# Patient Record
Sex: Female | Born: 1985 | Race: Black or African American | Hispanic: No | Marital: Married | Smoking: Former smoker
Health system: Southern US, Community
[De-identification: ages and names within clinical notes are randomized; demographics above are authoritative.]

## PROBLEM LIST (undated history)

## (undated) ENCOUNTER — Inpatient Hospital Stay (HOSPITAL_COMMUNITY): Payer: Self-pay

## (undated) DIAGNOSIS — O9989 Other specified diseases and conditions complicating pregnancy, childbirth and the puerperium: Secondary | ICD-10-CM

## (undated) DIAGNOSIS — F4310 Post-traumatic stress disorder, unspecified: Secondary | ICD-10-CM

## (undated) DIAGNOSIS — N390 Urinary tract infection, site not specified: Secondary | ICD-10-CM

## (undated) DIAGNOSIS — E876 Hypokalemia: Secondary | ICD-10-CM

## (undated) DIAGNOSIS — D649 Anemia, unspecified: Secondary | ICD-10-CM

## (undated) DIAGNOSIS — B9689 Other specified bacterial agents as the cause of diseases classified elsewhere: Secondary | ICD-10-CM

## (undated) DIAGNOSIS — F32A Depression, unspecified: Secondary | ICD-10-CM

## (undated) DIAGNOSIS — O99891 Other specified diseases and conditions complicating pregnancy: Secondary | ICD-10-CM

## (undated) DIAGNOSIS — F329 Major depressive disorder, single episode, unspecified: Secondary | ICD-10-CM

## (undated) DIAGNOSIS — A6000 Herpesviral infection of urogenital system, unspecified: Secondary | ICD-10-CM

## (undated) DIAGNOSIS — F419 Anxiety disorder, unspecified: Secondary | ICD-10-CM

## (undated) DIAGNOSIS — Z8659 Personal history of other mental and behavioral disorders: Secondary | ICD-10-CM

## (undated) DIAGNOSIS — N760 Acute vaginitis: Secondary | ICD-10-CM

## (undated) HISTORY — DX: Herpesviral infection of urogenital system, unspecified: A60.00

## (undated) HISTORY — DX: Personal history of other mental and behavioral disorders: Z86.59

## (undated) HISTORY — DX: Anxiety disorder, unspecified: F41.9

## (undated) HISTORY — DX: Post-traumatic stress disorder, unspecified: F43.10

## (undated) HISTORY — DX: Other specified diseases and conditions complicating pregnancy, childbirth and the puerperium: O99.89

## (undated) HISTORY — PX: RHINOPLASTY: SUR1284

## (undated) HISTORY — DX: Depression, unspecified: F32.A

## (undated) HISTORY — DX: Other specified diseases and conditions complicating pregnancy: O99.891

## (undated) HISTORY — DX: Major depressive disorder, single episode, unspecified: F32.9

## (undated) HISTORY — PX: MOUTH SURGERY: SHX715

---
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
## 2006-02-26 ENCOUNTER — Ambulatory Visit (HOSPITAL_BASED_OUTPATIENT_CLINIC_OR_DEPARTMENT_OTHER): Admission: RE | Admit: 2006-02-26 | Discharge: 2006-02-26 | Payer: Self-pay | Admitting: Otolaryngology

## 2008-07-30 ENCOUNTER — Emergency Department (HOSPITAL_BASED_OUTPATIENT_CLINIC_OR_DEPARTMENT_OTHER): Admission: EM | Admit: 2008-07-30 | Discharge: 2008-07-30 | Payer: Self-pay | Admitting: Emergency Medicine

## 2008-08-08 ENCOUNTER — Encounter: Admission: RE | Admit: 2008-08-08 | Discharge: 2008-08-08 | Payer: Self-pay | Admitting: Gastroenterology

## 2009-12-11 ENCOUNTER — Inpatient Hospital Stay (HOSPITAL_COMMUNITY): Admission: AD | Admit: 2009-12-11 | Discharge: 2009-12-11 | Payer: Self-pay | Admitting: Obstetrics and Gynecology

## 2010-01-11 ENCOUNTER — Inpatient Hospital Stay (HOSPITAL_COMMUNITY): Admission: AD | Admit: 2010-01-11 | Discharge: 2010-01-12 | Payer: Self-pay | Admitting: Obstetrics and Gynecology

## 2010-04-29 LAB — CBC
MCHC: 33.4 g/dL (ref 30.0–36.0)
MCV: 85.1 fL (ref 78.0–100.0)
Platelets: 191 10*3/uL (ref 150–400)
RBC: 4.23 MIL/uL (ref 3.87–5.11)
RDW: 13.8 % (ref 11.5–15.5)
WBC: 7 10*3/uL (ref 4.0–10.5)

## 2010-04-29 LAB — URINALYSIS, ROUTINE W REFLEX MICROSCOPIC
Ketones, ur: NEGATIVE mg/dL
Protein, ur: NEGATIVE mg/dL
Specific Gravity, Urine: 1.025 (ref 1.005–1.030)
pH: 6 (ref 5.0–8.0)

## 2010-04-29 LAB — URINE MICROSCOPIC-ADD ON

## 2010-04-30 LAB — URINE CULTURE

## 2010-04-30 LAB — URINALYSIS, ROUTINE W REFLEX MICROSCOPIC
Bilirubin Urine: NEGATIVE
Nitrite: NEGATIVE
Specific Gravity, Urine: 1.02 (ref 1.005–1.030)
Urobilinogen, UA: 0.2 mg/dL (ref 0.0–1.0)

## 2010-04-30 LAB — URINE MICROSCOPIC-ADD ON

## 2010-05-26 LAB — URINE MICROSCOPIC-ADD ON

## 2010-05-26 LAB — COMPREHENSIVE METABOLIC PANEL
Albumin: 4 g/dL (ref 3.5–5.2)
Alkaline Phosphatase: 83 U/L (ref 39–117)
Chloride: 108 mEq/L (ref 96–112)
Creatinine, Ser: 0.8 mg/dL (ref 0.4–1.2)
GFR calc Af Amer: >60 mL/min (ref >60)
GFR calc non Af Amer: >60 mL/min (ref >60)
Glucose, Bld: 86 mg/dL (ref 70–99)
Sodium: 143 mEq/L (ref 135–145)

## 2010-05-26 LAB — DIFFERENTIAL
Eosinophils Relative: 5 % (ref 0–5)
Lymphs Abs: 0.8 10*3/uL (ref 0.7–4.0)
Monocytes Absolute: 0.4 10*3/uL (ref 0.1–1.0)
Neutro Abs: 4.3 10*3/uL (ref 1.7–7.7)

## 2010-05-26 LAB — URINALYSIS, ROUTINE W REFLEX MICROSCOPIC
Glucose, UA: NEGATIVE mg/dL
Nitrite: NEGATIVE
pH: 6.5 (ref 5.0–8.0)

## 2010-05-26 LAB — CBC
HCT: 39.2 % (ref 36.0–46.0)
MCV: 81.4 fL (ref 78.0–100.0)
RBC: 4.82 MIL/uL (ref 3.87–5.11)
RDW: 13.5 % (ref 11.5–15.5)

## 2010-05-26 LAB — LIPASE, BLOOD: Lipase: 71 U/L (ref 23–300)

## 2010-05-26 LAB — URINE CULTURE

## 2010-06-26 ENCOUNTER — Inpatient Hospital Stay (HOSPITAL_COMMUNITY)
Admission: AD | Admit: 2010-06-26 | Discharge: 2010-06-28 | DRG: 775 | Disposition: A | Discharge status: Home / Self Care | Payer: PRIVATE HEALTH INSURANCE | Source: Ambulatory Visit | Attending: Obstetrics and Gynecology | Admitting: Obstetrics and Gynecology

## 2010-06-26 ENCOUNTER — Inpatient Hospital Stay (HOSPITAL_COMMUNITY)
Admission: AD | Admit: 2010-06-26 | Payer: PRIVATE HEALTH INSURANCE | Source: Ambulatory Visit | Admitting: Obstetrics and Gynecology

## 2010-06-26 LAB — CBC
MCH: 24.7 pg — ABNORMAL LOW (ref 26.0–34.0)
MCV: 76.5 fL — ABNORMAL LOW (ref 78.0–100.0)
Platelets: 240 10*3/uL (ref 150–400)
RBC: 4.13 MIL/uL (ref 3.87–5.11)
RDW: 14.1 % (ref 11.5–15.5)

## 2010-06-27 LAB — CBC
MCHC: 32 g/dL (ref 30.0–36.0)
Platelets: 213 10*3/uL (ref 150–400)
RDW: 14.2 % (ref 11.5–15.5)

## 2010-07-04 NOTE — Op Note (Signed)
[name], [name]             ACCOUNT NO.:  [id number]   MEDICAL RECORD NO.:  [id number]          PATIENT TYPE:  AMB   LOCATION:  DSC                          FACILITY:  [facility]   PHYSICIAN:  Christopher E. Ezzard Standing, M.D.DATE OF BIRTH:  02-04-1986   DATE OF PROCEDURE:  DATE OF DISCHARGE:  02/26/2006                               OPERATIVE REPORT   PREOPERATIVE DIAGNOSIS:  Septal deviation with nasal obstruction.   POSTOPERATIVE DIAGNOSIS:  Septal deviation with nasal obstruction.   OPERATION:  Septoplasty and bilateral inferior turbinate reductions.   SURGEON:  Dillard Cannon, MD   ANESTHESIA:  General endotracheal.   COMPLICATIONS:  None.   BRIEF CLINICAL NOTE:  Breanna [name] is a 25 year old who has had  chronic problems with nasal obstruction, left side generally worse than  right.  On exam she has deviated septum and moderate-sized turbinates  causing obstruction, generally worse on the left side.  She was taken to  the operating room and signed for septoplasty and turbinate reductions.   PROCEDURE:  After adequate general endotracheal anesthesia, the nose was  prepped and draped out with sterile towels.  The nose was then further  prepped with cotton pledgets soaked in decongestant and then the septum  and the turbinates were injected with Xylocaine with epinephrine.  A  hemitransfixion incision was made along the caudal edge of the septum on  the right side.  Mucoperichondrial and mucoperiosteal flaps were  elevated posteriorly.  The portion of the caudal edges of the bony  septum were protruded to the left side and were shifted back to middle  after resecting the inferior portion of the caudal anteroseptum off of  the maxillary crest.  In addition, bony spur was removed from the  posterior left side.  This completed the septoplasty portion of the  procedure.  Hemitransfixion incision was closed with 3-0 chromic sutures  and the septum was __________ with a 3-0  chromic suture.  Next, inferior  turbinate reductions were performed by amputating the lower one-third of  turbinate, mucosa, and tissue.  Additional turbinate bone was removed  and then suction cautery was used for hemostasis.  The nose was packed  with Telfa soaked in bacitracin ointment bilaterally.  The patient was  subsequently awoke from anesthesia and transferred to the recovery doing  well.  The patient received 1 gm of Ancef IV preoperatively.   DISPOSITION:  [name] is discharged home later this morning on Keflex 500  mg b.i.d. for one week, Tylenol and Vicodin p.r.n. pain.  Will have her  follow up in my office tomorrow to have her nasal packs removed.           ______________________________  Kristine Garbe Ezzard Standing, M.D.    CEN/MEDQ  D:  03/24/2006  T:  03/25/2006  Job:  [id number]

## 2010-07-04 NOTE — H&P (Signed)
NAME:  Breanna [name], Breanna [name] NO.:  [id number]   MEDICAL RECORD NO.:  [id number]           PATIENT TYPE:   LOCATION:                                 FACILITY:   PHYSICIAN:  Juluis Mire, M.D.        DATE OF BIRTH:   DATE OF ADMISSION:  DATE OF DISCHARGE:                                HISTORY & PHYSICAL   HISTORY:  The patient is an 25 year old prima gravida female with her last  menstrual period October 15, 2003, giving her an estimated date of  confinement of July 21, 2004.  This gives her an estimated gestational age of  [redacted] weeks.  However, by early ultrasound she has an estimated date of  confinement of August 02, 2004 and this gives her an estimated gestational age  of [redacted] weeks and 3 days.   In relation of the present admission the patient was seen in the office.  She had a history of diarrhea and vomiting for two weeks.  She was unable to  keep bland foods down.  She has also had some severe back pain over the  weekend.  She had been using Darvocet.  It was questioned whether she was  having uterine activity.  Dr. Henderson Cloud checked her.  Her cervix was closed  but it was very soft and she had a tight lower uterine segment.  She was  sent to triage for an ultrasound, as well as nonstress testing and  monitoring.  During the ultrasound it was noted that she had an amniotic  fluid index of 7.4, which was low.  Biophysical profile was 8/8.  She had  normal Doppler flow studies.  The growth did reveal some questionable  asymmetrical growth retardation.  Her cervical length was fairly normal by  ultrasound.   On monitoring she had a reactive nonstress test and there was no evidence of  uterine activity.  No leaking was specifically noted.  In view of the  decreased amniotic fluid and the question of asymmetric growth retardation,  the patient is now admitted for further management.  This will include  hospitalization with relative bedrest and hydration.   ALLERGIES:  No  known drug allergies.   MEDICATIONS:  Include prenatal vitamins.   PAST MEDICAL HISTORY:   FAMILY HISTORY:   SOCIAL HISTORY:  Please see the prenatal records.   REVIEW OF SYSTEMS:  Noncontributory.   PHYSICAL EXAMINATION:  VITAL SIGNS:  The patient is afebrile.  Blood  pressure 100/54.  The vital signs are stable.  LUNGS:  Clear.  CARDIAC:  Regular rhythm and rate without murmurs or gallops.  ABDOMEN:  Gravid uterus with a fundal height of 32 cm.  Fetal heart tones  are audible.  PELVIC:  In the office the cervix was closed.  Very soft.  She had a soft  lower uterine segment.  The cervix was posterior.  Evidently, by Dr.  Huel Coventry check, no amniotic fluid leakage was noted.  EXTREMITIES:  Trace edema.  NEUROLOGIC:  Grossly within normal limits.  Deep tendon reflexes 2+ and no  clonus.   IMPRESSION:  Intrauterine pregnancy between 32-34 weeks with decreased  amniotic fluid volume and questionable asymmetric growth retardation.   PLAN:  The patient will be admitted for IV hydration.  We will do bedrest  with bathroom privileges.  Continuous monitoring will be undertaken.  A  repeat biophysical profile and amniotic fluid volume will be reassessed  tomorrow.      JSM/MEDQ  D:  06/10/2004  T:  06/10/2004  Job:  [id number]

## 2010-07-04 NOTE — Op Note (Signed)
Breanna [name], Breanna [name]             ACCOUNT NO.:  [id number]   MEDICAL RECORD NO.:  [id number]          PATIENT TYPE:  AMB   LOCATION:  DSC                          FACILITY:  [facility]   PHYSICIAN:  Christopher E. Ezzard Standing, M.D.DATE OF BIRTH:  May 18, 1985   DATE OF PROCEDURE:  02/26/2006  DATE OF DISCHARGE:                               OPERATIVE REPORT   PREOPERATIVE DIAGNOSES:  1. 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  2. Turbinate hypertrophy.   POSTOPERATIVE DIAGNOSES:  1. 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  2. Turbinate hypertrophy.   OPERATION:  Septoplasty with bilateral inferior turbinate reductions.   SURGEON:  Kristine Garbe. Ezzard Standing, M.D.   ANESTHESIA:  General endotracheal.   COMPLICATIONS:  None.   CLINICAL NOTE:  Breanna [name] is a 25 year old who has had a chronic problem  with nasal obstruction, the left side much worse than the right.  On  examination she has almost total blockage of the left nasal passageway  with compensatory turbinate hypertrophy of the right inferior turbinate.  She has a severe bony as well as cartilaginous deviation of the septum  to left with the caudal end of the cartilaginous septum bone into the  right nasal passageway.  She is taken to the operating room at this time  for septoplasty and turbinate reductions.  The left airway is generally  much worse than her right.   DESCRIPTION OF PROCEDURE:  After adequate endotracheal anesthesia, the  patient received 1 g of Ancef intraoperatively.  The nose was prepped  with Betadine solution and draped out with sterile towels.  A  hemitransfixion incision was made along the caudal edge of septum on the  right side.  Mucoperiosteal and mucoperichondrial flaps were elevated on  the left side of the septum as the cartilaginous septum was off of the  crest into the left airway and at the junction of the bony cartilaginous  septum, the bony septum was severely deviated to the left.  After  removing the strip of cartilage off of the maxillary crest inferiorly  and separating the cartilaginous septum from the bony septum, the  anterior cartilaginous septum straightened up reasonably well.  The bony  septum that protruded into the left airway was removed.  There was a  large spur or crest posteriorly on the left side, and this was likewise  removed.  This allowed the septum to return much more toward midline and  open up the left nasal passageway.  Because of the compensatory  turbinate urgency of the right inferior turbinate, this had to be  reduced.  An incision was made along the inferior edge of the turbinate.  Mucosa was elevated off of the turbinate bone and the turbinate bone was  removed, submucosal cauterization was performed, and the remaining  turbinate was outfractured.  On the left side just the inferior one-  third of the turbinate was amputated and remaining turbinate was  submucosally cauterized with bipolar cautery.  This completed the  turbinate reductions.  The hemitransfixion incision was  closed with  interrupted 4-0 chromic sutures.  The septum was basted with a 3-0  chromic suture and splints were secured to either side of the septum  with a 3-0 nylon suture.  The nose was then packed with Telfa soaked in  bacitracin ointment.  Having tolerated this well, was awoken from  anesthesia and transferred to the recovery room postop doing well.   DISPOSITION:  Breanna [name] is discharged home later this morning on Tylenol  and Vicodin p.r.n. pain, Keflex 500 mg b.i.d. for 1 week.  We will have  her follow up in my office Monday to have the nasal packs removed.           ______________________________  Kristine Garbe Ezzard Standing, M.D.     CEN/MEDQ  D:  02/26/2006  T:  02/26/2006  Job:  [id number]

## 2010-07-04 NOTE — Discharge Summary (Signed)
NAMESIMCHA, [name]             ACCOUNT NO.:  [id number]   MEDICAL RECORD NO.:  [id number]          PATIENT TYPE:  INP   LOCATION:  9158                          FACILITY:  [facility]   PHYSICIAN:  Freddy Finner, M.D.   DATE OF BIRTH:  05-27-1985   DATE OF ADMISSION:  06/10/2004  DATE OF DISCHARGE:  06/11/2004                                 DISCHARGE SUMMARY   ADMITTING DIAGNOSES:  1.  Intrauterine pregnancy at 49 weeks estimated gestational age.  2.  Viral syndrome.  3.  Decreased amniotic fluid index.  4.  Questionable intrauterine growth retardation with asymmetrical growth.   DISCHARGE DIAGNOSES:  1.  Intrauterine pregnancy at 47 weeks estimated gestational age.  2.  Viral syndrome, resolved.   REASON FOR ADMISSION:  Please see dictated H&P.   HOSPITAL COURSE:  The patient was an 25 year old primigravida black female  that was admitted to [facility] for observation. The  patient had been seen in the office on the day of admission with a history  of diarrhea and vomiting for approximately 2 weeks. The patient had been  unable to keep down bland foods. The patient also had a complaint of some  severe back pain over the weekend. It was questioned as to whether or not  she was experiencing some uterine activity. Cervix was examined which was  noted to be closed; however, it was very soft and she had a tight lower  uterine segment. The patient was sent to maternity admissions for further  observation and testing. During the ultrasound, it was noted that the  patient had decreased amniotic fluid with an AFI of 7.4. Biophysical profile  was 8/8. She had normal Doppler flow studies. The growth also had revealed  some questionable asymmetric growth retardation. Cervix was noted to have  good length by ultrasound. On monitoring, the patient did have a reactive  nonstress test without evidence of uterine activity. There was no leaking of  amniotic fluid. The decision was  made to hospitalize the patient overnight  with further testing and observation. The patient was started on IV  hydration and bedrest. She was scheduled for repeat ultrasound the following  morning and amniotic fluid volume to be reassessed. On the following morning  it was determined that fetal heart tones were reactive. Biophysical profile  was 8/8. There was an increase in the amniotic fluid index greater than 5%.  Decision was made to discharge the patient with a follow-up in the office in  approximately 1 week.   CONDITION ON DISCHARGE:  Stable.   DIET:  Regular as tolerated.   ACTIVITY:  Moderate bedrest.   FOLLOW UP:  The patient to follow up in the office in 1 week. She is to call  for decrease in fetal movement, leakage of amniotic fluid, uterine  contractions, or pelvic pressure.   DISCHARGE MEDICATIONS:  Prenatal vitamins one p.o. daily.      CC/MEDQ  D:  07/04/2004  T:  07/04/2004  Job:  [id number]

## 2010-12-09 ENCOUNTER — Inpatient Hospital Stay (INDEPENDENT_AMBULATORY_CARE_PROVIDER_SITE_OTHER)
Admission: RE | Admit: 2010-12-09 | Discharge: 2010-12-09 | Disposition: A | Discharge status: Home / Self Care | Payer: PRIVATE HEALTH INSURANCE | Source: Ambulatory Visit | Attending: Family Medicine | Admitting: Family Medicine

## 2010-12-09 DIAGNOSIS — R197 Diarrhea, unspecified: Secondary | ICD-10-CM

## 2010-12-09 DIAGNOSIS — R112 Nausea with vomiting, unspecified: Secondary | ICD-10-CM

## 2012-02-16 ENCOUNTER — Emergency Department (HOSPITAL_BASED_OUTPATIENT_CLINIC_OR_DEPARTMENT_OTHER)
Admission: EM | Admit: 2012-02-16 | Discharge: 2012-02-16 | Discharge status: Against Medical Advice | Payer: Self-pay | Attending: Emergency Medicine | Admitting: Emergency Medicine

## 2012-02-16 ENCOUNTER — Encounter (HOSPITAL_BASED_OUTPATIENT_CLINIC_OR_DEPARTMENT_OTHER): Payer: Self-pay

## 2012-02-16 ENCOUNTER — Emergency Department (HOSPITAL_BASED_OUTPATIENT_CLINIC_OR_DEPARTMENT_OTHER): Payer: Self-pay

## 2012-02-16 ENCOUNTER — Other Ambulatory Visit (HOSPITAL_BASED_OUTPATIENT_CLINIC_OR_DEPARTMENT_OTHER): Payer: PRIVATE HEALTH INSURANCE

## 2012-02-16 DIAGNOSIS — Z3490 Encounter for supervision of normal pregnancy, unspecified, unspecified trimester: Secondary | ICD-10-CM

## 2012-02-16 DIAGNOSIS — R350 Frequency of micturition: Secondary | ICD-10-CM | POA: Insufficient documentation

## 2012-02-16 DIAGNOSIS — B9689 Other specified bacterial agents as the cause of diseases classified elsewhere: Secondary | ICD-10-CM

## 2012-02-16 DIAGNOSIS — R1110 Vomiting, unspecified: Secondary | ICD-10-CM | POA: Insufficient documentation

## 2012-02-16 DIAGNOSIS — N760 Acute vaginitis: Secondary | ICD-10-CM | POA: Insufficient documentation

## 2012-02-16 DIAGNOSIS — R109 Unspecified abdominal pain: Secondary | ICD-10-CM | POA: Insufficient documentation

## 2012-02-16 DIAGNOSIS — N390 Urinary tract infection, site not specified: Secondary | ICD-10-CM | POA: Insufficient documentation

## 2012-02-16 DIAGNOSIS — N898 Other specified noninflammatory disorders of vagina: Secondary | ICD-10-CM | POA: Insufficient documentation

## 2012-02-16 DIAGNOSIS — O9989 Other specified diseases and conditions complicating pregnancy, childbirth and the puerperium: Secondary | ICD-10-CM | POA: Insufficient documentation

## 2012-02-16 LAB — URINALYSIS, ROUTINE W REFLEX MICROSCOPIC
Bilirubin Urine: NEGATIVE
Glucose, UA: NEGATIVE mg/dL
Nitrite: NEGATIVE
Protein, ur: NEGATIVE mg/dL
Specific Gravity, Urine: 1.017 (ref 1.005–1.030)
Urobilinogen, UA: 0.2 mg/dL (ref 0.0–1.0)
pH: 8 (ref 5.0–8.0)

## 2012-02-16 LAB — WET PREP, GENITAL: Yeast Wet Prep HPF POC: NONE SEEN

## 2012-02-16 LAB — URINE MICROSCOPIC-ADD ON

## 2012-02-16 LAB — PREGNANCY, URINE: Preg Test, Ur: POSITIVE — AB

## 2012-02-16 NOTE — ED Notes (Signed)
Pt reports LMP Nov 15, had positive pregnancy test on Dec 15, had heavy bleeding on the 21st had heavy bleeding x 1 day. Reports today having left sided flank pain with vaginal discharge- white mucus like. Reports pressure, frequency with urination. Has had two previous pregnancies, carried to term.

## 2012-02-16 NOTE — ED Provider Notes (Signed)
History/physical exam/procedure(s) were performed by non-physician practitioner and as supervising physician I was immediately available for consultation/collaboration. I have reviewed all notes and am in agreement with care and plan.   Hilario Quarry, MD 02/16/12 [phone]

## 2012-02-16 NOTE — ED Notes (Signed)
Pelvic cart is set up at the bedside and ready for the doctor to use.

## 2012-02-16 NOTE — ED Notes (Signed)
Per Langston Masker, PA instructions-call pt and advise of need for rx and call in rx x 2-spoke with pt-pt advised of need for rx x 2 for UTI and BV-pt requested rx to be called into [facility] on [address]-pt also states she plans to f/u at [facility]-rx amoxil 500mg  tid #30 and flagyl 500mg  bid #20 with no refills called into requested pharmacy

## 2012-02-16 NOTE — ED Provider Notes (Addendum)
History     CSN: [id number]  Arrival date & time 02/16/12  1213   First MD Initiated Contact with Patient 02/16/12 1258      Chief Complaint  Patient presents with  . Vaginal Discharge  . Urinary Frequency    (Consider location/radiation/quality/duration/timing/severity/associated sxs/prior treatment) Patient is a 26 y.o. female presenting with vaginal discharge. The history is provided by the patient. No language interpreter was used.  Vaginal Discharge This is a new problem. The current episode started in the past 7 days. The problem occurs constantly. The problem has been gradually worsening. Associated symptoms include abdominal pain and vomiting. Nothing aggravates the symptoms. She has tried nothing for the symptoms. The treatment provided moderate relief.  Pt complains of vaginal discharge and possible pregnancy  History reviewed. No pertinent past medical history.  Past Surgical History  Procedure Date  . Rhinoplasty   . Mouth surgery     No family history on file.  History  Substance Use Topics  . Smoking status: Never Smoker   . Smokeless tobacco: Never Used  . Alcohol Use: No    OB History    Grav Para Term Preterm Abortions TAB SAB Ect Mult Living                  Review of Systems  Gastrointestinal: Positive for vomiting and abdominal pain.  Genitourinary: Positive for vaginal discharge.  All other systems reviewed and are negative.    Allergies  Review of patient's allergies indicates no known allergies.  Home Medications  No current outpatient prescriptions on file.  BP 105/63  Pulse 77  Temp 99.2 F (37.3 C) (Oral)  Resp 16  Ht 5' 4.5" (1.638 m)  Wt 109 lb (49.442 kg)  BMI 18.42 kg/m2  SpO2 100%  LMP 01/01/2012  Physical Exam  Nursing note and vitals reviewed. Constitutional: She is oriented to person, place, and time. She appears well-developed and well-nourished.  HENT:  Head: Normocephalic.  Eyes: Pupils are equal, round,  and reactive to light.  Neck: Normal range of motion. Neck supple.  Cardiovascular: Normal rate and normal heart sounds.   Pulmonary/Chest: Effort normal.  Abdominal: Soft.  Genitourinary: Vaginal discharge found.  Musculoskeletal: Normal range of motion.  Neurological: She is alert and oriented to person, place, and time.  Skin: Skin is warm.  Psychiatric: She has a normal mood and affect.    ED Course  Procedures (including critical care time)  Labs Reviewed  PREGNANCY, URINE - Abnormal; Notable for the following:    Preg Test, Ur POSITIVE (*)     All other components within normal limits  URINALYSIS, ROUTINE W REFLEX MICROSCOPIC - Abnormal; Notable for the following:    Leukocytes, UA MODERATE (*)     All other components within normal limits  URINE MICROSCOPIC-ADD ON - Abnormal; Notable for the following:    Squamous Epithelial / LPF FEW (*)     Bacteria, UA MANY (*)     All other components within normal limits  URINE CULTURE  WET PREP, GENITAL  GC/CHLAMYDIA PROBE AMP   No results found.   1. UTI (lower urinary tract infection)   2. BV (bacterial vaginosis)   3. Pregnancy       MDM  Urine preg positive,  Wet prep many clue and wbc's tntc   Pt left ama.  We will atempt to contact to treat for BV and uti.   RN was able to reach pt and call in antibiotics.  Pt  will follow up at [facility]     Lonia Skinner [place], [place] 02/16/12 1455  [address] [place], [place] 02/16/12 1534  Lonia Skinner [place], [place] 02/16/12 2115  Lonia Skinner [place], [place] 02/16/12 2116

## 2012-02-16 NOTE — ED Notes (Signed)
Ultrasound tech came to get pt for ultrasound, pt not found in room- gown on bed and no belongings found. Pt no where to be found. Charge nurse and MD made aware.

## 2012-02-16 NOTE — ED Notes (Signed)
Pt reports vaginal d/c that started Saturday associated with urinary frequency.

## 2012-02-17 LAB — URINE CULTURE
Colony Count: NO GROWTH
Culture: NO GROWTH

## 2012-02-17 NOTE — L&D Delivery Note (Signed)
Delivery Note At 5:02 PM a viable female was delivered via Vaginal, Spontaneous Delivery (Presentation: Left Occiput Anterior) in a waterbirth tub.  APGAR: 8, 9; weight pending.   Placenta status: Intact Abnormal, Spontaneous.  Cord: 3 vessels with the following complications: Velamentous.  Cord pH: NA  Anesthesia: None  Episiotomy: None Lacerations: None Suture Repair: NA Est. Blood Loss (mL): 500.  Mom to postpartum.  Baby to nursery-stable. Placenta to: Home w/ pt. Feeding: Breast Circ: No Contraception: Undecided.  <4 hours GBS prophylaxis.   Dorathy Kinsman 09/25/2012, 6:15 PM

## 2012-02-17 NOTE — ED Provider Notes (Signed)
History/physical exam/procedure(s) were performed by non-physician practitioner and as supervising physician I was immediately available for consultation/collaboration. I have reviewed all notes and am in agreement with care and plan.   Hilario Quarry, MD 02/17/12 [phone]

## 2012-02-26 ENCOUNTER — Encounter (HOSPITAL_COMMUNITY): Payer: Self-pay | Admitting: *Deleted

## 2012-02-26 ENCOUNTER — Inpatient Hospital Stay (HOSPITAL_COMMUNITY)
Admission: AD | Admit: 2012-02-26 | Discharge: 2012-02-26 | Disposition: A | Discharge status: Home / Self Care | Payer: PRIVATE HEALTH INSURANCE | Source: Ambulatory Visit | Attending: Obstetrics and Gynecology | Admitting: Obstetrics and Gynecology

## 2012-02-26 ENCOUNTER — Inpatient Hospital Stay (HOSPITAL_COMMUNITY): Payer: PRIVATE HEALTH INSURANCE

## 2012-02-26 DIAGNOSIS — O200 Threatened abortion: Secondary | ICD-10-CM

## 2012-02-26 DIAGNOSIS — O219 Vomiting of pregnancy, unspecified: Secondary | ICD-10-CM

## 2012-02-26 DIAGNOSIS — O468X9 Other antepartum hemorrhage, unspecified trimester: Secondary | ICD-10-CM

## 2012-02-26 HISTORY — DX: Urinary tract infection, site not specified: N39.0

## 2012-02-26 HISTORY — DX: Other specified bacterial agents as the cause of diseases classified elsewhere: B96.89

## 2012-02-26 HISTORY — DX: Acute vaginitis: N76.0

## 2012-02-26 LAB — URINALYSIS, ROUTINE W REFLEX MICROSCOPIC
Glucose, UA: NEGATIVE mg/dL
Leukocytes, UA: NEGATIVE
Protein, ur: NEGATIVE mg/dL
Specific Gravity, Urine: >1.03 — ABNORMAL HIGH (ref 1.005–1.030)
pH: 6 (ref 5.0–8.0)

## 2012-02-26 MED ORDER — PROMETHAZINE HCL 25 MG/ML IJ SOLN
25.0000 mg | Freq: Once | INTRAVENOUS | Status: DC
  Filled 2012-02-26: qty 1

## 2012-02-26 MED ORDER — PROMETHAZINE HCL 25 MG PO TABS: 25.0000 mg | ORAL_TABLET | Freq: Four times a day (QID) | ORAL | Status: DC | PRN

## 2012-02-26 MED ORDER — PROMETHAZINE HCL 25 MG PO TABS
25.0000 mg | ORAL_TABLET | Freq: Once | ORAL | Status: AC
  Administered 2012-02-26: 25 mg via ORAL
  Filled 2012-02-26: qty 1

## 2012-02-26 NOTE — MAU Note (Signed)
Patient is in to verify if she is still pregnant and due to nausea. Patient states that is not bleeding or having abdominal pain today. She states that she was told the she have BV and UTI but have not started taking the meds due to nausea.

## 2012-02-26 NOTE — MAU Provider Note (Signed)
Chief Complaint: Morning Sickness and Possible Pregnancy   First Provider Initiated Contact with Patient 02/26/12 2059     SUBJECTIVE HPI: Breanna [name] is a 27 y.o. [id number] at [redacted]w[redacted]d by LMP who presents with concern that she may have miscarried and severe nausea. Patient's last menstrual period was 01/01/2012. She states she had heavy bleeding and severe cramping 02/06/12. Seen  At [facility] 02/16/12 for vaginal discharge, urinary frequency, possible SAB. UPT pos. Dx BV and UTI (culture neg). Left AMA. Did not take Rx due to nausea. No pain or bleeding since. Plans to start prenatal care at [facility].   Past Medical History  Diagnosis Date  . UTI (lower urinary tract infection)   . BV (bacterial vaginosis)    OB History    Grav Para Term Preterm Abortions TAB SAB Ect Mult Living   4 2 2  1 1    2      # Outc Date GA Lbr Len/2nd Wgt Sex Del Anes PTL Lv   1 TRM            2 TRM            3 TAB            4 CUR              Past Surgical History  Procedure Date  . Rhinoplasty   . Mouth surgery    History   Social History  . Marital Status: Single    Spouse Name: N/A    Number of Children: N/A  . Years of Education: N/A   Occupational History  . Not on file.   Social History Main Topics  . Smoking status: Current Some Day Smoker    Types: Cigarettes  . Smokeless tobacco: Never Used  . Alcohol Use: No  . Drug Use: No  . Sexually Active: Yes    Birth Control/ Protection: None   Other Topics Concern  . Not on file   Social History Narrative  . No narrative on file   No current facility-administered medications on file prior to encounter.   Current Outpatient Prescriptions on File Prior to Encounter  Medication Sig Dispense Refill  . promethazine (PHENERGAN) 25 MG tablet Take 1 tablet (25 mg total) by mouth every 6 (six) hours as needed for nausea.  30 tablet  1   No Known Allergies  ROS: Pertinent items in HPI  OBJECTIVE Blood pressure  117/63, pulse 81, temperature 97.9 F (36.6 C), resp. rate 20, height 5' 4.5" (1.638 m), weight 49.624 kg (109 lb 6.4 oz), last menstrual period 01/01/2012. GENERAL: Well-developed, well-nourished female in no acute distress.  HEENT: Normocephalic. Mucus membranes moist HEART: normal rate RESP: normal effort ABDOMEN: Soft, non-tender EXTREMITIES: Nontender, no edema NEURO: Alert and oriented SPECULUM EXAM: deferred  LAB RESULTS Results for orders placed during the hospital encounter of 02/26/12 (from the past 24 hour(s))  URINALYSIS, ROUTINE W REFLEX MICROSCOPIC     Status: Abnormal   Collection Time   02/26/12  8:30 PM      Component Value Range   Color, Urine YELLOW  YELLOW   APPearance CLEAR  CLEAR   Specific Gravity, Urine >1.030 (*) 1.005 - 1.030   pH 6.0  5.0 - 8.0   Glucose, UA NEGATIVE  NEGATIVE mg/dL   Hgb urine dipstick NEGATIVE  NEGATIVE   Bilirubin Urine NEGATIVE  NEGATIVE   Ketones, ur NEGATIVE  NEGATIVE mg/dL   Protein, ur NEGATIVE  NEGATIVE mg/dL   Urobilinogen, UA 0.2  0.0 - 1.0 mg/dL   Nitrite NEGATIVE  NEGATIVE   Leukocytes, UA NEGATIVE  NEGATIVE    IMAGING No results found.  MAU COURSE Nausea improved w/ phenergan. No vomiting in MAU. Tolerating PO's.   ASSESSMENT 1. Threatened abortion in first trimester   2. Nausea and vomiting of pregnancy, antepartum   3. Subchorionic hemorrhage of placenta    PLAN Discharge home Bleeding precautions. Pregnancy verification letter given.     Follow-up Information    Schedule an appointment as soon as possible for a visit with Turner Daniels, MD.   Contact information:   [address] [place], [address] [place] [address] [phone]       Follow up with [facility] ADMISS[facility]. (As needed if symptoms worsen)    Contact information:   [address] [id number] mc [place] [address] [phone]          Medication List     As of 02/26/2012 11:05  PM    TAKE these medications         prenatal multivitamin Tabs   Take 1 tablet by mouth daily.      promethazine 25 MG tablet   Commonly known as: PHENERGAN   Take 1 tablet (25 mg total) by mouth every 6 (six) hours as needed for nausea.        [place], CNM 02/26/2012  11:05 PM

## 2012-02-26 NOTE — MAU Note (Signed)
Had a positive pregnancy test 12/15. Have had some bleeding earlier on 12/21 but none now. No pain now. Seen at [facility] 12/31 for left sided pain. Here tonight for nausea and unsure if still pregnant

## 2012-03-16 LAB — OB RESULTS CONSOLE HGB/HCT, BLOOD
HCT: 35 %
Hemoglobin: 12.1 g/dL

## 2012-03-16 LAB — OB RESULTS CONSOLE ABO/RH: RH Type: POSITIVE

## 2012-05-18 ENCOUNTER — Inpatient Hospital Stay (HOSPITAL_COMMUNITY)
Admission: AD | Admit: 2012-05-18 | Discharge: 2012-05-18 | Disposition: A | Discharge status: Home / Self Care | Payer: Medicaid Other | Source: Ambulatory Visit | Attending: Obstetrics and Gynecology | Admitting: Obstetrics and Gynecology

## 2012-05-18 ENCOUNTER — Encounter (HOSPITAL_COMMUNITY): Payer: Self-pay | Admitting: *Deleted

## 2012-05-18 DIAGNOSIS — R197 Diarrhea, unspecified: Secondary | ICD-10-CM | POA: Insufficient documentation

## 2012-05-18 DIAGNOSIS — A084 Viral intestinal infection, unspecified: Secondary | ICD-10-CM

## 2012-05-18 DIAGNOSIS — N390 Urinary tract infection, site not specified: Secondary | ICD-10-CM | POA: Insufficient documentation

## 2012-05-18 DIAGNOSIS — A088 Other specified intestinal infections: Secondary | ICD-10-CM | POA: Insufficient documentation

## 2012-05-18 DIAGNOSIS — O210 Mild hyperemesis gravidarum: Secondary | ICD-10-CM | POA: Insufficient documentation

## 2012-05-18 DIAGNOSIS — O2342 Unspecified infection of urinary tract in pregnancy, second trimester: Secondary | ICD-10-CM

## 2012-05-18 DIAGNOSIS — O2390 Unspecified genitourinary tract infection in pregnancy, unspecified trimester: Secondary | ICD-10-CM | POA: Insufficient documentation

## 2012-05-18 DIAGNOSIS — O99891 Other specified diseases and conditions complicating pregnancy: Secondary | ICD-10-CM | POA: Insufficient documentation

## 2012-05-18 LAB — CBC
Hemoglobin: 11.3 g/dL — ABNORMAL LOW (ref 12.0–15.0)
MCHC: 33.5 g/dL (ref 30.0–36.0)
WBC: 6.8 10*3/uL (ref 4.0–10.5)

## 2012-05-18 LAB — URINE MICROSCOPIC-ADD ON

## 2012-05-18 LAB — URINALYSIS, ROUTINE W REFLEX MICROSCOPIC
Ketones, ur: 15 mg/dL — AB
Nitrite: NEGATIVE
Specific Gravity, Urine: >1.03 — ABNORMAL HIGH (ref 1.005–1.030)
pH: 6 (ref 5.0–8.0)

## 2012-05-18 LAB — COMPREHENSIVE METABOLIC PANEL
ALT: 10 U/L (ref 0–35)
Albumin: 2.8 g/dL — ABNORMAL LOW (ref 3.5–5.2)
Alkaline Phosphatase: 81 U/L (ref 39–117)
GFR calc Af Amer: >90 mL/min (ref >90)
Glucose, Bld: 77 mg/dL (ref 70–99)
Potassium: 3.4 mEq/L — ABNORMAL LOW (ref 3.5–5.1)
Sodium: 134 mEq/L — ABNORMAL LOW (ref 135–145)
Total Protein: 6.7 g/dL (ref 6.0–8.3)

## 2012-05-18 MED ORDER — SODIUM CHLORIDE 0.9 % IV SOLN
25.0000 mg | Freq: Once | INTRAVENOUS | Status: AC
  Administered 2012-05-18: 25 mg via INTRAVENOUS
  Filled 2012-05-18: qty 1

## 2012-05-18 MED ORDER — ONDANSETRON HCL 4 MG/2ML IJ SOLN
4.0000 mg | INTRAMUSCULAR | Status: AC
  Administered 2012-05-18: 4 mg via INTRAVENOUS
  Filled 2012-05-18: qty 2

## 2012-05-18 MED ORDER — ONDANSETRON 4 MG PO TBDP: 4.0000 mg | ORAL_TABLET | Freq: Four times a day (QID) | ORAL | Status: DC | PRN

## 2012-05-18 MED ORDER — AMOXICILLIN 500 MG PO CAPS: 500.0000 mg | ORAL_CAPSULE | Freq: Two times a day (BID) | ORAL | Status: DC

## 2012-05-18 NOTE — MAU Provider Note (Signed)
Chief Complaint: Emesis and Diarrhea   First Provider Initiated Contact with Patient 05/18/12 0931     SUBJECTIVE HPI: Breanna [name] is a 27 y.o. [id number] at [redacted]w[redacted]d by LMP who presents to maternity admissions reporting nausea, vomiting, and diarrhea x24 hours.  Her sister also had diarrhea yesterday, but no other symptoms.  She reports fetal movement, denies LOF, vaginal bleeding, vaginal itching/burning, urinary symptoms, h/a, dizziness, or fever/chills.     Past Medical History  Diagnosis Date  . UTI (lower urinary tract infection)   . BV (bacterial vaginosis)    Past Surgical History  Procedure Laterality Date  . Rhinoplasty    . Mouth surgery     History   Social History  . Marital Status: Single    Spouse Name: N/A    Number of Children: N/A  . Years of Education: N/A   Occupational History  . Not on file.   Social History Main Topics  . Smoking status: Former Smoker    Types: Cigarettes  . Smokeless tobacco: Never Used  . Alcohol Use: No  . Drug Use: No  . Sexually Active: Yes    Birth Control/ Protection: None   Other Topics Concern  . Not on file   Social History Narrative  . No narrative on file   No current facility-administered medications on file prior to encounter.   Current Outpatient Prescriptions on File Prior to Encounter  Medication Sig Dispense Refill  . Prenatal Vit-Fe Fumarate-FA (PRENATAL MULTIVITAMIN) TABS Take 1 tablet by mouth daily.      . promethazine (PHENERGAN) 25 MG tablet Take 1 tablet (25 mg total) by mouth every 6 (six) hours as needed for nausea.  30 tablet  1   Allergies  Allergen Reactions  . Latex Itching    Vaginal irritation    ROS: Pertinent items in HPI  OBJECTIVE Blood pressure 117/67, pulse 104, temperature 97.8 F (36.6 C), temperature source Oral, resp. rate 18, height 5' 3.5" (1.613 m), last menstrual period 01/01/2012. GENERAL: Well-developed, well-nourished female in no acute distress.  HEENT:  Normocephalic HEART: normal rate RESP: normal effort ABDOMEN: Soft, non-tender EXTREMITIES: Nontender, no edema NEURO: Alert and oriented SPECULUM EXAM: Deferred  LAB RESULTS Results for orders placed during the hospital encounter of 05/18/12 (from the past 168 hour(s))  URINE CULTURE   Collection Time    05/18/12  9:30 AM      Result Value Range   Specimen Description URINE, CLEAN CATCH     Special Requests NONE     Culture  Setup Time 05/18/2012 15:24     Colony Count 7,000 COLONIES/ML     Culture INSIGNIFICANT GROWTH     Report Status 05/19/2012 FINAL    URINALYSIS, ROUTINE W REFLEX MICROSCOPIC   Collection Time    05/18/12  9:30 AM      Result Value Range   Color, Urine YELLOW  YELLOW   APPearance HAZY (*) CLEAR   Specific Gravity, Urine >1.030 (*) 1.005 - 1.030   pH 6.0  5.0 - 8.0   Glucose, UA NEGATIVE  NEGATIVE mg/dL   Hgb urine dipstick NEGATIVE  NEGATIVE   Bilirubin Urine NEGATIVE  NEGATIVE   Ketones, ur 15 (*) NEGATIVE mg/dL   Protein, ur NEGATIVE  NEGATIVE mg/dL   Urobilinogen, UA 4.0 (*) 0.0 - 1.0 mg/dL   Nitrite NEGATIVE  NEGATIVE   Leukocytes, UA MODERATE (*) NEGATIVE  URINE MICROSCOPIC-ADD ON   Collection Time    05/18/12  9:30 AM  Result Value Range   Squamous Epithelial / LPF RARE  RARE   WBC, UA 7-10  <3 WBC/hpf   Bacteria, UA FEW (*) RARE  CBC   Collection Time    05/18/12  9:35 AM      Result Value Range   WBC 6.8  4.0 - 10.5 K/uL   RBC 4.27  3.87 - 5.11 MIL/uL   Hemoglobin 11.3 (*) 12.0 - 15.0 g/dL   HCT 16.1 (*) 09.6 - 04.5 %   MCV 78.9  78.0 - 100.0 fL   MCH 26.5  26.0 - 34.0 pg   MCHC 33.5  30.0 - 36.0 g/dL   RDW 40.9  81.1 - 91.4 %   Platelets 222  150 - 400 K/uL  COMPREHENSIVE METABOLIC PANEL   Collection Time    05/18/12  9:35 AM      Result Value Range   Sodium 134 (*) 135 - 145 mEq/L   Potassium 3.4 (*) 3.5 - 5.1 mEq/L   Chloride 99  96 - 112 mEq/L   CO2 23  19 - 32 mEq/L   Glucose, Bld 77  70 - 99 mg/dL   BUN 7  6 - 23  mg/dL   Creatinine, Ser 7.82  0.50 - 1.10 mg/dL   Calcium 8.7  8.4 - 95.6 mg/dL   Total Protein 6.7  6.0 - 8.3 g/dL   Albumin 2.8 (*) 3.5 - 5.2 g/dL   AST 16  0 - 37 U/L   ALT 10  0 - 35 U/L   Alkaline Phosphatase 81  39 - 117 U/L   Total Bilirubin 0.3  0.3 - 1.2 mg/dL   GFR calc non Af Amer >90  >90 mL/min   GFR calc Af Amer >90  >90 mL/min     ASSESSMENT 1. Viral gastroenteritis   2. UTI in pregnancy, antepartum, second trimester     PLAN Discharge home Abx for UTI per Dr Henderson Cloud Increase PO fluids Zofran ODT 4 mg Q 8 hours PRN F/U in office Return to MAU as needed    Medication List    TAKE these medications       amoxicillin 500 MG capsule  Commonly known as:  AMOXIL  Take 1 capsule (500 mg total) by mouth 2 (two) times daily.     ondansetron 4 MG disintegrating tablet  Commonly known as:  ZOFRAN ODT  Take 1 tablet (4 mg total) by mouth every 6 (six) hours as needed for nausea.     prenatal multivitamin Tabs  Take 1 tablet by mouth daily.         Sharen Counter Certified Nurse-Midwife 05/18/2012  9:35 AM

## 2012-05-18 NOTE — MAU Note (Signed)
Attempted saltines crackers and sprite this am, did not stay down.

## 2012-05-18 NOTE — MAU Note (Signed)
Pt states was around group of people this weekend, then everyone came down with sickness at the same time. Since 2200 last pm has had n/v/d, denies sore throat. Afebrile. Denies vag d/c changes or bleeding.

## 2012-05-18 NOTE — MAU Note (Signed)
Spent the weekend with sick family members and is now having the same symptoms since 1300 yesterday;

## 2012-05-19 LAB — URINE CULTURE: Colony Count: 7000

## 2012-08-17 LAB — OB RESULTS CONSOLE GC/CHLAMYDIA: Gonorrhea: NEGATIVE

## 2012-09-12 ENCOUNTER — Encounter: Payer: Self-pay | Admitting: Obstetrics & Gynecology

## 2012-09-12 ENCOUNTER — Ambulatory Visit (INDEPENDENT_AMBULATORY_CARE_PROVIDER_SITE_OTHER): Payer: Medicaid Other | Admitting: Obstetrics & Gynecology

## 2012-09-12 VITALS — BP 109/71 | Temp 97.7°F | Wt 145.0 lb

## 2012-09-12 DIAGNOSIS — Z3493 Encounter for supervision of normal pregnancy, unspecified, third trimester: Secondary | ICD-10-CM | POA: Insufficient documentation

## 2012-09-12 DIAGNOSIS — O99340 Other mental disorders complicating pregnancy, unspecified trimester: Secondary | ICD-10-CM

## 2012-09-12 LAB — OB RESULTS CONSOLE GC/CHLAMYDIA: Chlamydia: NEGATIVE

## 2012-09-12 LAB — POCT URINALYSIS DIP (DEVICE)
Hgb urine dipstick: NEGATIVE
Nitrite: NEGATIVE
Urobilinogen, UA: 2 mg/dL — ABNORMAL HIGH (ref 0.0–1.0)
pH: 6 (ref 5.0–8.0)

## 2012-09-12 NOTE — Progress Notes (Signed)
Pulse- 94 Patient reports lower abdominal/pelvic pressure and increase in vaginal d/c with itching; Patient has not had blood work done since April; [facility] but transferred care because she wants a water birth

## 2012-09-12 NOTE — Progress Notes (Signed)
Transfer from [facility] for water birth, midwife. Spoke to [employer] today as well. Prenatal records reviewed. Patient had declined blood glucose testing as she says she has sx of hypoglycemia

## 2012-09-12 NOTE — Patient Instructions (Addendum)

## 2012-09-13 ENCOUNTER — Encounter: Payer: Self-pay | Admitting: *Deleted

## 2012-09-13 DIAGNOSIS — O99340 Other mental disorders complicating pregnancy, unspecified trimester: Secondary | ICD-10-CM | POA: Insufficient documentation

## 2012-09-13 LAB — WET PREP, GENITAL
Clue Cells Wet Prep HPF POC: NONE SEEN
Trich, Wet Prep: NONE SEEN
Yeast Wet Prep HPF POC: NONE SEEN

## 2012-09-16 ENCOUNTER — Encounter: Payer: Self-pay | Admitting: *Deleted

## 2012-09-19 ENCOUNTER — Ambulatory Visit (INDEPENDENT_AMBULATORY_CARE_PROVIDER_SITE_OTHER): Payer: Medicaid Other | Admitting: Obstetrics & Gynecology

## 2012-09-19 ENCOUNTER — Encounter: Payer: Self-pay | Admitting: Obstetrics & Gynecology

## 2012-09-19 VITALS — BP 113/71 | Wt 144.4 lb

## 2012-09-19 DIAGNOSIS — O99340 Other mental disorders complicating pregnancy, unspecified trimester: Secondary | ICD-10-CM

## 2012-09-19 DIAGNOSIS — Z3493 Encounter for supervision of normal pregnancy, unspecified, third trimester: Secondary | ICD-10-CM

## 2012-09-19 LAB — POCT URINALYSIS DIP (DEVICE)
Bilirubin Urine: NEGATIVE
Glucose, UA: NEGATIVE mg/dL
Nitrite: NEGATIVE
Urobilinogen, UA: 0.2 mg/dL (ref 0.0–1.0)

## 2012-09-19 NOTE — Progress Notes (Signed)
Pulse-  95 

## 2012-09-19 NOTE — Patient Instructions (Signed)

## 2012-09-19 NOTE — Progress Notes (Signed)
Rare BHC, good fetal movement

## 2012-09-25 ENCOUNTER — Encounter (HOSPITAL_COMMUNITY): Payer: Self-pay | Admitting: *Deleted

## 2012-09-25 ENCOUNTER — Inpatient Hospital Stay (HOSPITAL_COMMUNITY)
Admission: AD | Admit: 2012-09-25 | Discharge: 2012-09-27 | DRG: 775 | Disposition: A | Discharge status: Home / Self Care | Payer: Medicaid Other | Source: Ambulatory Visit | Attending: Obstetrics and Gynecology | Admitting: Obstetrics and Gynecology

## 2012-09-25 DIAGNOSIS — Z22330 Carrier of Group B streptococcus: Secondary | ICD-10-CM

## 2012-09-25 DIAGNOSIS — O99892 Other specified diseases and conditions complicating childbirth: Principal | ICD-10-CM | POA: Diagnosis present

## 2012-09-25 DIAGNOSIS — O9989 Other specified diseases and conditions complicating pregnancy, childbirth and the puerperium: Secondary | ICD-10-CM

## 2012-09-25 DIAGNOSIS — O99340 Other mental disorders complicating pregnancy, unspecified trimester: Secondary | ICD-10-CM

## 2012-09-25 LAB — RPR: RPR Ser Ql: NONREACTIVE

## 2012-09-25 LAB — CBC
HCT: 31.1 % — ABNORMAL LOW (ref 36.0–46.0)
Hemoglobin: 10.4 g/dL — ABNORMAL LOW (ref 12.0–15.0)
WBC: 12 10*3/uL — ABNORMAL HIGH (ref 4.0–10.5)

## 2012-09-25 MED ORDER — MEASLES, MUMPS & RUBELLA VAC ~~LOC~~ INJ: 0.5000 mL | INJECTION | Freq: Once | SUBCUTANEOUS | Status: DC

## 2012-09-25 MED ORDER — OXYTOCIN 40 UNITS IN LACTATED RINGERS INFUSION - SIMPLE MED
INTRAVENOUS | Status: AC
  Filled 2012-09-25: qty 1000

## 2012-09-25 MED ORDER — METHYLERGONOVINE MALEATE 0.2 MG/ML IJ SOLN: 0.2000 mg | INTRAMUSCULAR | Status: DC | PRN

## 2012-09-25 MED ORDER — IBUPROFEN 600 MG PO TABS
600.0000 mg | ORAL_TABLET | Freq: Four times a day (QID) | ORAL | Status: DC
  Administered 2012-09-26 – 2012-09-27 (×6): 600 mg via ORAL
  Filled 2012-09-25: qty 1

## 2012-09-25 MED ORDER — OXYTOCIN BOLUS FROM INFUSION: 500.0000 mL | INTRAVENOUS | Status: DC

## 2012-09-25 MED ORDER — SIMETHICONE 80 MG PO CHEW: 80.0000 mg | CHEWABLE_TABLET | ORAL | Status: DC | PRN

## 2012-09-25 MED ORDER — FLEET ENEMA 7-19 GM/118ML RE ENEM: 1.0000 | ENEMA | RECTAL | Status: DC | PRN

## 2012-09-25 MED ORDER — SODIUM CHLORIDE 0.9 % IV SOLN
2.0000 g | Freq: Once | INTRAVENOUS | Status: AC
  Administered 2012-09-25: 2 g via INTRAVENOUS
  Filled 2012-09-25: qty 2000

## 2012-09-25 MED ORDER — DIBUCAINE 1 % RE OINT: 1.0000 "application " | TOPICAL_OINTMENT | RECTAL | Status: DC | PRN

## 2012-09-25 MED ORDER — TETANUS-DIPHTH-ACELL PERTUSSIS 5-2.5-18.5 LF-MCG/0.5 IM SUSP: 0.5000 mL | Freq: Once | INTRAMUSCULAR | Status: DC

## 2012-09-25 MED ORDER — OXYCODONE-ACETAMINOPHEN 5-325 MG PO TABS
1.0000 | ORAL_TABLET | ORAL | Status: DC | PRN
Start: 2012-09-25 — End: 2012-09-25

## 2012-09-25 MED ORDER — BENZOCAINE-MENTHOL 20-0.5 % EX AERO
1.0000 "application " | INHALATION_SPRAY | CUTANEOUS | Status: DC | PRN
  Administered 2012-09-26: 1 via TOPICAL
  Filled 2012-09-25: qty 56

## 2012-09-25 MED ORDER — MAGNESIUM HYDROXIDE 400 MG/5ML PO SUSP: 30.0000 mL | ORAL | Status: DC | PRN

## 2012-09-25 MED ORDER — ONDANSETRON HCL 4 MG/2ML IJ SOLN: 4.0000 mg | Freq: Four times a day (QID) | INTRAMUSCULAR | Status: DC | PRN

## 2012-09-25 MED ORDER — ZOLPIDEM TARTRATE 5 MG PO TABS: 5.0000 mg | ORAL_TABLET | Freq: Every evening | ORAL | Status: DC | PRN

## 2012-09-25 MED ORDER — OXYTOCIN 10 UNIT/ML IJ SOLN
INTRAMUSCULAR | Status: AC
  Administered 2012-09-25: 10 [IU] via INTRAMUSCULAR
  Filled 2012-09-25: qty 1

## 2012-09-25 MED ORDER — OXYTOCIN 40 UNITS IN LACTATED RINGERS INFUSION - SIMPLE MED: 62.5000 mL/h | INTRAVENOUS | Status: DC

## 2012-09-25 MED ORDER — IBUPROFEN 600 MG PO TABS
600.0000 mg | ORAL_TABLET | Freq: Four times a day (QID) | ORAL | Status: DC | PRN
  Administered 2012-09-25: 600 mg via ORAL
  Filled 2012-09-25 (×6): qty 1

## 2012-09-25 MED ORDER — LACTATED RINGERS IV SOLN: 500.0000 mL | INTRAVENOUS | Status: DC | PRN

## 2012-09-25 MED ORDER — DIPHENHYDRAMINE HCL 25 MG PO CAPS: 25.0000 mg | ORAL_CAPSULE | Freq: Four times a day (QID) | ORAL | Status: DC | PRN

## 2012-09-25 MED ORDER — ONDANSETRON HCL 4 MG PO TABS: 4.0000 mg | ORAL_TABLET | ORAL | Status: DC | PRN

## 2012-09-25 MED ORDER — SENNOSIDES-DOCUSATE SODIUM 8.6-50 MG PO TABS
2.0000 | ORAL_TABLET | Freq: Every day | ORAL | Status: DC
  Administered 2012-09-26: 2 via ORAL

## 2012-09-25 MED ORDER — LACTATED RINGERS IV SOLN
INTRAVENOUS | Status: DC
  Administered 2012-09-25: 14:00:00 via INTRAVENOUS

## 2012-09-25 MED ORDER — CITRIC ACID-SODIUM CITRATE 334-500 MG/5ML PO SOLN: 30.0000 mL | ORAL | Status: DC | PRN

## 2012-09-25 MED ORDER — LANOLIN HYDROUS EX OINT: 1.0000 "application " | TOPICAL_OINTMENT | CUTANEOUS | Status: DC | PRN

## 2012-09-25 MED ORDER — LIDOCAINE HCL (PF) 1 % IJ SOLN
30.0000 mL | INTRAMUSCULAR | Status: DC | PRN
  Filled 2012-09-25: qty 30

## 2012-09-25 MED ORDER — FERROUS SULFATE 325 (65 FE) MG PO TABS
325.0000 mg | ORAL_TABLET | Freq: Two times a day (BID) | ORAL | Status: DC
  Administered 2012-09-26 (×2): 325 mg via ORAL
  Filled 2012-09-25 (×3): qty 1

## 2012-09-25 MED ORDER — WITCH HAZEL-GLYCERIN EX PADS: 1.0000 "application " | MEDICATED_PAD | CUTANEOUS | Status: DC | PRN

## 2012-09-25 MED ORDER — OXYCODONE-ACETAMINOPHEN 5-325 MG PO TABS
1.0000 | ORAL_TABLET | ORAL | Status: DC | PRN
  Administered 2012-09-25 – 2012-09-26 (×3): 1 via ORAL
  Filled 2012-09-25 (×3): qty 1

## 2012-09-25 MED ORDER — ONDANSETRON HCL 4 MG/2ML IJ SOLN: 4.0000 mg | INTRAMUSCULAR | Status: DC | PRN

## 2012-09-25 MED ORDER — ACETAMINOPHEN 325 MG PO TABS: 650.0000 mg | ORAL_TABLET | ORAL | Status: DC | PRN

## 2012-09-25 MED ORDER — PRENATAL MULTIVITAMIN CH
1.0000 | ORAL_TABLET | Freq: Every day | ORAL | Status: DC
  Administered 2012-09-26: 1 via ORAL
  Filled 2012-09-25: qty 1

## 2012-09-25 MED ORDER — METHYLERGONOVINE MALEATE 0.2 MG PO TABS
0.2000 mg | ORAL_TABLET | ORAL | Status: DC | PRN
  Administered 2012-09-25 – 2012-09-26 (×5): 0.2 mg via ORAL
  Filled 2012-09-25 (×6): qty 1

## 2012-09-25 NOTE — ED Notes (Signed)
Pt desires a water birth. Midwife not on schedule. Notifed  DR> Beck and Dr. Emelda Fear. Trying to contact a midwife to accommodate delivery.

## 2012-09-25 NOTE — H&P (Signed)
Breanna [name] is a 27 y.o. female G25P2002 with IUP at [redacted]w[redacted]d presenting for SROM and active labor.  Pt states she has been having regular, every 2-3 minutes contractions, associated with no vaginal bleeding.  Membranes are ruptured, clear fluid, with active fetal movement.     [facility] and then [facility]. She is GBS +. No other complications of pregnancy. 1 hour glucola not done per pt request.   Prenatal History/Complications:  Past Medical History: Past Medical History  Diagnosis Date  . UTI (lower urinary tract infection)   . BV (bacterial vaginosis)   . History of postpartum depression, currently pregnant   . Anxiety   . Depression     Past Surgical History: Past Surgical History  Procedure Laterality Date  . Rhinoplasty    . Mouth surgery      Obstetrical History: OB History   Grav Para Term Preterm Abortions TAB SAB Ect Mult Living   3 2 2  0 0 0 0 0 0 2      Social History: History   Social History  . Marital Status: Single    Spouse Name: N/A    Number of Children: N/A  . Years of Education: N/A   Social History Main Topics  . Smoking status: Former Smoker    Types: Cigarettes  . Smokeless tobacco: Never Used  . Alcohol Use: No  . Drug Use: No  . Sexually Active: Not Currently    Birth Control/ Protection: None   Other Topics Concern  . None   Social History Narrative  . None    Family History: Family History  Problem Relation Age of Onset  . Alcohol abuse Neg Hx   . Arthritis Neg Hx   . Asthma Neg Hx   . Birth defects Neg Hx   . Cancer Neg Hx   . COPD Neg Hx   . Depression Neg Hx   . Drug abuse Neg Hx   . Early death Neg Hx   . Hearing loss Neg Hx   . Hyperlipidemia Neg Hx   . Learning disabilities Neg Hx   . Kidney disease Neg Hx   . Mental illness Neg Hx   . Mental retardation Neg Hx   . Miscarriages / Stillbirths Neg Hx   . Stroke Neg Hx   . Vision loss Neg Hx   . Thyroid disease  Maternal Grandmother   . Bipolar disorder Maternal Grandmother   . Diabetes Paternal Grandmother   . Heart disease Paternal Grandmother   . Hypertension Paternal Grandmother     Allergies: Allergies  Allergen Reactions  . Latex Itching    Vaginal irritation    Prescriptions prior to admission  Medication Sig Dispense Refill  . Prenatal Vit-Fe Fumarate-FA (PRENATAL MULTIVITAMIN) TABS Take 1 tablet by mouth daily.         Review of Systems   Constitutional: Negative for fever, chills, weight loss, malaise/fatigue and diaphoresis.  HENT: Negative for hearing loss, ear pain, nosebleeds, congestion, sore throat, neck pain, tinnitus and ear discharge.   Eyes: Negative for blurred vision, double vision, photophobia, pain, discharge and redness.  Respiratory: Negative for cough, hemoptysis, sputum production, shortness of breath, wheezing and stridor.   Cardiovascular: Negative for chest pain, palpitations, orthopnea,  leg swelling  Gastrointestinal: Positive for abdominal pain. Negative for heartburn, nausea, vomiting, diarrhea, constipation, blood in stool Genitourinary: Negative for dysuria, urgency, frequency, hematuria and flank pain.  Musculoskeletal: Negative for myalgias,  back pain, joint pain and falls.  Skin: Negative for itching and rash.  Neurological: Negative for dizziness, tingling, tremors, sensory change, speech change, focal weakness, seizures, loss of consciousness, weakness and headaches.  Endo/Heme/Allergies: Negative for environmental allergies and polydipsia. Does not bruise/bleed easily.  Psychiatric/Behavioral: Negative for depression, suicidal ideas, hallucinations, memory loss and substance abuse. The patient is not nervous/anxious and does not have insomnia.       Blood pressure 114/62, pulse 81, temperature 98.6 F (37 C), resp. rate 14, height 5' 4.5" (1.638 m), weight 67.132 kg (148 lb), last menstrual period 01/01/2012. General appearance: alert,  cooperative and moderate distress- appears uncomfortable Lungs: clear to auscultation bilaterally Heart: regular rate and rhythm Abdomen: soft, non-tender; bowel sounds normal Pelvic: deferred Extremities: Homans sign is negative, no sign of DVT Presentation: cephalic and per nursing check Fetal monitoringBaseline: 140 bpm, Variability: Good {> 6 bpm), Accelerations: Reactive and Decelerations: occasional earlies Uterine activity  Regular contractions  Dilation: 5 Effacement (%): 100 Station: 0 Exam by:: Marijean Heath, RN   Prenatal labs: ABO, Rh: B/Positive/-- (01/29 0000) Antibody: Negative (01/29 0000) Rubella:   RPR: Nonreactive (01/29 0000)  HBsAg: Negative (01/29 0000)  HIV: Non-reactive (01/29 0000)  GBS: Positive (01/29 0000)  1 hr Glucola declined Genetic screening  declined Anatomy US normal  .resultrcnt[24h  Assessment: Breanna [name] is a 27 y.o. [id number] with an IUP at [redacted]w[redacted]d presenting for SROM and active labor  Plan: - admit to L&D - routine orders - consent for water birth signed - [place], CNM with arrive to help with delivery  2) FWB - cat I tracing - GBS +, ampicillin due to active labor  3) anticipate SVD   [place]ansfield Dann L, MD 09/25/2012, 2:52 PM

## 2012-09-25 NOTE — Progress Notes (Signed)
Breanna [name] is a 27 y.o. G3P2002 at [redacted]w[redacted]d.   Subjective: Increased intensity of UC's. Coping will w/ hydrotherapy.  Objective: BP 114/62  Pulse 81  Temp(Src) 98.6 F (37 C)  Resp 14  Ht 5' 4.5" (1.638 m)  Wt 67.132 kg (148 lb)  BMI 25.02 kg/m2  LMP 01/01/2012      FHT:  FHR: 130 bpm, no decelerations UC:   regular, every 3 minutes, strong SVE:   Deferred  Labs: Lab Results  Component Value Date   WBC 12.0* 09/25/2012   HGB 10.4* 09/25/2012   HCT 31.1* 09/25/2012   MCV 74.9* 09/25/2012   PLT 230 09/25/2012    Assessment / Plan: Spontaneous labor, progressing normally  Labor: Progressing normally Preeclampsia:  NA Fetal Wellbeing:  Reassuring Pain Control:  Hydrotherapy. I/D:  n/a Anticipated MOD:  NSVD Pt up to bathroom. Changing positions frequently in tub.  Waterbirth consent on chart.   Dorathy Kinsman 09/25/2012, 4:17 PM

## 2012-09-25 NOTE — Progress Notes (Signed)
Paitent's overall EBL 700. Dorathy Kinsman, CNM aware of bleeding (at bedside). And ordered that the methergen protocol be initiated.

## 2012-09-25 NOTE — MAU Note (Signed)
Pt reports her water broke at 1110. Reports contractions q4-68min fetal movement reported.

## 2012-09-26 ENCOUNTER — Encounter: Payer: Medicaid Other | Admitting: Obstetrics and Gynecology

## 2012-09-26 LAB — CBC
Hemoglobin: 9.1 g/dL — ABNORMAL LOW (ref 12.0–15.0)
MCH: 25.7 pg — ABNORMAL LOW (ref 26.0–34.0)
Platelets: 204 10*3/uL (ref 150–400)
RBC: 3.54 MIL/uL — ABNORMAL LOW (ref 3.87–5.11)
WBC: 11.5 10*3/uL — ABNORMAL HIGH (ref 4.0–10.5)

## 2012-09-26 NOTE — Progress Notes (Signed)
UR chart review completed.  

## 2012-09-26 NOTE — Progress Notes (Signed)
Post Partum Day 1 Subjective: no complaints, up ad lib and voiding  Objective: Blood pressure 90/58, pulse 62, temperature 98.2 F (36.8 C), temperature source Oral, resp. rate 16, height 5' 4.5" (1.638 m), weight 67.132 kg (148 lb), last menstrual period 01/01/2012, unknown if currently breastfeeding.  Physical Exam:  General: alert, cooperative and appears stated age Lochia: appropriate Uterine Fundus: firm DVT Evaluation: No evidence of DVT seen on physical exam. Negative Homan's sign. No cords or calf tenderness.   Recent Labs  09/25/12 1419 09/26/12 0540  HGB 10.4* 9.1*  HCT 31.1* 26.6*    Assessment/Plan: Plan for discharge tomorrow, Breastfeeding and Contraception : Nuvaring   LOS: 1 day   CLARK, MICHAEL L 09/26/2012, 7:57 AM   I have seen and examined this patient and agree with above documentation in the PA student's note. She is doing well and will d/c tomorrow.   Rulon Abide, M.D. [facility] Fellow 09/26/2012 8:00 AM

## 2012-09-27 ENCOUNTER — Encounter: Payer: Self-pay | Admitting: Obstetrics & Gynecology

## 2012-09-27 MED ORDER — IBUPROFEN 600 MG PO TABS: 600.0000 mg | ORAL_TABLET | Freq: Four times a day (QID) | ORAL | Status: DC

## 2012-09-27 NOTE — Discharge Summary (Signed)
Obstetric Discharge Summary Reason for Admission: onset of labor and rupture of membranes Prenatal Procedures: none Intrapartum Procedures: spontaneous vaginal delivery and GBS prophylaxis (<4hrs of treatment) Postpartum Procedures: none Complications-Operative and Postpartum: none  Breast feeding. Nuvaring for contraception.  Hemoglobin  Date Value Range Status  09/26/2012 9.1* 12.0 - 15.0 g/dL Final  0/98/1191 47.8   Final     HCT  Date Value Range Status  09/26/2012 26.6* 36.0 - 46.0 % Final  03/16/2012 35   Final    Physical Exam:  General: alert, cooperative and no distress Lochia: appropriate Uterine Fundus: firm Incision: healing well, no significant drainage, no dehiscence, no significant erythema DVT Evaluation: No evidence of DVT seen on physical exam. No cords or calf tenderness. No significant calf/ankle edema.  Discharge Diagnoses: Term Pregnancy-delivered  Discharge Information: Date: 09/27/2012 Activity: pelvic rest Diet: routine Medications: PNV and Ibuprofen Condition: stable Instructions: refer to practice specific booklet Discharge to: home Follow-up Information   Follow up with [facility]. Schedule an appointment as soon as possible for a visit in 4 weeks. (Call and schedule an appointment for 4 weeks from now)    Contact information:   [address] [place] [address] [phone]      Newborn Data: Live born female  Birth Weight: 7 lb 10.8 oz (3481 g) APGAR: 8, 9  Home with mother.  Tawni Carnes 09/27/2012, 6:42 AM  I have seen and examined this patient and agree with above documentation in the resident's note. Pt and mother doing well. D/c today. Will need nuvaring at postpartum visit.   Rulon Abide, M.D. [facility] Fellow 09/27/2012 9:13 AM

## 2012-09-28 NOTE — Discharge Summary (Signed)
Attestation of Attending Supervision of Obstetric Fellow: Evaluation and management procedures were performed by the Obstetric Fellow under my supervision and collaboration.  I have reviewed the Obstetric Fellow's note and chart, and I agree with the management and plan.  Edder Bellanca, MD, FACOG Attending Obstetrician & Gynecologist Faculty Practice, [facility]   

## 2012-09-29 ENCOUNTER — Telehealth: Payer: Self-pay | Admitting: *Deleted

## 2012-09-29 NOTE — Telephone Encounter (Signed)
Patient left message stating that she delivered on 09/25/12. She has been doing well however today she has had some increased cramping and passed one large clot. Other than the one clot she is bleeding only very lightly. She wanted to know if this is something that she should be concerned with or is it ok?

## 2012-09-30 NOTE — H&P (Signed)
Attestation of Attending Supervision of Advanced Practitioner: Evaluation and management procedures were performed by the PA/NP/CNM/OB Fellow under my supervision/collaboration. Chart reviewed and agree with management and plan.  Christl Fessenden V 09/30/2012 5:52 AM

## 2012-10-04 NOTE — Telephone Encounter (Signed)
Called patient and stated I was returning her phone call from the other day. Patient stated don't worry about it, I called you like a week ago and that was an emergency situation and someone is just now calling me back. I'm fine now, its already been handled, just forget about it. Patient hung up the phone before anything could be said.

## 2012-10-10 ENCOUNTER — Telehealth: Payer: Self-pay | Admitting: Obstetrics and Gynecology

## 2012-10-10 NOTE — Telephone Encounter (Signed)
Patient called with c/o boils on her neck and requesting antibiotic for them. Advised patient that since she's already delivered her baby, she would need to go to her family doctor for this. Patient agrees and satisfied.

## 2012-10-19 ENCOUNTER — Telehealth: Payer: Self-pay | Admitting: *Deleted

## 2012-10-19 NOTE — Telephone Encounter (Signed)
Pt left message stating that she would like appt this week. She has questions about her bleeding and does not want to wait until appt on 9/15 to talk to the doctor about it.

## 2012-10-20 NOTE — Telephone Encounter (Signed)
Patient wanted to get an earlier appt than 10/31/12. Patient states that she has boils and that her baby's pediatrician states she would need to get antibiotic for it. Advised patient that we do not have any earlier appt and that if her symptoms worsens, that she may go to MAU. Patient states understanding and satisfied.

## 2012-10-31 ENCOUNTER — Ambulatory Visit: Payer: Medicaid Other | Admitting: Obstetrics & Gynecology

## 2012-10-31 ENCOUNTER — Encounter: Payer: Self-pay | Admitting: Obstetrics & Gynecology

## 2012-10-31 MED ORDER — PRENATAL MULTIVITAMIN CH: 1.0000 | ORAL_TABLET | Freq: Every day | ORAL | Status: DC

## 2012-10-31 NOTE — Patient Instructions (Signed)

## 2012-10-31 NOTE — Progress Notes (Unsigned)
Patient ID: Breanna [name], female   DOB: 25-Jan-1986, 27 y.o.   MRN: [id number] Subjective:     Breanna [name] is a 27 y.o. female who presents for a postpartum visit. She is 6 weeks postpartum following a spontaneous vaginal delivery. I have fully reviewed the prenatal and intrapartum course. The delivery was at 38.2 gestational weeks. Outcome: spontaneous vaginal delivery. Anesthesia: none. Postpartum course has been complicated by bleeding ans cramps, postpartum blues. Baby's course has been normal. Baby is feeding by breast. Bleeding moderate lochia. Bowel function is normal. Bladder function is normal. Patient is not sexually active. Contraception method is abstinence. Postpartum depression screening: 7.  The following portions of the patient's history were reviewed and updated as appropriate: allergies, current medications, past family history, past medical history, past social history, past surgical history and problem list.  Review of Systems Behavioral/Psych: positive for anxiety and bad mood   Objective:    BP 100/67  Pulse 66  Temp(Src) 98.4 F (36.9 C) (Oral)  Ht 5\' 4"  (1.626 m)  Wt 130 lb (58.968 kg)  BMI 22.3 kg/m2  Breastfeeding? Yes  General:  alert and cooperative   Breasts:    Lungs:    Heart:     Abdomen: soft, non-tender; bowel sounds normal; no masses,  no organomegaly   Vulva:  normal  Vagina: vagina positive for blood  Cervix:  no lesions  Corpus: uterus absent  Adnexa:  normal adnexa  Rectal Exam: Not performed.        Assessment:     6 week postpartum exam. Pap smear not done at today's visit.   Plan:    1. Contraception: abstinence 2. F/u US for DUB, r/o retained 3. Follow up in: 2 weeks or as needed.   Adam Phenix, MD 10/31/2012

## 2012-11-01 ENCOUNTER — Telehealth: Payer: Self-pay | Admitting: *Deleted

## 2012-11-01 LAB — CBC
HCT: 33.4 % — ABNORMAL LOW (ref 36.0–46.0)
RBC: 4.57 MIL/uL (ref 3.87–5.11)
RDW: 15.5 % (ref 11.5–15.5)
WBC: 5.1 10*3/uL (ref 4.0–10.5)

## 2012-11-01 MED ORDER — CITRANATAL HARMONY 27-1-250 MG PO CAPS: 1.0000 | ORAL_CAPSULE | Freq: Every day | ORAL | Status: DC

## 2012-11-01 NOTE — Telephone Encounter (Signed)
Pt left message stating that the wrong prenatal vitamins were ordered. She cannot take tablets and needs a specific vitamin- Citranatal [place]. She has checked with her pharmacy and confirmed that they have this in stock. I e-prescribed the prenatal vitamin as requested and notified pt. She voiced understanding.

## 2012-11-07 ENCOUNTER — Ambulatory Visit (HOSPITAL_COMMUNITY)
Admission: RE | Admit: 2012-11-07 | Discharge: 2012-11-07 | Disposition: A | Discharge status: Home / Self Care | Payer: Medicaid Other | Source: Ambulatory Visit | Attending: Obstetrics & Gynecology | Admitting: Obstetrics & Gynecology

## 2012-11-14 ENCOUNTER — Ambulatory Visit (INDEPENDENT_AMBULATORY_CARE_PROVIDER_SITE_OTHER): Payer: Medicaid Other | Admitting: Obstetrics & Gynecology

## 2012-11-14 ENCOUNTER — Encounter: Payer: Self-pay | Admitting: Obstetrics & Gynecology

## 2012-11-14 VITALS — BP 138/75 | HR 61 | Ht 64.0 in | Wt 131.5 lb

## 2012-11-14 DIAGNOSIS — N949 Unspecified condition associated with female genital organs and menstrual cycle: Secondary | ICD-10-CM

## 2012-11-14 DIAGNOSIS — N938 Other specified abnormal uterine and vaginal bleeding: Secondary | ICD-10-CM

## 2012-11-14 MED ORDER — ETONOGESTREL-ETHINYL ESTRADIOL 0.12-0.015 MG/24HR VA RING: VAGINAL_RING | VAGINAL | Status: DC

## 2012-11-14 MED ORDER — FERROUS SULFATE 325 (65 FE) MG PO TABS: 325.0000 mg | ORAL_TABLET | Freq: Every day | ORAL | Status: DC

## 2012-11-14 NOTE — Progress Notes (Signed)
Patient ID: Breanna [name], female   DOB: 1985-03-31, 27 y.o.   MRN: [id number] Still with DUB. [place] was done here for f/u  CLINICAL DATA: Postpartum vaginal bleeding.  EXAM:  TRANSABDOMINAL AND TRANSVAGINAL ULTRASOUND OF PELVIS  TECHNIQUE:  Both transabdominal and transvaginal ultrasound examinations of the  pelvis were performed. Transabdominal technique was performed for  global imaging of the pelvis including uterus, ovaries, adnexal  regions, and pelvic cul-de-sac. It was necessary to proceed with  endovaginal exam following the transabdominal exam to visualize the  endometrium and adnexae.  COMPARISON: None  FINDINGS:  Uterus  Measurements: 6.3 x 3.6 x 4.9 cm. No fibroids or other mass  visualized.  Endometrium  Thickness: 6 mm. No focal abnormality visualized.  Right ovary  Measurements: 2.8 x 1.8 by 2.0 cm. Normal appearance/no adnexal  mass.  Left ovary  Measurements: 2.1 x 1.8 x 1.8 cm. Normal appearance/no adnexal mass.  Other findings  No free fluid.  IMPRESSION:  Negative. No evidence of retained products of conception or other  significant abnormality.  Electronically Signed  By: Myles Rosenthal  On: 11/07/2012 11:13  Normal [place], DUB PP  Requests Nuvaring. Will begin and report if no improvement.  Adam Phenix, MD 11/14/2012

## 2012-11-14 NOTE — Patient Instructions (Signed)

## 2012-12-22 ENCOUNTER — Other Ambulatory Visit: Payer: Self-pay

## 2013-01-16 ENCOUNTER — Telehealth: Payer: Self-pay | Admitting: General Practice

## 2013-01-16 MED ORDER — CITRANATAL HARMONY 27-1-250 MG PO CAPS: 1.0000 | ORAL_CAPSULE | Freq: Every day | ORAL | Status: DC

## 2013-01-16 MED ORDER — METRONIDAZOLE 500 MG PO TABS: 500.0000 mg | ORAL_TABLET | Freq: Two times a day (BID) | ORAL | Status: DC

## 2013-01-16 NOTE — Telephone Encounter (Signed)
Called patient, no answer- left message that we are returning your phone call, please call us back at the clinics. Per chart review, patient has 12 refills on PNV

## 2013-01-16 NOTE — Telephone Encounter (Addendum)
Pt called and stated that she gets BV usually after putting in her Nuva Ring.  She also stated that she is having white, vaginal discharge with fishy odor.  I informed pt that I would send am Rx for flagyl 500 mg po bid for 7 days.  Pt stated that because she did not go pick up the PNV her pharmacy put them back so that is the reason why she needs another refill. I advised pt that I would reorder her prenatal vitamins.  Pt stated understanding with no further questions.

## 2013-01-16 NOTE — Telephone Encounter (Signed)
Patient called and left message stating she would like a refill on her PNV and she thinks she has a bacterial infection and would like metronidazole called in.

## 2013-07-19 ENCOUNTER — Telehealth: Payer: Self-pay | Admitting: *Deleted

## 2013-07-19 NOTE — Telephone Encounter (Signed)
Bonita Quin with [facility] office called to request NPI number for patient.  Pt has appt for GYN consult.  NPI given, pt needs to contact medicaid office to change provider information on card. Clovis Pu, RN

## 2013-12-18 ENCOUNTER — Encounter: Payer: Self-pay | Admitting: Obstetrics & Gynecology

## 2014-03-06 ENCOUNTER — Telehealth: Payer: Self-pay | Admitting: *Deleted

## 2014-03-06 NOTE — Telephone Encounter (Signed)
[facility] called to request NPI number for patient.  Pt has appt for today.  NPI given x 1, pt needs to contact medicaid office to change provider information on card. Clovis PuMartin, Tamika L, RN

## 2015-06-27 ENCOUNTER — Ambulatory Visit (INDEPENDENT_AMBULATORY_CARE_PROVIDER_SITE_OTHER): Payer: Medicaid Other

## 2015-06-27 ENCOUNTER — Ambulatory Visit (HOSPITAL_COMMUNITY)
Admission: EM | Admit: 2015-06-27 | Discharge: 2015-06-27 | Disposition: A | Discharge status: Home / Self Care | Payer: Medicaid Other | Attending: Family Medicine | Admitting: Family Medicine

## 2015-06-27 ENCOUNTER — Encounter (HOSPITAL_COMMUNITY): Payer: Self-pay | Admitting: Emergency Medicine

## 2015-06-27 DIAGNOSIS — R630 Anorexia: Secondary | ICD-10-CM | POA: Diagnosis not present

## 2015-06-27 DIAGNOSIS — R1013 Epigastric pain: Secondary | ICD-10-CM | POA: Diagnosis not present

## 2015-06-27 DIAGNOSIS — K5901 Slow transit constipation: Secondary | ICD-10-CM | POA: Diagnosis not present

## 2015-06-27 DIAGNOSIS — R110 Nausea: Secondary | ICD-10-CM

## 2015-06-27 LAB — POCT URINALYSIS DIP (DEVICE)
Bilirubin Urine: NEGATIVE
GLUCOSE, UA: NEGATIVE mg/dL
HGB URINE DIPSTICK: NEGATIVE
Ketones, ur: NEGATIVE mg/dL
LEUKOCYTES UA: NEGATIVE
NITRITE: NEGATIVE
Protein, ur: NEGATIVE mg/dL
Specific Gravity, Urine: 1.02 (ref 1.005–1.030)
UROBILINOGEN UA: 0.2 mg/dL (ref 0.0–1.0)
pH: 6 (ref 5.0–8.0)

## 2015-06-27 LAB — POCT I-STAT, CHEM 8
BUN: 4 mg/dL — ABNORMAL LOW (ref 6–20)
Calcium, Ion: 1.21 mmol/L (ref 1.12–1.23)
Chloride: 103 mmol/L (ref 101–111)
Creatinine, Ser: 0.7 mg/dL (ref 0.44–1.00)
GLUCOSE: 79 mg/dL (ref 65–99)
HCT: 38 % (ref 36.0–46.0)
HEMOGLOBIN: 12.9 g/dL (ref 12.0–15.0)
POTASSIUM: 3.4 mmol/L — AB (ref 3.5–5.1)
Sodium: 142 mmol/L (ref 135–145)
TCO2: 24 mmol/L (ref 0–100)

## 2015-06-27 LAB — POCT H PYLORI SCREEN: H. PYLORI SCREEN, POC: NEGATIVE

## 2015-06-27 LAB — POCT PREGNANCY, URINE: PREG TEST UR: NEGATIVE

## 2015-06-27 MED ORDER — ONDANSETRON HCL 4 MG PO TABS: 4.0000 mg | ORAL_TABLET | Freq: Three times a day (TID) | ORAL | Status: DC | PRN

## 2015-06-27 MED ORDER — ONDANSETRON 4 MG PO TBDP
4.0000 mg | ORAL_TABLET | Freq: Once | ORAL | Status: AC
  Administered 2015-06-27: 4 mg via ORAL

## 2015-06-27 MED ORDER — ONDANSETRON 4 MG PO TBDP
ORAL_TABLET | ORAL | Status: AC
  Filled 2015-06-27: qty 1

## 2015-06-27 NOTE — Discharge Instructions (Signed)
Constipation, Adult Miralax as directed. Start with 2 glasses (doses) today. Repeat tomorrow as needed. Constipation is when a person has fewer than three bowel movements a week, has difficulty having a bowel movement, or has stools that are dry, hard, or larger than normal. As people grow older, constipation is more common. A low-fiber diet, not taking in enough fluids, and taking certain medicines may make constipation worse.  CAUSES   Certain medicines, such as antidepressants, pain medicine, iron supplements, antacids, and water pills.   Certain diseases, such as diabetes, irritable bowel syndrome (IBS), thyroid disease, or depression.   Not drinking enough water.   Not eating enough fiber-rich foods.   Stress or travel.   Lack of physical activity or exercise.   Ignoring the urge to have a bowel movement.   Using laxatives too much.  SIGNS AND SYMPTOMS   Having fewer than three bowel movements a week.   Straining to have a bowel movement.   Having stools that are hard, dry, or larger than normal.   Feeling full or bloated.   Pain in the lower abdomen.   Not feeling relief after having a bowel movement.  DIAGNOSIS  Your health care provider will take a medical history and perform a physical exam. Further testing may be done for severe constipation. Some tests may include:  A barium enema X-ray to examine your rectum, colon, and, sometimes, your small intestine.   A sigmoidoscopy to examine your lower colon.   A colonoscopy to examine your entire colon. TREATMENT  Treatment will depend on the severity of your constipation and what is causing it. Some dietary treatments include drinking more fluids and eating more fiber-rich foods. Lifestyle treatments may include regular exercise. If these diet and lifestyle recommendations do not help, your health care provider may recommend taking over-the-counter laxative medicines to help you have bowel movements.  Prescription medicines may be prescribed if over-the-counter medicines do not work.  HOME CARE INSTRUCTIONS   Eat foods that have a lot of fiber, such as fruits, vegetables, whole grains, and beans.  Limit foods high in fat and processed sugars, such as french fries, hamburgers, cookies, candies, and soda.   A fiber supplement may be added to your diet if you cannot get enough fiber from foods.   Drink enough fluids to keep your urine clear or pale yellow.   Exercise regularly or as directed by your health care provider.   Go to the restroom when you have the urge to go. Do not hold it.   Only take over-the-counter or prescription medicines as directed by your health care provider. Do not take other medicines for constipation without talking to your health care provider first.  SEEK IMMEDIATE MEDICAL CARE IF:   You have bright red blood in your stool.   Your constipation lasts for more than 4 days or gets worse.   You have abdominal or rectal pain.   You have thin, pencil-like stools.   You have unexplained weight loss. MAKE SURE YOU:   Understand these instructions.  Will watch your condition.  Will get help right away if you are not doing well or get worse.   This information is not intended to replace advice given to you by your health care provider. Make sure you discuss any questions you have with your health care provider.   Document Released: 11/01/2003 Document Revised: 02/23/2014 Document Reviewed: 11/14/2012 Elsevier Interactive Patient Education 2016 [employer] Inc[employer]  High-Fiber Diet Fiber, also called dietary fiber,  is a type of carbohydrate found in fruits, vegetables, whole grains, and beans. A high-fiber diet can have many health benefits. Your health care provider may recommend a high-fiber diet to help:  Prevent constipation. Fiber can make your bowel movements more regular.  Lower your cholesterol.  Relieve hemorrhoids, uncomplicated  diverticulosis, or irritable bowel syndrome.  Prevent overeating as part of a weight-loss plan.  Prevent heart disease, type 2 diabetes, and certain cancers. WHAT IS MY PLAN? The recommended daily intake of fiber includes:  38 grams for men under age 76.  30 grams for men over age 3.  25 grams for women under age 29.  21 grams for women over age 17. You can get the recommended daily intake of dietary fiber by eating a variety of fruits, vegetables, grains, and beans. Your health care provider may also recommend a fiber supplement if it is not possible to get enough fiber through your diet. WHAT DO I NEED TO KNOW ABOUT A HIGH-FIBER DIET?  Fiber supplements have not been widely studied for their effectiveness, so it is better to get fiber through food sources.  Always check the fiber content on thenutrition facts label of any prepackaged food. Look for foods that contain at least 5 grams of fiber per serving.  Ask your dietitian if you have questions about specific foods that are related to your condition, especially if those foods are not listed in the following section.  Increase your daily fiber consumption gradually. Increasing your intake of dietary fiber too quickly may cause bloating, cramping, or gas.  Drink plenty of water. Water helps you to digest fiber. WHAT FOODS CAN I EAT? Grains Whole-grain breads. Multigrain cereal. Oats and oatmeal. Brown rice. Barley. Bulgur wheat. Millet. Bran muffins. Popcorn. Rye wafer crackers. Vegetables Sweet potatoes. Spinach. Kale. Artichokes. Cabbage. Broccoli. Green peas. Carrots. Squash. Fruits Berries. Pears. Apples. Oranges. Avocados. Prunes and raisins. Dried figs. Meats and Other Protein Sources Navy, kidney, pinto, and soy beans. Split peas. Lentils. Nuts and seeds. Dairy Fiber-fortified yogurt. Beverages Fiber-fortified soy milk. Fiber-fortified orange juice. Other Fiber bars. The items listed above may not be a complete  list of recommended foods or beverages. Contact your dietitian for more options. WHAT FOODS ARE NOT RECOMMENDED? Grains White bread. Pasta made with refined flour. White rice. Vegetables Fried potatoes. Canned vegetables. Well-cooked vegetables.  Fruits Fruit juice. Cooked, strained fruit. Meats and Other Protein Sources Fatty cuts of meat. Fried Environmental education officer or fried fish. Dairy Milk. Yogurt. Cream cheese. Sour cream. Beverages Soft drinks. Other Cakes and pastries. Butter and oils. The items listed above may not be a complete list of foods and beverages to avoid. Contact your dietitian for more information. WHAT ARE SOME TIPS FOR INCLUDING HIGH-FIBER FOODS IN MY DIET?  Eat a wide variety of high-fiber foods.  Make sure that half of all grains consumed each day are whole grains.  Replace breads and cereals made from refined flour or white flour with whole-grain breads and cereals.  Replace white rice with brown rice, bulgur wheat, or millet.  Start the day with a breakfast that is high in fiber, such as a cereal that contains at least 5 grams of fiber per serving.  Use beans in place of meat in soups, salads, or pasta.  Eat high-fiber snacks, such as berries, raw vegetables, nuts, or popcorn.   This information is not intended to replace advice given to you by your health care provider. Make sure you discuss any questions you have with your  health care provider.   Document Released: 02/02/2005 Document Revised: 02/23/2014 Document Reviewed: 07/18/2013 Elsevier Interactive Patient Education 2016 Elsevier Inc.  Nausea, Adult Nausea is the feeling that you have an upset stomach or have to vomit. Nausea by itself is not likely a serious concern, but it may be an early sign of more serious medical problems. As nausea gets worse, it can lead to vomiting. If vomiting develops, there is the risk of dehydration.  CAUSES   Viral infections.  Food  poisoning.  Medicines.  Pregnancy.  Motion sickness.  Migraine headaches.  Emotional distress.  Severe pain from any source.  Alcohol intoxication. HOME CARE INSTRUCTIONS  Get plenty of rest.  Ask your caregiver about specific rehydration instructions.  Eat small amounts of food and sip liquids more often.  Take all medicines as told by your caregiver. SEEK MEDICAL CARE IF:  You have not improved after 2 days, or you get worse.  You have a headache. SEEK IMMEDIATE MEDICAL CARE IF:   You have a fever.  You faint.  You keep vomiting or have blood in your vomit.  You are extremely weak or dehydrated.  You have dark or bloody stools.  You have severe chest or abdominal pain. MAKE SURE YOU:  Understand these instructions.  Will watch your condition.  Will get help right away if you are not doing well or get worse.   This information is not intended to replace advice given to you by your health care provider. Make sure you discuss any questions you have with your health care provider.   Document Released: 03/12/2004 Document Revised: 02/23/2014 Document Reviewed: 10/15/2010 Elsevier Interactive Patient Education [employer]

## 2015-06-27 NOTE — ED Notes (Signed)
Patient reports epigastric pain intermittently for 2 weeks.  For the last 2-3 days, pain has become "unbearable" per patient.  Pain is a burning sensation.  Unable to relate to anything making it better or worse.  Initially, tums made pain better.  Now has a poor appetite.  Patient reports nausea and dizziness.

## 2015-06-27 NOTE — ED Provider Notes (Signed)
CSN: [id number]     Arrival date & time 06/27/15  1317 History   First MD Initiated Contact with Patient 06/27/15 1413     Chief Complaint  Patient presents with  . Abdominal Pain   (Consider location/radiation/quality/duration/timing/severity/associated sxs/prior Treatment) HPI Comments: 30 year old female complaining of epigastric pain and burning on and off for several weeks but worse in the past 3-4 days. She states that last night she was curled up in a fetal position with severe epigastric pain. She is also had a decrease in appetite for a couple of weeks associated with nausea. Denies vomiting, denies reflux, denies dyspepsia or heartburn. She has taken Tums, Pepto-Bismol and other OTC medications without relief. Nothing seems to make it worse. She is currently taking no medications. She had been taking Wellbutrin but stopped it thinking that might be the cause of her discomfort. She is not currently having any epigastric pain or discomfort. She does have nausea.   Past Medical History  Diagnosis Date  . UTI (lower urinary tract infection)   . BV (bacterial vaginosis)   . History of postpartum depression, currently pregnant   . Anxiety   . Depression    Past Surgical History  Procedure Laterality Date  . Rhinoplasty    . Mouth surgery     Family History  Problem Relation Age of Onset  . Alcohol abuse Neg Hx   . Arthritis Neg Hx   . Asthma Neg Hx   . Birth defects Neg Hx   . Cancer Neg Hx   . COPD Neg Hx   . Depression Neg Hx   . Drug abuse Neg Hx   . Early death Neg Hx   . Hearing loss Neg Hx   . Hyperlipidemia Neg Hx   . Learning disabilities Neg Hx   . Kidney disease Neg Hx   . Mental illness Neg Hx   . Mental retardation Neg Hx   . Miscarriages / Stillbirths Neg Hx   . Stroke Neg Hx   . Vision loss Neg Hx   . Thyroid disease Maternal Grandmother   . Bipolar disorder Maternal Grandmother   . Diabetes Paternal Grandmother   . Heart disease Paternal Grandmother    . Hypertension Paternal Grandmother    Social History  Substance Use Topics  . Smoking status: Former Smoker    Types: Cigarettes  . Smokeless tobacco: Never Used  . Alcohol Use: No   OB History    Gravida Para Term Preterm AB TAB SAB Ectopic Multiple Living   3 3 3  0 0 0 0 0 0 3     Review of Systems  Constitutional: Positive for activity change and appetite change. Negative for fever.  HENT: Negative.   Respiratory: Negative.  Negative for cough and shortness of breath.   Cardiovascular: Negative for chest pain.  Gastrointestinal: Positive for nausea and abdominal pain. Negative for vomiting, diarrhea, constipation and blood in stool.       Patient states she has been having regular and normal bowel movements.  Genitourinary: Negative.   Musculoskeletal: Negative.   Skin: Negative.   Neurological: Negative.     Allergies  Latex  Home Medications   Prior to Admission medications   Medication Sig Start Date End Date Taking? Authorizing Provider  calcium carbonate (TUMS - DOSED IN MG ELEMENTAL CALCIUM) 500 MG chewable tablet Chew 1 tablet by mouth daily.   Yes Historical Provider, MD  Biotin 5000 MCG CAPS Take by mouth.    Historical Provider,  MD  cephALEXin (KEFLEX) 500 MG capsule Take 500 mg by mouth 3 (three) times daily.    Historical Provider, MD  etonogestrel-ethinyl estradiol (NUVARING) 0.12-0.015 MG/24HR vaginal ring Insert vaginally and leave in place for 3 consecutive weeks, then remove for 1 week. 11/14/12   Adam Phenix, MD  ondansetron (ZOFRAN) 4 MG tablet Take 1 tablet (4 mg total) by mouth every 8 (eight) hours as needed for nausea. May cause constipation 06/27/15   Hayden Rasmussen, NP   Meds Ordered and Administered this Visit   Medications  ondansetron (ZOFRAN-ODT) disintegrating tablet 4 mg (4 mg Oral Given 06/27/15 1447)    BP 119/76 mmHg  Pulse 75  Temp(Src) 98.2 F (36.8 C) (Oral)  Resp 14  SpO2 100%  LMP 06/06/2015 No data found.   Physical  Exam  Constitutional: She is oriented to person, place, and time. She appears well-developed and well-nourished. No distress.  Eyes: Conjunctivae and EOM are normal.  Neck: Normal range of motion. Neck supple.  Cardiovascular: Normal rate, regular rhythm and normal heart sounds.   Pulmonary/Chest: Effort normal and breath sounds normal. No respiratory distress.  Abdominal: Soft. She exhibits no distension and no mass. There is no tenderness. There is no rebound and no guarding.  Bowel sounds present but hypoactive. Normal pitch. Right lower quadrant and right mid abdomen percusses tympanic. Remainder of abdomen percusses dull.  No tenderness over the epigastrium or other areas of the abdomen.  Musculoskeletal: She exhibits no edema.  Neurological: She is alert and oriented to person, place, and time. She exhibits normal muscle tone.  Skin: Skin is warm and dry.  Psychiatric: She has a normal mood and affect.  Nursing note and vitals reviewed.   ED Course  Procedures (including critical care time)  Labs Review Labs Reviewed  POCT I-STAT, CHEM 8 - Abnormal; Notable for the following:    Potassium 3.4 (*)    BUN 4 (*)    All other components within normal limits  POCT URINALYSIS DIP (DEVICE)  POCT PREGNANCY, URINE  POCT H PYLORI SCREEN   Results for orders placed or performed during the hospital encounter of 06/27/15  POCT urinalysis dip (device)  Result Value Ref Range   Glucose, UA NEGATIVE NEGATIVE mg/dL   Bilirubin Urine NEGATIVE NEGATIVE   Ketones, ur NEGATIVE NEGATIVE mg/dL   Specific Gravity, Urine 1.020 1.005 - 1.030   Hgb urine dipstick NEGATIVE NEGATIVE   pH 6.0 5.0 - 8.0   Protein, ur NEGATIVE NEGATIVE mg/dL   Urobilinogen, UA 0.2 0.0 - 1.0 mg/dL   Nitrite NEGATIVE NEGATIVE   Leukocytes, UA NEGATIVE NEGATIVE  I-STAT, chem 8  Result Value Ref Range   Sodium 142 135 - 145 mmol/L   Potassium 3.4 (L) 3.5 - 5.1 mmol/L   Chloride 103 101 - 111 mmol/L   BUN 4 (L) 6 -  20 mg/dL   Creatinine, Ser 1.61 0.44 - 1.00 mg/dL   Glucose, Bld 79 65 - 99 mg/dL   Calcium, Ion 0.96 0.45 - 1.23 mmol/L   TCO2 24 0 - 100 mmol/L   Hemoglobin 12.9 12.0 - 15.0 g/dL   HCT 40.9 81.1 - 91.4 %  Pregnancy, urine POC  Result Value Ref Range   Preg Test, Ur NEGATIVE NEGATIVE  H.pylori screen, POC  Result Value Ref Range   H. PYLORI SCREEN, POC NEGATIVE NEGATIVE    Imaging Review Dg Abd 1 View  06/27/2015  CLINICAL DATA:  Burning type abdominal pain for 3 days EXAM:  ABDOMEN - 1 VIEW COMPARISON:  August 08, 2008 FINDINGS: There is moderate stool throughout the colon. There is no bowel dilatation or air-fluid level suggesting obstruction. No free air. No abnormal calcifications. IMPRESSION: No bowel obstruction or free air.  Moderate stool throughout colon. Electronically Signed   By: Bretta Bang III M.D.   On: 06/27/2015 15:39     Visual Acuity Review  Right Eye Distance:   Left Eye Distance:   Bilateral Distance:    Right Eye Near:   Left Eye Near:    Bilateral Near:         MDM   1. Epigastric abdominal pain   2. Nausea   3. Slow transit constipation   4. Decreased appetite     After the patient had received her Zofran she states she felt better and actually now feels hungry for the first time in several days. Meds ordered this encounter  Medications  . calcium carbonate (TUMS - DOSED IN MG ELEMENTAL CALCIUM) 500 MG chewable tablet    Sig: Chew 1 tablet by mouth daily.  . ondansetron (ZOFRAN-ODT) disintegrating tablet 4 mg    Sig:   . ondansetron (ZOFRAN) 4 MG tablet    Sig: Take 1 tablet (4 mg total) by mouth every 8 (eight) hours as needed for nausea. May cause constipation    Dispense:  12 tablet    Refill:  0    Order Specific Question:  Supervising Provider    Answer:  Bradd Canary D [phone]   Miralax as dir Miralax as directed. Start with 2 glasses (doses) today. Repeat tomorrow as needed. Follow with your PCP as needed, rech promptly if  worse. Increase fluids, gatorade.   Hayden Rasmussen, NP 06/27/15 1550

## 2016-02-17 HISTORY — PX: APPENDECTOMY: SHX54

## 2016-04-09 DIAGNOSIS — M797 Fibromyalgia: Secondary | ICD-10-CM | POA: Insufficient documentation

## 2018-11-11 ENCOUNTER — Inpatient Hospital Stay (HOSPITAL_COMMUNITY)
Admission: AD | Admit: 2018-11-11 | Discharge: 2018-11-11 | Disposition: A | Discharge status: Home / Self Care | Payer: Medicaid Other | Attending: Obstetrics and Gynecology | Admitting: Obstetrics and Gynecology

## 2018-11-11 ENCOUNTER — Encounter (HOSPITAL_COMMUNITY): Payer: Self-pay | Admitting: *Deleted

## 2018-11-11 ENCOUNTER — Other Ambulatory Visit: Payer: Self-pay

## 2018-11-11 ENCOUNTER — Inpatient Hospital Stay (HOSPITAL_COMMUNITY): Payer: Medicaid Other

## 2018-11-11 ENCOUNTER — Other Ambulatory Visit: Payer: Self-pay | Admitting: Obstetrics and Gynecology

## 2018-11-11 DIAGNOSIS — O3680X0 Pregnancy with inconclusive fetal viability, not applicable or unspecified: Secondary | ICD-10-CM | POA: Diagnosis not present

## 2018-11-11 DIAGNOSIS — R109 Unspecified abdominal pain: Secondary | ICD-10-CM | POA: Diagnosis present

## 2018-11-11 DIAGNOSIS — Z3A01 Less than 8 weeks gestation of pregnancy: Secondary | ICD-10-CM | POA: Diagnosis not present

## 2018-11-11 DIAGNOSIS — O26891 Other specified pregnancy related conditions, first trimester: Secondary | ICD-10-CM | POA: Insufficient documentation

## 2018-11-11 DIAGNOSIS — Z79899 Other long term (current) drug therapy: Secondary | ICD-10-CM | POA: Diagnosis not present

## 2018-11-11 DIAGNOSIS — Z87891 Personal history of nicotine dependence: Secondary | ICD-10-CM | POA: Diagnosis not present

## 2018-11-11 DIAGNOSIS — R1030 Lower abdominal pain, unspecified: Secondary | ICD-10-CM | POA: Diagnosis not present

## 2018-11-11 DIAGNOSIS — R0602 Shortness of breath: Secondary | ICD-10-CM | POA: Diagnosis not present

## 2018-11-11 DIAGNOSIS — R110 Nausea: Secondary | ICD-10-CM | POA: Diagnosis not present

## 2018-11-11 DIAGNOSIS — O209 Hemorrhage in early pregnancy, unspecified: Secondary | ICD-10-CM | POA: Insufficient documentation

## 2018-11-11 LAB — URINALYSIS, ROUTINE W REFLEX MICROSCOPIC
Bilirubin Urine: NEGATIVE
Glucose, UA: NEGATIVE mg/dL
Hgb urine dipstick: NEGATIVE
Ketones, ur: NEGATIVE mg/dL
Nitrite: NEGATIVE
Protein, ur: 30 mg/dL — AB
Specific Gravity, Urine: 1.023 (ref 1.005–1.030)
WBC, UA: >50 WBC/hpf — ABNORMAL HIGH (ref 0–5)
pH: 5 (ref 5.0–8.0)

## 2018-11-11 LAB — WET PREP, GENITAL
Clue Cells Wet Prep HPF POC: NONE SEEN
Sperm: NONE SEEN
Trich, Wet Prep: NONE SEEN
Yeast Wet Prep HPF POC: NONE SEEN

## 2018-11-11 LAB — CBC
HCT: 31.7 % — ABNORMAL LOW (ref 36.0–46.0)
Hemoglobin: 9.9 g/dL — ABNORMAL LOW (ref 12.0–15.0)
MCH: 21.5 pg — ABNORMAL LOW (ref 26.0–34.0)
MCHC: 31.2 g/dL (ref 30.0–36.0)
MCV: 68.8 fL — ABNORMAL LOW (ref 80.0–100.0)
Platelets: 270 10*3/uL (ref 150–400)
RBC: 4.61 MIL/uL (ref 3.87–5.11)
RDW: 16.5 % — ABNORMAL HIGH (ref 11.5–15.5)
WBC: 4.9 10*3/uL (ref 4.0–10.5)
nRBC: 0 % (ref 0.0–0.2)

## 2018-11-11 LAB — POCT PREGNANCY, URINE: Preg Test, Ur: POSITIVE — AB

## 2018-11-11 LAB — HCG, QUANTITATIVE, PREGNANCY: hCG, Beta Chain, Quant, S: 1091 m[IU]/mL — ABNORMAL HIGH (ref <5)

## 2018-11-11 NOTE — MAU Note (Signed)
Presents with c/o abdominal cramping for approximately 1 week, denies VB.  Reports +UPT x2 .  Reports took 1 dose of plan B in mid July.  LMP 8/26/202, reports VB heavy and a lot of clots

## 2018-11-11 NOTE — [facility] Provider Note (Addendum)
History     CSN: [id number]  Arrival date and time: 11/11/18 1340   First Provider Initiated Contact with Patient 11/11/18 1441      Chief Complaint  Patient presents with  . Abdominal Pain  . Possible Pregnancy   Breanna [name] is a 33 y.o [id number] who presents to [facility]U due to abdominal /pain.  Patient's main concern is abdominal cramping/pain that has been bothering her for ~1 week. She details the use of plan B in mid July. This year patient has had irregular menses. In Mid August, patient details extensive vaginal bleeding, enough to saturate multiple pads, with the presence of nickel sized clots. This past week, patient notes onset of gradual lower abdominal pain that is crampy in nature. The pain waxes and wanes. At its worst the pain is 6/10. It is currently 2/10. Associated symptom of vaginal spotting. In addition, patient notes she had a sexual encounter ~10 days ago with her current partner in which latex condoms were used. Patient is allergic to latex. Since that encounter, patient notes vaginal irritation and stinging with urination. Denies urinary frequency, vaginal discharge.  Review of systems positive for nausea, fatigue and shortness of breath with exertion. Denies chest pains, lightheadedness, chest pain, RUQ pain or leg swelling.   Patient notes her past pregnancies were not met with any complications. Denies past uterine or pelvic surgeries. She had trichomonas ~10 years ago. No history of pelvic inflammatory disease.   Abdominal Pain Associated symptoms include dysuria. Pertinent negatives include no constipation, diarrhea or headaches.  Possible Pregnancy Associated symptoms include abdominal pain. Pertinent negatives include no chest pain or headaches.    OB History    Gravida  5   Para  3   Term  3   Preterm  0   AB  1   Living  3     SAB  1   TAB  0   Ectopic  0   Multiple  0   Live Births  3           Past Medical History:  Diagnosis  Date  . Anxiety   . BV (bacterial vaginosis)   . Depression   . History of postpartum depression, currently pregnant   . UTI (lower urinary tract infection)     Past Surgical History:  Procedure Laterality Date  . APPENDECTOMY  2018  . MOUTH SURGERY    . RHINOPLASTY      Family History  Problem Relation Age of Onset  . Thyroid disease Maternal Grandmother   . Bipolar disorder Maternal Grandmother   . Diabetes Paternal Grandmother   . Heart disease Paternal Grandmother   . Hypertension Paternal Grandmother   . Alcohol abuse Neg Hx   . Arthritis Neg Hx   . Asthma Neg Hx   . Birth defects Neg Hx   . Cancer Neg Hx   . COPD Neg Hx   . Depression Neg Hx   . Drug abuse Neg Hx   . Early death Neg Hx   . Hearing loss Neg Hx   . Hyperlipidemia Neg Hx   . Learning disabilities Neg Hx   . Kidney disease Neg Hx   . Mental illness Neg Hx   . Mental retardation Neg Hx   . Miscarriages / Stillbirths Neg Hx   . Stroke Neg Hx   . Vision loss Neg Hx     Social History   Tobacco Use  . Smoking status: Former Smoker  Types: Cigarettes  . Smokeless tobacco: Never Used  Substance Use Topics  . Alcohol use: No  . Drug use: No    Allergies:  Allergies  Allergen Reactions  . Latex Itching    Vaginal irritation    Medications Prior to Admission  Medication Sig Dispense Refill Last Dose  . Biotin 5000 MCG CAPS Take by mouth.     . calcium carbonate (TUMS - DOSED IN MG ELEMENTAL CALCIUM) 500 MG chewable tablet Chew 1 tablet by mouth daily.     . cephALEXin (KEFLEX) 500 MG capsule Take 500 mg by mouth 3 (three) times daily.     Marland Kitchen etonogestrel-ethinyl estradiol (NUVARING) 0.12-0.015 MG/24HR vaginal ring Insert vaginally and leave in place for 3 consecutive weeks, then remove for 1 week. 1 each 12   . ondansetron (ZOFRAN) 4 MG tablet Take 1 tablet (4 mg total) by mouth every 8 (eight) hours as needed for nausea. May cause constipation 12 tablet 0     Review of Systems   Respiratory: Negative for chest tightness and shortness of breath.   Cardiovascular: Negative for chest pain.  Gastrointestinal: Positive for abdominal pain. Negative for constipation and diarrhea.  Genitourinary: Positive for dysuria and vaginal bleeding. Negative for vaginal discharge.  Neurological: Negative for dizziness, light-headedness and headaches.   Physical Exam   Blood pressure (!) 117/58, pulse 80, temperature 98.8 F (37.1 C), temperature source Oral, resp. rate 18, height '5\' 4"'$  (1.626 m), weight 55 kg, last menstrual period 10/12/2018, SpO2 100 %, currently breastfeeding.  Physical Exam  Constitutional: She is oriented to person, place, and time. She appears well-developed and well-nourished. No distress.  HENT:  Head: Normocephalic and atraumatic.  Eyes: No scleral icterus.  Cardiovascular: Normal rate and regular rhythm.  Respiratory: Effort normal and breath sounds normal. No respiratory distress.  GI: Soft. Bowel sounds are normal. There is abdominal tenderness.  Lower abdominal tenderness w/o rebound or guarding   Genitourinary: There is no tenderness, lesion or injury on the right labia. There is no tenderness, lesion or injury on the left labia. Cervix exhibits discharge. Cervix exhibits no friability. Right adnexum displays no fullness. Left adnexum displays no fullness.    Vaginal discharge present.     No vaginal bleeding.  No bleeding in the vagina.    No foreign body in the vagina.     No signs of injury in the vagina.     Genitourinary Comments: L. Vaginal wall pinched during examination.    Neurological: She is alert and oriented to person, place, and time.   Results for orders placed or performed during the hospital encounter of 11/11/18 (from the past 24 hour(s))  Pregnancy, urine POC     Status: Abnormal   Collection Time: 11/11/18  1:51 PM  Result Value Ref Range   Preg Test, Ur POSITIVE (A) NEGATIVE  Urinalysis, Routine w reflex microscopic      Status: Abnormal   Collection Time: 11/11/18  2:01 PM  Result Value Ref Range   Color, Urine YELLOW YELLOW   APPearance CLOUDY (A) CLEAR   Specific Gravity, Urine 1.023 1.005 - 1.030   pH 5.0 5.0 - 8.0   Glucose, UA NEGATIVE NEGATIVE mg/dL   Hgb urine dipstick NEGATIVE NEGATIVE   Bilirubin Urine NEGATIVE NEGATIVE   Ketones, ur NEGATIVE NEGATIVE mg/dL   Protein, ur 30 (A) NEGATIVE mg/dL   Nitrite NEGATIVE NEGATIVE   Leukocytes,Ua LARGE (A) NEGATIVE   RBC / HPF 6-10 0 - 5 RBC/hpf  WBC, UA >50 (H) 0 - 5 WBC/hpf   Bacteria, UA RARE (A) NONE SEEN   Squamous Epithelial / LPF 21-50 0 - 5   Mucus PRESENT   Wet prep, genital     Status: Abnormal   Collection Time: 11/11/18  3:28 PM  Result Value Ref Range   Yeast Wet Prep HPF POC NONE SEEN NONE SEEN   Trich, Wet Prep NONE SEEN NONE SEEN   Clue Cells Wet Prep HPF POC NONE SEEN NONE SEEN   WBC, Wet Prep HPF POC MANY (A) NONE SEEN   Sperm NONE SEEN   hCG, quantitative, pregnancy     Status: Abnormal   Collection Time: 11/11/18  3:41 PM  Result Value Ref Range   hCG, Beta Chain, Quant, S 1,091 (H) <5 mIU/mL  CBC     Status: Abnormal   Collection Time: 11/11/18  3:41 PM  Result Value Ref Range   WBC 4.9 4.0 - 10.5 K/uL   RBC 4.61 3.87 - 5.11 MIL/uL   Hemoglobin 9.9 (L) 12.0 - 15.0 g/dL   HCT 31.7 (L) 36.0 - 46.0 %   MCV 68.8 (L) 80.0 - 100.0 fL   MCH 21.5 (L) 26.0 - 34.0 pg   MCHC 31.2 30.0 - 36.0 g/dL   RDW 16.5 (H) 11.5 - 15.5 %   Platelets 270 150 - 400 K/uL   nRBC 0.0 0.0 - 0.2 %   US Ob Less Than 14 Weeks With Ob Transvaginal  Result Date: 11/11/2018 CLINICAL DATA:  Pain for 1 week, gestational age by last menstrual period 4 weeks 2 days. Heavy vaginal bleeding and clots. EXAM: OBSTETRIC <14 WK [place] AND TRANSVAGINAL OB US TECHNIQUE: Both transabdominal and transvaginal ultrasound examinations were performed for complete evaluation of the gestation as well as the maternal uterus, adnexal regions, and pelvic cul-de-sac.  Transvaginal technique was performed to assess early pregnancy. COMPARISON:  11/07/2012 FINDINGS: Intrauterine gestational sac: Single Yolk sac:  Not Visualized. Embryo:  Not Visualized. Cardiac Activity: Not Visualized. MSD: 2.65 mm   4 w   6 d Subchorionic hemorrhage:  None visualized. Maternal uterus/adnexae: Trace free fluid in the pelvis. Uterus is normal. Ovaries are normal. IMPRESSION: 1. Gestational sac without yolk sac or embryo is visualized in the uterine fundus. Two week follow-up is suggested to assess viability. Electronically Signed   By: Zetta Bills M.D.   On: 11/11/2018 16:39    MAU Course  Procedures  MDM -- Positive urine pregnancy test  -- Ultrasound (09/25): demonstrated intrauterine gestational sac w/o yolk sac or fetal pole -- hCG quantitative: 1,091, currently consistent with gestational age -- Wet prep: Many WBC, urine culture pending  -- Urinalysis demonstrated >50 WBC w/large amount leukocytes  -- ABO: B positive    Assessment and Plan  1. Ectopic pregnancy r/o  2. STD evaluation   -- U/S IUGS w/o yolks sac; 48 hr follow-up w/quantitative hCG  -- wet prep, urinalysis suggest possible infection; urine culture & GC pending - will follow-up as needed    Floreen Comber A Brinson 11/11/2018, 2:55 PM    I confirm that I have verified the information documented in the medical student's note and that I have also personally reperformed the history, physical exam and all medical decision making activities of this service and have verified that all service and findings are accurately documented in this student's note.   A/P: Pregnancy of unknown anatomic location - Offered to schedule an appt at [facility] on Monday 11/15/18 for repeat HCG,  but patient has to work out of town Monday and prefers to f/u on Sunday or Tuesday - Emphasized the importance of the f/u and the timing of f/u  Sunday - F/U Sunday at 1500 for repeat HCG - Information provided on ectopic preg     Abdominal pain during pregnancy in first trimester  - Advised to take Tylenol 1000 mg po prn pain - Information provided on abd pain in preg - Safe Medications in Pregnancy list given  - Discharge home - Patient verbalized an understanding of the plan of care and agrees.    Laury Deep, CNM 11/11/2018 6:15 PM

## 2018-11-11 NOTE — Discharge Instructions (Signed)

## 2018-11-11 NOTE — MAU Note (Signed)
Not enough urine for culture tube 

## 2018-11-12 LAB — CULTURE, OB URINE
Culture: <10000 — AB
Special Requests: NORMAL

## 2018-11-12 LAB — HIV ANTIBODY (ROUTINE TESTING W REFLEX): HIV Screen 4th Generation wRfx: NONREACTIVE

## 2018-11-14 LAB — CERVICOVAGINAL ANCILLARY ONLY
Chlamydia: POSITIVE — AB
Neisseria Gonorrhea: NEGATIVE

## 2018-11-15 ENCOUNTER — Telehealth (HOSPITAL_COMMUNITY): Payer: Self-pay

## 2018-11-15 ENCOUNTER — Telehealth: Payer: Self-pay | Admitting: Advanced Practice Midwife

## 2018-11-15 NOTE — Telephone Encounter (Signed)
Returned patient's call about missed repeat Quant hCG. Patient unable to be seen at [place] to return to [facility]U ASAP for repeat Quant. Patient hesitant to return due to feeling "passed around". Explained that formal medical recommendation is for repeat Quant to assess viability of pregnancy, if she is uncomfortable with [facility]U there are other places to seek care, I can coordinate PRN. Pt verbalizes intent to return to MAU.  Mallie Snooks, MSN, CNM Certified Nurse Midwife, [facility] 11/15/18 11:17 AM

## 2018-11-16 ENCOUNTER — Inpatient Hospital Stay (HOSPITAL_COMMUNITY)
Admission: AD | Admit: 2018-11-16 | Discharge: 2018-11-16 | Disposition: A | Discharge status: Home / Self Care | Payer: Medicaid Other | Attending: Obstetrics & Gynecology | Admitting: Obstetrics & Gynecology

## 2018-11-16 ENCOUNTER — Telehealth: Payer: Self-pay | Admitting: Medical

## 2018-11-16 ENCOUNTER — Other Ambulatory Visit: Payer: Self-pay

## 2018-11-16 DIAGNOSIS — R102 Pelvic and perineal pain: Secondary | ICD-10-CM | POA: Diagnosis not present

## 2018-11-16 DIAGNOSIS — O26891 Other specified pregnancy related conditions, first trimester: Secondary | ICD-10-CM | POA: Diagnosis present

## 2018-11-16 DIAGNOSIS — Z3A01 Less than 8 weeks gestation of pregnancy: Secondary | ICD-10-CM | POA: Insufficient documentation

## 2018-11-16 DIAGNOSIS — Z87891 Personal history of nicotine dependence: Secondary | ICD-10-CM | POA: Insufficient documentation

## 2018-11-16 DIAGNOSIS — O3680X0 Pregnancy with inconclusive fetal viability, not applicable or unspecified: Secondary | ICD-10-CM

## 2018-11-16 DIAGNOSIS — E349 Endocrine disorder, unspecified: Secondary | ICD-10-CM

## 2018-11-16 DIAGNOSIS — A749 Chlamydial infection, unspecified: Secondary | ICD-10-CM

## 2018-11-16 LAB — HCG, QUANTITATIVE, PREGNANCY: hCG, Beta Chain, Quant, S: 7175 m[IU]/mL — ABNORMAL HIGH (ref <5)

## 2018-11-16 MED ORDER — AZITHROMYCIN 250 MG PO TABS
1000.0000 mg | ORAL_TABLET | Freq: Once | ORAL | Status: AC
  Administered 2018-11-16: 14:00:00 1000 mg via ORAL
  Filled 2018-11-16: qty 4

## 2018-11-16 MED ORDER — AZITHROMYCIN 250 MG PO TABS: 1000.0000 mg | ORAL_TABLET | Freq: Once | ORAL | 0 refills | Status: AC

## 2018-11-16 NOTE — MAU Note (Signed)
.   Breanna [name] is a 33 y.o. at [redacted]w[redacted]d here in MAU reporting: she was suppose to follow up for a repeat QUANT on Monday but was not able to. Denies any pain or VB LMP:  Onset of complaint: 10/12/18 Pain score: 0 Vitals:   11/16/18 1109  BP: 124/70  Pulse: 86  Resp: 16  Temp: 98.1 F (36.7 C)     FHT: Lab orders placed from triage: QUANT

## 2018-11-16 NOTE — Telephone Encounter (Addendum)
Breanna [name] tested positive for  Chlamydia. Patient was called by RN and allergies and pharmacy confirmed. Rx sent to pharmacy of choice.   Breanna Redden, PA-C 11/16/2018 3:03 PM      ----- Message from Bjorn Loser, RN sent at 11/15/2018  4:36 PM EDT ----- This patient tested positive for :  Chlamydia  She is allergic to "Latex" I have informed the patient of her results and confirmed her pharmacy is correct in her chart. Please send Rx.   Thank you,   Cecille Rubin

## 2018-11-16 NOTE — Discharge Instructions (Signed)
First Trimester of Pregnancy The first trimester of pregnancy is from week 1 until the end of week 13 (months 1 through 3). A week after a sperm fertilizes an egg, the egg will implant on the wall of the uterus. This embryo will begin to develop into a baby. Genes from you and your partner will form the baby. The female genes will determine whether the baby will be a boy or a girl. At 6-8 weeks, the eyes and face will be formed, and the heartbeat can be seen on ultrasound. At the end of 12 weeks, all the baby's organs will be formed. Now that you are pregnant, you will want to do everything you can to have a healthy baby. Two of the most important things are to get good prenatal care and to follow your health care provider's instructions. Prenatal care is all the medical care you receive before the baby's birth. This care will help prevent, find, and treat any problems during the pregnancy and childbirth. Body changes during your first trimester Your body goes through many changes during pregnancy. The changes vary from woman to woman.  You may gain or lose a couple of pounds at first.  You may feel sick to your stomach (nauseous) and you may throw up (vomit). If the vomiting is uncontrollable, call your health care provider.  You may tire easily.  You may develop headaches that can be relieved by medicines. All medicines should be approved by your health care provider.  You may urinate more often. Painful urination may mean you have a bladder infection.  You may develop heartburn as a result of your pregnancy.  You may develop constipation because certain hormones are causing the muscles that push stool through your intestines to slow down.  You may develop hemorrhoids or swollen veins (varicose veins).  Your breasts may begin to grow larger and become tender. Your nipples may stick out more, and the tissue that surrounds them (areola) may become darker.  Your gums may bleed and may be  sensitive to brushing and flossing.  Dark spots or blotches (chloasma, mask of pregnancy) may develop on your face. This will likely fade after the baby is born.  Your menstrual periods will stop.  You may have a loss of appetite.  You may develop cravings for certain kinds of food.  You may have changes in your emotions from day to day, such as being excited to be pregnant or being concerned that something may go wrong with the pregnancy and baby.  You may have more vivid and strange dreams.  You may have changes in your hair. These can include thickening of your hair, rapid growth, and changes in texture. Some women also have hair loss during or after pregnancy, or hair that feels dry or thin. Your hair will most likely return to normal after your baby is born. What to expect at prenatal visits During a routine prenatal visit:  You will be weighed to make sure you and the baby are growing normally.  Your blood pressure will be taken.  Your abdomen will be measured to track your baby's growth.  The fetal heartbeat will be listened to between weeks 10 and 14 of your pregnancy.  Test results from any previous visits will be discussed. Your health care provider may ask you:  How you are feeling.  If you are feeling the baby move.  If you have had any abnormal symptoms, such as leaking fluid, bleeding, severe headaches, or abdominal   cramping.  If you are using any tobacco products, including cigarettes, chewing tobacco, and electronic cigarettes.  If you have any questions. Other tests that may be performed during your first trimester include:  Blood tests to find your blood type and to check for the presence of any previous infections. The tests will also be used to check for low iron levels (anemia) and protein on red blood cells (Rh antibodies). Depending on your risk factors, or if you previously had diabetes during pregnancy, you may have tests to check for high blood sugar  that affects pregnant women (gestational diabetes).  Urine tests to check for infections, diabetes, or protein in the urine.  An ultrasound to confirm the proper growth and development of the baby.  Fetal screens for spinal cord problems (spina bifida) and Down syndrome.  HIV (human immunodeficiency virus) testing. Routine prenatal testing includes screening for HIV, unless you choose not to have this test.  You may need other tests to make sure you and the baby are doing well. Follow these instructions at home: Medicines  Follow your health care provider's instructions regarding medicine use. Specific medicines may be either safe or unsafe to take during pregnancy.  Take a prenatal vitamin that contains at least 600 micrograms (mcg) of folic acid.  If you develop constipation, try taking a stool softener if your health care provider approves. Eating and drinking   Eat a balanced diet that includes fresh fruits and vegetables, whole grains, good sources of protein such as meat, eggs, or tofu, and low-fat dairy. Your health care provider will help you determine the amount of weight gain that is right for you.  Avoid raw meat and uncooked cheese. These carry germs that can cause birth defects in the baby.  Eating four or five small meals rather than three large meals a day may help relieve nausea and vomiting. If you start to feel nauseous, eating a few soda crackers can be helpful. Drinking liquids between meals, instead of during meals, also seems to help ease nausea and vomiting.  Limit foods that are high in fat and processed sugars, such as fried and sweet foods.  To prevent constipation: ? Eat foods that are high in fiber, such as fresh fruits and vegetables, whole grains, and beans. ? Drink enough fluid to keep your urine clear or pale yellow. Activity  Exercise only as directed by your health care provider. Most women can continue their usual exercise routine during  pregnancy. Try to exercise for 30 minutes at least 5 days a week. Exercising will help you: ? Control your weight. ? Stay in shape. ? Be prepared for labor and delivery.  Experiencing pain or cramping in the lower abdomen or lower back is a good sign that you should stop exercising. Check with your health care provider before continuing with normal exercises.  Try to avoid standing for long periods of time. Move your legs often if you must stand in one place for a long time.  Avoid heavy lifting.  Wear low-heeled shoes and practice good posture.  You may continue to have sex unless your health care provider tells you not to. Relieving pain and discomfort  Wear a good support bra to relieve breast tenderness.  Take warm sitz baths to soothe any pain or discomfort caused by hemorrhoids. Use hemorrhoid cream if your health care provider approves.  Rest with your legs elevated if you have leg cramps or low back pain.  If you develop varicose veins in   your legs, wear support hose. Elevate your feet for 15 minutes, 3-4 times a day. Limit salt in your diet. Prenatal care  Schedule your prenatal visits by the twelfth week of pregnancy. They are usually scheduled monthly at first, then more often in the last 2 months before delivery.  Write down your questions. Take them to your prenatal visits.  Keep all your prenatal visits as told by your health care provider. This is important. Safety  Wear your seat belt at all times when driving.  Make a list of emergency phone numbers, including numbers for family, friends, the hospital, and police and fire departments. General instructions  Ask your health care provider for a referral to a local prenatal education class. Begin classes no later than the beginning of month 6 of your pregnancy.  Ask for help if you have counseling or nutritional needs during pregnancy. Your health care provider can offer advice or refer you to specialists for help  with various needs.  Do not use hot tubs, steam rooms, or saunas.  Do not douche or use tampons or scented sanitary pads.  Do not cross your legs for long periods of time.  Avoid cat litter boxes and soil used by cats. These carry germs that can cause birth defects in the baby and possibly loss of the fetus by miscarriage or stillbirth.  Avoid all smoking, herbs, alcohol, and medicines not prescribed by your health care provider. Chemicals in these products affect the formation and growth of the baby.  Do not use any products that contain nicotine or tobacco, such as cigarettes and e-cigarettes. If you need help quitting, ask your health care provider. You may receive counseling support and other resources to help you quit.  Schedule a dentist appointment. At home, brush your teeth with a soft toothbrush and be gentle when you floss. Contact a health care provider if:  You have dizziness.  You have mild pelvic cramps, pelvic pressure, or nagging pain in the abdominal area.  You have persistent nausea, vomiting, or diarrhea.  You have a bad smelling vaginal discharge.  You have pain when you urinate.  You notice increased swelling in your face, hands, legs, or ankles.  You are exposed to fifth disease or chickenpox.  You are exposed to German measles (rubella) and have never had it. Get help right away if:  You have a fever.  You are leaking fluid from your vagina.  You have spotting or bleeding from your vagina.  You have severe abdominal cramping or pain.  You have rapid weight gain or loss.  You vomit blood or material that looks like coffee grounds.  You develop a severe headache.  You have shortness of breath.  You have any kind of trauma, such as from a fall or a car accident. Summary  The first trimester of pregnancy is from week 1 until the end of week 13 (months 1 through 3).  Your body goes through many changes during pregnancy. The changes vary from  woman to woman.  You will have routine prenatal visits. During those visits, your health care provider will examine you, discuss any test results you may have, and talk with you about how you are feeling. This information is not intended to replace advice given to you by your health care provider. Make sure you discuss any questions you have with your health care provider. Document Released: 01/27/2001 Document Revised: 01/15/2017 Document Reviewed: 01/15/2016 Elsevier Patient Education  2020 [employer]  

## 2018-11-16 NOTE — MAU Provider Note (Signed)
History     CSN: [id number]  Arrival date and time: 11/16/18 1101   None     Chief Complaint  Patient presents with  . Labs Only   Breanna [name] is a 33 y.o. [id number] at [redacted]w[redacted]d who presents for FU HCG. She denies any vaginal bleeding or pain at this time. She was to be here on 9/27 for FU HCG but was unable to make it. So today we are approx 2 - 48 hour cycles from last HCG. Patient also requesting treatment for chlamydia today.  Pelvic Pain The patient's primary symptoms include pelvic pain. This is a new problem. The current episode started in the past 7 days. The problem occurs intermittently. The problem has been unchanged. The patient is experiencing no pain. The problem affects both sides. She is pregnant. The vaginal discharge was normal. There has been no bleeding. Nothing aggravates the symptoms.    OB History    Gravida  5   Para  3   Term  3   Preterm  0   AB  1   Living  3     SAB  1   TAB  0   Ectopic  0   Multiple  0   Live Births  3           Past Medical History:  Diagnosis Date  . Anxiety   . BV (bacterial vaginosis)   . Depression   . History of postpartum depression, currently pregnant   . UTI (lower urinary tract infection)     Past Surgical History:  Procedure Laterality Date  . APPENDECTOMY  2018  . MOUTH SURGERY    . RHINOPLASTY      Family History  Problem Relation Age of Onset  . Thyroid disease Maternal Grandmother   . Bipolar disorder Maternal Grandmother   . Diabetes Paternal Grandmother   . Heart disease Paternal Grandmother   . Hypertension Paternal Grandmother   . Alcohol abuse Neg Hx   . Arthritis Neg Hx   . Asthma Neg Hx   . Birth defects Neg Hx   . Cancer Neg Hx   . COPD Neg Hx   . Depression Neg Hx   . Drug abuse Neg Hx   . Early death Neg Hx   . Hearing loss Neg Hx   . Hyperlipidemia Neg Hx   . Learning disabilities Neg Hx   . Kidney disease Neg Hx   . Mental illness Neg Hx   . Mental retardation  Neg Hx   . Miscarriages / Stillbirths Neg Hx   . Stroke Neg Hx   . Vision loss Neg Hx     Social History   Tobacco Use  . Smoking status: Former Smoker    Types: Cigarettes  . Smokeless tobacco: Never Used  Substance Use Topics  . Alcohol use: No  . Drug use: No    Allergies:  Allergies  Allergen Reactions  . Latex Itching    Vaginal irritation    Medications Prior to Admission  Medication Sig Dispense Refill Last Dose  . calcium carbonate (TUMS - DOSED IN MG ELEMENTAL CALCIUM) 500 MG chewable tablet Chew 1 tablet by mouth daily.     . ondansetron (ZOFRAN) 4 MG tablet Take 1 tablet (4 mg total) by mouth every 8 (eight) hours as needed for nausea. May cause constipation 12 tablet 0     Review of Systems  Genitourinary: Positive for pelvic pain.  All other systems reviewed  and are negative.  Physical Exam   Blood pressure 124/70, pulse 86, temperature 98.1 F (36.7 C), resp. rate 16, last menstrual period 10/12/2018, currently breastfeeding.  Physical Exam  Nursing note and vitals reviewed. Constitutional: She is oriented to person, place, and time. She appears well-developed and well-nourished. No distress.  HENT:  Head: Normocephalic.  Cardiovascular: Normal rate.  Respiratory: Effort normal.  Musculoskeletal: Normal range of motion.  Neurological: She is alert and oriented to person, place, and time.  Psychiatric: She has a normal mood and affect.     Results for LOIS, SLAGEL (MRN [id number]) as of 11/16/2018 12:43  Ref. Range 11/11/2018 15:41 11/11/2018 16:05 11/16/2018 11:16  HCG, Beta Chain, Quant, S Latest Ref Range: <5 mIU/mL 1,091 (H)  7,175 (H)   MAU Course  Procedures  MDM 1g Azithromycin given here today HCG increasing as expected    Assessment and Plan   1. Pregnancy, location unknown   2. Elevated serum hCG    DC home 1st Trimester precautions  Bleeding precautions Ectopic precautions RX: none Return to MAU as needed FU [place]  ordered for 2 weeks out  Follow-up Information    [facility] Follow up.   Why: They will call you with an appointment          Thressa Sheller DNP, CNM  11/16/18  1:10 PM

## 2018-11-28 ENCOUNTER — Encounter: Payer: Self-pay | Admitting: Family Medicine

## 2018-11-28 ENCOUNTER — Ambulatory Visit (HOSPITAL_COMMUNITY)
Admission: RE | Admit: 2018-11-28 | Discharge: 2018-11-28 | Disposition: A | Discharge status: Home / Self Care | Payer: Medicaid Other | Source: Ambulatory Visit | Attending: Advanced Practice Midwife | Admitting: Advanced Practice Midwife

## 2018-11-28 ENCOUNTER — Other Ambulatory Visit: Payer: Self-pay

## 2018-11-28 ENCOUNTER — Ambulatory Visit (INDEPENDENT_AMBULATORY_CARE_PROVIDER_SITE_OTHER): Payer: Self-pay

## 2018-11-28 DIAGNOSIS — O3680X0 Pregnancy with inconclusive fetal viability, not applicable or unspecified: Secondary | ICD-10-CM | POA: Diagnosis not present

## 2018-11-28 DIAGNOSIS — E349 Endocrine disorder, unspecified: Secondary | ICD-10-CM | POA: Insufficient documentation

## 2018-11-28 DIAGNOSIS — Z712 Person consulting for explanation of examination or test findings: Secondary | ICD-10-CM

## 2018-11-28 NOTE — Progress Notes (Signed)
Pt here today for results following viability [place]. Results reviewed with Ilda Basset, MD who finds single, living IUP and recommends pt begin prenatal care. Pt is 6w 6d today with EDD of 07/18/19. Results and dating reviewed with pt; pictures provided. Reviewed allergies and medications. [place] office to provide proof of pregnancy letter and to schedule prenatal care. Pt verbalizes understanding and has no other questions at this time.   Apolonio Schneiders RN 11/28/18

## 2018-11-28 NOTE — Progress Notes (Signed)
Patient seen and assessed by nursing staff during this encounter. I have reviewed the chart and agree with the documentation and plan.  Hattie Pine, MD 11/28/2018 2:39 PM    

## 2018-12-25 ENCOUNTER — Telehealth: Payer: Medicaid Other | Admitting: Family

## 2018-12-25 DIAGNOSIS — R112 Nausea with vomiting, unspecified: Secondary | ICD-10-CM

## 2018-12-25 NOTE — Progress Notes (Signed)
Based on what you shared with me, I feel your condition warrants further evaluation and I recommend that you be seen for a face to face office visit.  Given your nausea and vomiting has been going on over three days you need to be seen face to face to be evaluated.  NOTE: If you entered your credit card information for this eVisit, you will not be charged. You may see a "hold" on your card for the $35 but that hold will drop off and you will not have a charge processed.  If you are having a true medical emergency please call 911.     For an urgent face to face visit, [facility] has four urgent care centers for your convenience:    NEW:  [facility]   [email or web address]/[email or web address]+[place]%2c+[address]+[place]%2[address]+[place]+[address]                                                   [place], [place] [address] .  Monday - Friday 10 am - 6 pm    . [facility]    [phone]                  Get Driving Directions  [address] [place], [place] [address] . 10 am to 8 pm Monday-Friday . 12 pm to 8 pm Saturday-Sunday   . [facility] [place]                  Get Driving Directions  [address], [place], [place] [address] . 8 am to 8 pm Monday-Friday . 9 am to 6 pm Saturday . 11 am to 6 pm Sunday     . [facility] [place]                  Get Driving Directions   [address] [place], [place] [address] . 8 am to 8 pm Monday-Friday . 8 am to 4 pm Saturday-Sunday    . [facility] at                     Get Driving Directions  [phone]  [address] [place], [place] [address]  . Monday-Friday, 12 PM to 6 PM    Your e-visit answers were reviewed by a board certified advanced  clinical practitioner to complete your personal care plan.  Thank you for using e-Visits.'

## 2018-12-27 ENCOUNTER — Other Ambulatory Visit: Payer: Self-pay

## 2018-12-27 ENCOUNTER — Telehealth: Payer: Self-pay | Admitting: General Practice

## 2018-12-27 ENCOUNTER — Encounter (HOSPITAL_COMMUNITY): Payer: Self-pay

## 2018-12-27 ENCOUNTER — Inpatient Hospital Stay (HOSPITAL_COMMUNITY)
Admission: AD | Admit: 2018-12-27 | Discharge: 2018-12-27 | Disposition: A | Discharge status: Home / Self Care | Payer: Medicaid Other | Attending: Obstetrics and Gynecology | Admitting: Obstetrics and Gynecology

## 2018-12-27 DIAGNOSIS — Z87891 Personal history of nicotine dependence: Secondary | ICD-10-CM | POA: Diagnosis not present

## 2018-12-27 DIAGNOSIS — Z3A10 10 weeks gestation of pregnancy: Secondary | ICD-10-CM | POA: Diagnosis not present

## 2018-12-27 DIAGNOSIS — O26899 Other specified pregnancy related conditions, unspecified trimester: Secondary | ICD-10-CM

## 2018-12-27 DIAGNOSIS — O99891 Other specified diseases and conditions complicating pregnancy: Secondary | ICD-10-CM | POA: Diagnosis not present

## 2018-12-27 DIAGNOSIS — R110 Nausea: Secondary | ICD-10-CM | POA: Insufficient documentation

## 2018-12-27 DIAGNOSIS — Z91040 Latex allergy status: Secondary | ICD-10-CM | POA: Diagnosis not present

## 2018-12-27 DIAGNOSIS — R12 Heartburn: Secondary | ICD-10-CM | POA: Diagnosis not present

## 2018-12-27 DIAGNOSIS — O26891 Other specified pregnancy related conditions, first trimester: Secondary | ICD-10-CM | POA: Diagnosis not present

## 2018-12-27 DIAGNOSIS — Z833 Family history of diabetes mellitus: Secondary | ICD-10-CM | POA: Insufficient documentation

## 2018-12-27 LAB — WET PREP, GENITAL
Clue Cells Wet Prep HPF POC: NONE SEEN
Sperm: NONE SEEN
Trich, Wet Prep: NONE SEEN
Yeast Wet Prep HPF POC: NONE SEEN

## 2018-12-27 LAB — URINALYSIS, ROUTINE W REFLEX MICROSCOPIC
Bilirubin Urine: NEGATIVE
Glucose, UA: NEGATIVE mg/dL
Hgb urine dipstick: NEGATIVE
Ketones, ur: NEGATIVE mg/dL
Nitrite: NEGATIVE
Protein, ur: NEGATIVE mg/dL
Specific Gravity, Urine: 1.025 (ref 1.005–1.030)
pH: 6 (ref 5.0–8.0)

## 2018-12-27 MED ORDER — ONDANSETRON 4 MG PO TBDP
8.0000 mg | ORAL_TABLET | Freq: Once | ORAL | Status: AC
  Administered 2018-12-27: 18:00:00 8 mg via ORAL
  Filled 2018-12-27: qty 2

## 2018-12-27 MED ORDER — DOXYLAMINE-PYRIDOXINE 10-10 MG PO TBEC: DELAYED_RELEASE_TABLET | ORAL | 0 refills | Status: DC

## 2018-12-27 MED ORDER — OMEPRAZOLE 20 MG PO CPDR: 20.0000 mg | DELAYED_RELEASE_CAPSULE | Freq: Every day | ORAL | 0 refills | Status: DC

## 2018-12-27 NOTE — Discharge Instructions (Signed)
Heartburn During Pregnancy °Heartburn is a type of pain or discomfort that can happen in the throat or chest. It is often described as a burning sensation. Heartburn is common during pregnancy because: °· A hormone (progesterone) that is released during pregnancy may relax the valve (lower esophageal sphincter, or LES) that separates the esophagus from the stomach. This allows stomach acid to move up into the esophagus, causing heartburn. °· The uterus gets larger and pushes up on the stomach, which pushes more acid into the esophagus. This is especially true in the later stages of pregnancy. °Heartburn usually goes away or gets better after giving birth. °What are the causes? °Heartburn is caused by stomach acid backing up into the esophagus (reflux). Reflux can be triggered by: °· Changing hormone levels. °· Large meals. °· Certain foods and beverages, such as coffee, chocolate, onions, and peppermint. °· Exercise. °· Increased stomach acid production. °What increases the risk? °You are more likely to experience heartburn during pregnancy if you: °· Had heartburn prior to becoming pregnant. °· Have been pregnant more than once before. °· Are overweight or obese. °The likelihood that you will get heartburn also increases as you get farther along in your pregnancy, especially during the last trimester. °What are the signs or symptoms? °Symptoms of this condition include: °· Burning pain in the chest or lower throat. °· Bitter taste in the mouth. °· Coughing. °· Problems swallowing. °· Vomiting. °· Hoarse voice. °· Asthma. °Symptoms may get worse when you lie down or bend over. Symptoms are often worse at night. °How is this diagnosed? °This condition is diagnosed based on: °· Your medical history. °· Your symptoms. °· Blood tests to check for a certain type of bacteria associated with heartburn. °· Whether taking heartburn medicine relieves your symptoms. °· Examination of the stomach and esophagus using a tube with  a light and camera on the end (endoscopy). °How is this treated? °Treatment varies depending on how severe your symptoms are. Your health care provider may recommend: °· Over-the-counter medicines (antacids or acid reducers) for mild heartburn. °· Prescription medicines to decrease stomach acid or to protect your stomach lining. °· Certain changes in your diet. °· Raising the head of your bed so it is higher than the foot of the bed. This helps prevent stomach acid from backing up into the esophagus when you are lying down. °Follow these instructions at home: °Eating and drinking °· Do not drink alcohol during your pregnancy. °· Identify foods and beverages that make your symptoms worse, and avoid them. °· Beverages that you may want to avoid include: °? Coffee and tea (with or without caffeine). °? Energy drinks and sports drinks. °? Carbonated drinks or sodas. °? Citrus fruit juices. °· Foods that you may want to avoid include: °? Chocolate and cocoa. °? Peppermint and mint flavorings. °? Garlic, onions, and horseradish. °? Spicy and acidic foods, including peppers, chili powder, curry powder, vinegar, hot sauces, and barbecue sauce. °? Citrus fruits, such as oranges, lemons, and limes. °? Tomato-based foods, such as red sauce, chili, and salsa. °? Fried and fatty foods, such as donuts, french fries, potato chips, and high-fat dressings. °? High-fat meats, such as hot dogs, cold cuts, sausage, ham, and bacon. °? High-fat dairy items, such as whole milk, butter, and cheese. °· Eat small, frequent meals instead of large meals. °· Avoid drinking large amounts of liquid with your meals. °· Avoid eating meals during the 2-3 hours before bedtime. °· Avoid lying down right   after you eat.  Do not exercise right after you eat. Medicines  Take over-the-counter and prescription medicines only as told by your health care provider.  Do not take aspirin, ibuprofen, or other NSAIDs unless your health care provider tells  you to do that.  You may be instructed to avoid medicines that contain sodium bicarbonate. General instructions   If directed, raise the head of your bed about 6 inches (15 cm) by putting blocks under the legs. Sleeping with more pillows does not effectively relieve heartburn because it only changes the position of your head.  Do not use any products that contain nicotine or tobacco, such as cigarettes and e-cigarettes. If you need help quitting, ask your health care provider.  Wear loose-fitting clothing.  Try to reduce your stress, such as with yoga or meditation. If you need help managing stress, ask your health care provider.  Maintain a healthy weight. If you are overweight, work with your health care provider to safely lose weight.  Keep all follow-up visits as told by your health care provider. This is important. Contact a health care provider if:  You develop new symptoms.  Your symptoms do not improve with treatment.  You have unexplained weight loss.  You have difficulty swallowing.  You make loud sounds when you breathe (wheeze).  You have a cough that does not go away.  You have frequent heartburn for more than 2 weeks.  You have nausea or vomiting that does not get better with treatment.  You have pain in your abdomen. Get help right away if:  You have severe chest pain that spreads to your arm, neck, or jaw.  You feel sweaty, dizzy, or light-headed.  You have shortness of breath.  You have pain when swallowing.  You vomit, and your vomit looks like blood or coffee grounds.  Your stool is bloody or black. This information is not intended to replace advice given to you by your health care provider. Make sure you discuss any questions you have with your health care provider. Document Released: 01/31/2000 Document Revised: 05/03/2017 Document Reviewed: 10/21/2015 Elsevier Patient Education  [place].     Morning Sickness  Morning sickness  is when a woman feels nauseous during pregnancy. This nauseous feeling may or may not come with vomiting. It often occurs in the morning, but it can be a problem at any time of day. Morning sickness is most common during the first trimester. In some cases, it may continue throughout pregnancy. Although morning sickness is unpleasant, it is usually harmless unless the woman develops severe and continual vomiting (hyperemesis gravidarum), a condition that requires more intense treatment. What are the causes? The exact cause of this condition is not known, but it seems to be related to normal hormonal changes that occur in pregnancy. What increases the risk? You are more likely to develop this condition if:  You experienced nausea or vomiting before your pregnancy.  You had morning sickness during a previous pregnancy.  You are pregnant with more than one baby, such as twins. What are the signs or symptoms? Symptoms of this condition include:  Nausea.  Vomiting. How is this diagnosed? This condition is usually diagnosed based on your signs and symptoms. How is this treated? In many cases, treatment is not needed for this condition. Making some changes to what you eat may help to control symptoms. Your health care provider may also prescribe or recommend:  Vitamin B6 supplements.  Anti-nausea medicines.  Ginger. Follow  these instructions at home: Medicines  Take over-the-counter and prescription medicines only as told by your health care provider. Do not use any prescription, over-the-counter, or herbal medicines for morning sickness without first talking with your health care provider.  Taking multivitamins before getting pregnant can prevent or decrease the severity of morning sickness in most women. Eating and drinking  Eat a piece of dry toast or crackers before getting out of bed in the morning.  Eat 5 or 6 small meals a day.  Eat dry and bland foods, such as rice or a baked  potato. Foods that are high in carbohydrates are often helpful.  Avoid greasy, fatty, and spicy foods.  Have someone cook for you if the smell of any food causes nausea and vomiting.  If you feel nauseous after taking prenatal vitamins, take the vitamins at night or with a snack.  Snack on protein foods between meals if you are hungry. Nuts, yogurt, and cheese are good options.  Drink fluids throughout the day.  Try ginger ale made with real ginger, ginger tea made from fresh grated ginger, or ginger candies. General instructions  Do not use any products that contain nicotine or tobacco, such as cigarettes and e-cigarettes. If you need help quitting, ask your health care provider.  Get an air purifier to keep the air in your house free of odors.  Get plenty of fresh air.  Try to avoid odors that trigger your nausea.  Consider trying these methods to help relieve symptoms: ? Wearing an acupressure wristband. These wristbands are often worn for seasickness. ? Acupuncture. Contact a health care provider if:  Your home remedies are not working and you need medicine.  You feel dizzy or light-headed.  You are losing weight. Get help right away if:  You have persistent and uncontrolled nausea and vomiting.  You faint.  You have severe pain in your abdomen. Summary  Morning sickness is when a woman feels nauseous during pregnancy. This nauseous feeling may or may not come with vomiting.  Morning sickness is most common during the first trimester.  It often occurs in the morning, but it can be a problem at any time of day.  In many cases, treatment is not needed for this condition. Making some changes to what you eat may help to control symptoms. This information is not intended to replace advice given to you by your health care provider. Make sure you discuss any questions you have with your health care provider. Document Released: 03/26/2006 Document Revised: 01/15/2017  Document Reviewed: 03/07/2016 Elsevier Patient Education  2020 [employer].

## 2018-12-27 NOTE — MAU Provider Note (Addendum)
History   CSN: [id number]  Arrival date and time: 12/27/18 1546   None    Chief Complaint  Patient presents with  . Nausea   HPI Breanna [name] is a 33 y.o. [id number] at [redacted]w[redacted]d by U/S who presents today with nausea. She reports feeling nauseous since 3 weeks pregnancy. She is not vomiting anymore, but cannot eat anything due to nausea and indigestion she experiences after eating. She states she has not vomited in a 1 week. This morning she ate crackers and water and felt she was "burping fire". She also reports constipation since Sunday when she had one large, hard bowel movement.  Patient also reports vaginal discharge. When last seen in the MAU on 11/11/18 she was found to have Chlamydia. Pt states it improved with treatment, but has since returned. She reports her partner was tested, found positive and was also treated. She has not had intercourse since before last test.   Reports headache for the past 3 days which she attributes to caffeine withdrawals. Also c/o dizziness.  Denies vaginal bleeding or abdominal pain.   OB History    Gravida  5   Para  3   Term  3   Preterm  0   AB  1   Living  3     SAB  1   TAB  0   Ectopic  0   Multiple  0   Live Births  3           Past Medical History:  Diagnosis Date  . Anxiety   . BV (bacterial vaginosis)   . Depression   . History of postpartum depression, currently pregnant   . UTI (lower urinary tract infection)     Past Surgical History:  Procedure Laterality Date  . APPENDECTOMY  2018  . APPENDECTOMY  2017  . MOUTH SURGERY    . RHINOPLASTY      Family History  Problem Relation Age of Onset  . Thyroid disease Maternal Grandmother   . Bipolar disorder Maternal Grandmother   . Diabetes Paternal Grandmother   . Heart disease Paternal Grandmother   . Hypertension Paternal Grandmother   . Alcohol abuse Neg Hx   . Arthritis Neg Hx   . Asthma Neg Hx   . Birth defects Neg Hx   . Cancer Neg Hx   . COPD  Neg Hx   . Depression Neg Hx   . Drug abuse Neg Hx   . Early death Neg Hx   . Hearing loss Neg Hx   . Hyperlipidemia Neg Hx   . Learning disabilities Neg Hx   . Kidney disease Neg Hx   . Mental illness Neg Hx   . Mental retardation Neg Hx   . Miscarriages / Stillbirths Neg Hx   . Stroke Neg Hx   . Vision loss Neg Hx     Social History   Tobacco Use  . Smoking status: Former Smoker    Types: Cigarettes  . Smokeless tobacco: Never Used  Substance Use Topics  . Alcohol use: No  . Drug use: No    Allergies:  Allergies  Allergen Reactions  . Latex Itching    Vaginal irritation    Medications Prior to Admission  Medication Sig Dispense Refill Last Dose  . calcium carbonate (TUMS - DOSED IN MG ELEMENTAL CALCIUM) 500 MG chewable tablet Chew 1 tablet by mouth daily.   12/26/2018 at Unknown time  . Prenatal MV & Min w/FA-DHA (PRENATAL ADULT  GUMMY/DHA/FA PO) Take by mouth.   12/27/2018 at Unknown time    Review of Systems  Constitutional: Negative.   Eyes: Negative for visual disturbance.  Gastrointestinal: Positive for constipation, nausea and vomiting. Negative for abdominal pain and diarrhea.       Reports positive for pyrosis  Genitourinary: Positive for vaginal discharge. Negative for dysuria, frequency, urgency and vaginal bleeding.  Neurological: Positive for dizziness and headaches.   Physical Exam   Blood pressure 108/64, pulse 74, temperature 99.3 F (37.4 C), resp. rate 20, weight 55.9 kg, last menstrual period 10/12/2018, SpO2 96 %, currently breastfeeding.  Physical Exam  Nursing note and vitals reviewed. Constitutional: She is oriented to person, place, and time. She appears well-developed and well-nourished.  HENT:  Head: Normocephalic.  Eyes: Conjunctivae are normal.  Neck: Normal range of motion.  Cardiovascular: Normal rate.  Respiratory: Effort normal.  GI: Soft.  Neurological: She is alert and oriented to person, place, and time.  Skin: Skin is  warm and dry.  Psychiatric: She has a normal mood and affect.   Results for orders placed or performed during the hospital encounter of 12/27/18 (from the past 24 hour(s))  Urinalysis, Routine w reflex microscopic     Status: Abnormal   Collection Time: 12/27/18  4:13 PM  Result Value Ref Range   Color, Urine YELLOW YELLOW   APPearance CLEAR CLEAR   Specific Gravity, Urine 1.025 1.005 - 1.030   pH 6.0 5.0 - 8.0   Glucose, UA NEGATIVE NEGATIVE mg/dL   Hgb urine dipstick NEGATIVE NEGATIVE   Bilirubin Urine NEGATIVE NEGATIVE   Ketones, ur NEGATIVE NEGATIVE mg/dL   Protein, ur NEGATIVE NEGATIVE mg/dL   Nitrite NEGATIVE NEGATIVE   Leukocytes,Ua MODERATE (A) NEGATIVE   RBC / HPF 0-5 0 - 5 RBC/hpf   WBC, UA 0-5 0 - 5 WBC/hpf   Bacteria, UA RARE (A) NONE SEEN   Squamous Epithelial / LPF 0-5 0 - 5   Mucus PRESENT   Wet prep, genital     Status: Abnormal   Collection Time: 12/27/18  4:51 PM   Specimen: PATH Cytology Cervicovaginal Ancillary Only  Result Value Ref Range   Yeast Wet Prep HPF POC NONE SEEN NONE SEEN   Trich, Wet Prep NONE SEEN NONE SEEN   Clue Cells Wet Prep HPF POC NONE SEEN NONE SEEN   WBC, Wet Prep HPF POC MANY (A) NONE SEEN   Sperm NONE SEEN     MAU Course  Procedures Orders Placed This Encounter  Procedures  . Wet prep, genital  . Urinalysis, Routine w reflex microscopic   MDM - UA without evidence of dehydration, no ketones - Wet prep, GC/CT swabbed - Zofran given to assist with nausea before discharge  Assessment and Plan   1. Heartburn during pregnancy in first trimester   2. Pregnancy related nausea, antepartum   3. [redacted] weeks gestation of pregnancy    Plan - Pt is not dehydrated based off of UA, given Zofran in MAU to assist with nausea and will send home with rx for Diclegis and Omeprazole to take as needed - Wet prep with many WBCs, otherwise normal. GC/CT pending. Pt would like to defer treatment until results return - [facility] - All questions answered, patient discharged in stable condition  [name] 12/27/2018, 5:51 PM   I confirm that I have verified the information documented in the physician assistant student's note and that I have also personally performed the  history, physical exam and all medical decision making activities of this service and have verified that all service and findings are accurately documented in this student's note.   FHT present via doppler. U/a without signs of dehydration. Zofran given in MAU prior to discharge.  Discussed diclegis & daily omeprazole prescription for use at home.   Judeth Horn, NP 12/27/2018 6:43 PM

## 2018-12-27 NOTE — MAU Note (Signed)
Pt states that she is dehydrated. Pt states that she is not currently vomiting, but can not eat. Pt reports not being able to keep water down. Pt reports fatigue. Pt reports nausea since [redacted] weeks pregnant.  Denies vaginal bleeding.   Reports a small amount of rectal bleeding one time on Saturday. Pt reports she thinks its because she has been constipated.

## 2018-12-27 NOTE — Telephone Encounter (Signed)
Patient called and left message on nurse voicemail line stating she needs a prescription for nausea. Patient reports nausea and lightheadedness off/on for the past 6 weeks. Patient states she is starting to feel dehydrated and isn't sure what she should do.   Called patient and asked if she was able to eat/drink much and she states not really. Patient reports nausea is getting the point where she hasn't been able to keep much down and she is starting to feel dizzy and weak. Patient reports calling out of work yesterday as a result. Advised she should go to [facility] for fluids/hydration. Discussed they can provide her with medication while there to get her feeling better and can also send Rx in. Patient verbalized understanding & had no questions.

## 2018-12-29 ENCOUNTER — Ambulatory Visit (INDEPENDENT_AMBULATORY_CARE_PROVIDER_SITE_OTHER): Payer: Self-pay | Admitting: *Deleted

## 2018-12-29 ENCOUNTER — Other Ambulatory Visit: Payer: Self-pay

## 2018-12-29 DIAGNOSIS — O219 Vomiting of pregnancy, unspecified: Secondary | ICD-10-CM

## 2018-12-29 DIAGNOSIS — F3289 Other specified depressive episodes: Secondary | ICD-10-CM

## 2018-12-29 DIAGNOSIS — F419 Anxiety disorder, unspecified: Secondary | ICD-10-CM

## 2018-12-29 DIAGNOSIS — F329 Major depressive disorder, single episode, unspecified: Secondary | ICD-10-CM | POA: Insufficient documentation

## 2018-12-29 DIAGNOSIS — O3680X0 Pregnancy with inconclusive fetal viability, not applicable or unspecified: Secondary | ICD-10-CM

## 2018-12-29 DIAGNOSIS — O0990 Supervision of high risk pregnancy, unspecified, unspecified trimester: Secondary | ICD-10-CM

## 2018-12-29 DIAGNOSIS — Z3480 Encounter for supervision of other normal pregnancy, unspecified trimester: Secondary | ICD-10-CM | POA: Insufficient documentation

## 2018-12-29 DIAGNOSIS — F4310 Post-traumatic stress disorder, unspecified: Secondary | ICD-10-CM

## 2018-12-29 DIAGNOSIS — A6000 Herpesviral infection of urogenital system, unspecified: Secondary | ICD-10-CM

## 2018-12-29 DIAGNOSIS — F32A Depression, unspecified: Secondary | ICD-10-CM | POA: Insufficient documentation

## 2018-12-29 DIAGNOSIS — R109 Unspecified abdominal pain: Secondary | ICD-10-CM

## 2018-12-29 LAB — GC/CHLAMYDIA PROBE AMP (~~LOC~~) NOT AT ARMC
Chlamydia: NEGATIVE
Comment: NEGATIVE
Comment: NORMAL
Neisseria Gonorrhea: NEGATIVE

## 2018-12-29 MED ORDER — PROMETHAZINE HCL 25 MG PO TABS: 25.0000 mg | ORAL_TABLET | Freq: Four times a day (QID) | ORAL | 0 refills | Status: DC | PRN

## 2018-12-29 NOTE — Patient Instructions (Signed)

## 2018-12-29 NOTE — Progress Notes (Signed)
Chart reviewed for nurse visit. Agree with plan of care.   Starr Lake, CNM 12/29/2018 1:33 PM

## 2018-12-29 NOTE — Progress Notes (Signed)
I connected with  Breanna [name] on 12/29/18 at  9:30 AM EST by telephone and verified that I am speaking with the correct person using two identifiers.   I discussed the limitations, risks, security and privacy concerns of performing an evaluation and management service by telephone and the availability of in person appointments. I also discussed with the patient that there may be a patient responsible charge related to this service. The patient expressed understanding and agreed to proceed. Explained I am completing her New OB Intake today. We discussed Her EDD and that it is based on  sure LMP . I reviewed her allergies, meds, OB History, Medical /Surgical history, and appropriate screenings. I explained I will send her the [employer] app- app sent to her while on phone.I explained we ask our patients to check blood pressure at home weekly and she states she has a  blood pressure cuff  And knows how to use it.  I asked her to bring the blood pressure cuff with her to her first ob appointment so we can show her how to use it if she needs any help. Explained  then we will have her take her blood pressure weekly and enter into the app. Explained she will have some visits in office and some virtually. She already has MyChart and I assisted her with downloading the  app. Reviewed appointment date/ time with her , our location and to wear mask, no visitors. Explained she will have exam, ob bloodwork, hemoglobin a1C, cbg , genetic testing if desired, pap if needed. I scheduled an [place] at 19 weeks and she will get the appointment via [place]. She voices understanding.   Duvall Comes,RN 12/29/2018  9:43 AM

## 2019-01-02 ENCOUNTER — Telehealth: Payer: Self-pay | Admitting: Student

## 2019-01-02 NOTE — Telephone Encounter (Signed)
Attempted to call patient about her appointment on 11/17 @ 8:35. No answer left voicemail instructing patient to wear a face mask for the entire appointment and no visitors are allowed during the visit. Patient instructed not to attend the appointment if she was any symptoms. Symptom list and office number left.

## 2019-01-03 ENCOUNTER — Encounter: Payer: Self-pay | Admitting: Student

## 2019-01-03 ENCOUNTER — Other Ambulatory Visit: Payer: Self-pay

## 2019-01-03 ENCOUNTER — Encounter: Payer: Self-pay | Admitting: *Deleted

## 2019-01-03 ENCOUNTER — Ambulatory Visit (INDEPENDENT_AMBULATORY_CARE_PROVIDER_SITE_OTHER): Payer: Medicaid Other | Admitting: Student

## 2019-01-03 VITALS — BP 108/61 | HR 76 | Temp 98.4°F | Wt 124.0 lb

## 2019-01-03 DIAGNOSIS — Z124 Encounter for screening for malignant neoplasm of cervix: Secondary | ICD-10-CM

## 2019-01-03 DIAGNOSIS — Z113 Encounter for screening for infections with a predominantly sexual mode of transmission: Secondary | ICD-10-CM

## 2019-01-03 DIAGNOSIS — Z1151 Encounter for screening for human papillomavirus (HPV): Secondary | ICD-10-CM

## 2019-01-03 DIAGNOSIS — Z3A11 11 weeks gestation of pregnancy: Secondary | ICD-10-CM | POA: Diagnosis not present

## 2019-01-03 DIAGNOSIS — O0991 Supervision of high risk pregnancy, unspecified, first trimester: Secondary | ICD-10-CM

## 2019-01-03 DIAGNOSIS — O0990 Supervision of high risk pregnancy, unspecified, unspecified trimester: Secondary | ICD-10-CM

## 2019-01-03 DIAGNOSIS — N898 Other specified noninflammatory disorders of vagina: Secondary | ICD-10-CM | POA: Diagnosis not present

## 2019-01-03 DIAGNOSIS — O26891 Other specified pregnancy related conditions, first trimester: Secondary | ICD-10-CM

## 2019-01-03 NOTE — Progress Notes (Signed)
  Subjective:    Breanna [name] is being seen today for her first obstetrical visit.  This is not a planned pregnancy. She is at [redacted]w[redacted]d gestation. Her obstetrical history is significant for nothing. . Relationship with FOB: significant other, not living together. Patient does intend to breast feed. Pregnancy history fully reviewed.  Patient reports no complaints.  Review of Systems:   Review of Systems  Constitutional: Negative.   HENT: Negative.   Respiratory: Negative.   Cardiovascular: Negative.   Gastrointestinal: Negative.   Musculoskeletal: Negative.   Neurological: Negative.   Psychiatric/Behavioral: Negative.     Objective:     BP 108/61   Pulse 76   Temp 98.4 F (36.9 C)   Wt 124 lb (56.2 kg)   LMP 10/12/2018   BMI 21.28 kg/m  Physical Exam  Constitutional: She appears well-developed.  HENT:  Head: Normocephalic.  Neck: Normal range of motion.  GI: Soft.  Genitourinary:    Vagina normal.   Neurological: She is alert.  Skin: Skin is warm and dry.    Exam    Assessment:    Pregnancy: [id number] Patient Active Problem List   Diagnosis Date Noted  . Supervision of high risk pregnancy, antepartum 12/29/2018  . PTSD (post-traumatic stress disorder)   . Anxiety   . Depression   . Herpes genitalia   . Pregnancy of unknown anatomic location 11/11/2018  . Abdominal cramping 11/11/2018  . Fibromyalgia 04/09/2016  . Vaginal delivery 09/25/2012  . Mental disorders of mother, antepartum 09/13/2012       Plan:     Initial labs drawn. Prenatal vitamins. Problem list reviewed and updated. AFP3 discussed: declined. Role of ultrasound in pregnancy discussed; fetal survey: declined. Amniocentesis discussed: not indicated. Follow up in 8 weeks. 50 % of 30  min visit spent on counseling and coordination of care.  -Welcomed patient to practice -HgbA1c today -declined genetic testing -has her own BP cuff and will activate [employer] -she has MyChart and  understands next visit will be virtual -follow up with [facility] on Thursday.   Mervyn Skeeters Breanna [name] 01/03/2019

## 2019-01-03 NOTE — Progress Notes (Signed)
Medicaid Home Form Completed-01/03/2019

## 2019-01-03 NOTE — Progress Notes (Signed)
Per Crystal, pt left without giving blood. Pt was called to come back to do Blood work & also to provide Urine.

## 2019-01-03 NOTE — Patient Instructions (Signed)

## 2019-01-04 LAB — CYTOLOGY - PAP
Chlamydia: NEGATIVE
Comment: NEGATIVE
Comment: NEGATIVE
Comment: NORMAL
Diagnosis: NEGATIVE
High risk HPV: NEGATIVE
Neisseria Gonorrhea: NEGATIVE

## 2019-01-04 LAB — CERVICOVAGINAL ANCILLARY ONLY
Bacterial Vaginitis (gardnerella): NEGATIVE
Candida Glabrata: NEGATIVE
Candida Vaginitis: NEGATIVE
Comment: NEGATIVE
Comment: NEGATIVE
Comment: NEGATIVE
Comment: NEGATIVE
Trichomonas: NEGATIVE

## 2019-01-05 ENCOUNTER — Ambulatory Visit: Payer: Self-pay | Admitting: Clinical

## 2019-01-05 ENCOUNTER — Other Ambulatory Visit: Payer: Self-pay

## 2019-01-05 DIAGNOSIS — F332 Major depressive disorder, recurrent severe without psychotic features: Secondary | ICD-10-CM

## 2019-01-05 NOTE — BH Specialist Note (Addendum)
Integrated Behavioral Health via Telemedicine Video Visit  Patient and/or legal guardian verbally consented to [facility] services about presenting concerns and psychiatric consultation as appropriate.  01/05/2019 Breanna [name] Breanna [name] [id number]  Number of Integrated Behavioral Health visits: 1 Session Start time: 1:50  Session End time: 2:41 Total time: 33  Referring Provider: Luna [name], CNM Type of Visit: Video Patient/Family location: Home [facility] Provider location: [facility] All persons participating in visit: Patient Breanna [name] and [facility] Breanna [name]    Confirmed patient's address: Yes  Confirmed patient's phone number: Yes  Any changes to demographics: No   Confirmed patient's insurance: Yes  Any changes to patient's insurance: No   Discussed confidentiality: Yes   I connected with Breanna [name]  by a video enabled telemedicine application and verified that I am speaking with the correct person using two identifiers.     I discussed the limitations of evaluation and management by telemedicine and the availability of in person appointments.  I discussed that the purpose of this visit is to provide behavioral health care while limiting exposure to the novel coronavirus.   Discussed there is a possibility of technology failure and discussed alternative modes of communication if that failure occurs.  I discussed that engaging in this video visit, they consent to the provision of behavioral healthcare and the services will be billed under their insurance.  Patient and/or legal guardian expressed understanding and consented to video visit: Yes   PRESENTING CONCERNS: Patient and/or family reports the following symptoms/concerns: Pt states her primary concern today is increased depression and anxiety, attributed to life stress, history of depression, and current unexpected pregnancy (more nausea/sickness than previous pregnancies). Pt tried several  medications in the past, prior to losing her insurance, and prefers no medication at this time. Pt may be open to medication postpartum only, as attributes older child's behavior issues to potential side effects of antidepressant she took in a previous pregnancy. Pt would like to improve her sleep, and open to self-coping strategy.  Duration of problem: Current pregnancy; Severity of problem: severe  STRENGTHS (Protective Factors/Coping Skills): Supportive FOB  GOALS ADDRESSED: Patient will: 1.  Reduce symptoms of: anxiety, depression and stress  2.  Increase knowledge and/or ability of: coping skills and healthy habits  3.  Demonstrate ability to: Increase healthy adjustment to current life circumstances and Increase motivation to adhere to plan of care  INTERVENTIONS: Interventions utilized:  Mindfulness or Management consultant, Sleep Hygiene and Psychoeducation and/or Health Education Standardized Assessments completed: GAD-7 and PHQ 9  ASSESSMENT: Patient currently experiencing Major depressive disorder, recurrent, severe, without psychotic features.   Patient may benefit from psychoeducation and brief therapeutic interventions regarding coping with symptoms of depression and anxiety .  PLAN: 1. Follow up with behavioral health clinician on : Two weeks 2. Behavioral recommendations:  -CALM relaxation breathing exercise twice daily (morning; at bedtime) -Continue with sleep sounds at night (TV/ thunder, etc.) -Continue taking prenatal vitamin daily -Consider adding iron-rich foods to diet (as prenatals taking do not have) -Sign [id number] consent sent to text (for [facility] referral) 3. Referral(s): Integrated [facility] (In Clinic)  I discussed the assessment and treatment plan with the patient and/or parent/guardian. They were provided an opportunity to ask questions and all were answered. They agreed with the plan and demonstrated an understanding of the instructions.    They were advised to call back or seek an in-person evaluation if the symptoms worsen or if the condition fails to improve as anticipated.  Breanna [name] Breanna [name]    Depression screen [facility] 2/9 01/05/2019 12/29/2018  Decreased Interest 3 3  Down, Depressed, Hopeless 1 2  PHQ - 2 Score 4 5  Altered sleeping 3 3  Tired, decreased energy 3 3  Change in appetite 3 3  Feeling bad or failure about yourself  3 1  Trouble concentrating 3 3  Moving slowly or fidgety/restless 3 0  Suicidal thoughts 0 0  PHQ-9 Score 22 18   GAD 7 : Generalized Anxiety Score 01/05/2019 12/29/2018  Nervous, Anxious, on Edge 3 3  Control/stop worrying 3 3  Worry too much - different things 3 3  Trouble relaxing 3 3  Restless 3 0  Easily annoyed or irritable 3 3  Afraid - awful might happen 3 3  Total GAD 7 Score 21 18

## 2019-01-18 NOTE — BH Specialist Note (Signed)
Integrated Behavioral Health via Telemedicine Video Visit  01/18/2019 Breanna [name] [id number]  Number of Integrated Behavioral Health visits: 2 Session Start time: 8:18  Session End time: 8:41 Total time: 23  Referring Provider: Maye Hides, CNM Type of Visit: Video Patient/Family location: Home [facility] Provider location: [facility] All persons participating in visit: Patient Breanna [name] and Breanna [name]    Confirmed patient's address: Yes  Confirmed patient's phone number: Yes  Any changes to demographics: No   Confirmed patient's insurance: Yes  Any changes to patient's insurance: No   Discussed confidentiality: At previous visit  I connected with Breanna [name]  by a video enabled telemedicine application and verified that I am speaking with the correct person using two identifiers.     I discussed the limitations of evaluation and management by telemedicine and the availability of in person appointments.  I discussed that the purpose of this visit is to provide behavioral health care while limiting exposure to the novel coronavirus.   Discussed there is a possibility of technology failure and discussed alternative modes of communication if that failure occurs.  I discussed that engaging in this video visit, they consent to the provision of behavioral healthcare and the services will be billed under their insurance.  Patient and/or legal guardian expressed understanding and consented to video visit: Yes   PRESENTING CONCERNS: Patient and/or family reports the following symptoms/concerns: Pt states her primary concern today is ongoing symptoms of depression, anxiety and stress; pt feels slightly better as nausea has been minimal, has difficulty doing breathing exercises as she feels she isn't doing it "perfect", and still prefers self-coping strategies and no medication.  Duration of problem: Current pregnancy; Severity of problem: severe  STRENGTHS (Protective  Factors/Coping Skills): FOB supportive   GOALS ADDRESSED: Patient will: 1.  Reduce symptoms of: anxiety and depression  2.  Increase knowledge and/or ability of: self-management skills  3.  Demonstrate ability to: Increase healthy adjustment to current life circumstances and Increase motivation to adhere to plan of care  INTERVENTIONS: Interventions utilized:  Mindfulness or Relaxation Training and Brief CBT Standardized Assessments completed: GAD-7 and PHQ 9  ASSESSMENT: Patient currently experiencing Major depressive disorder, recurrent, severe, without psychotic features.   Patient may benefit from continued brief therapeutic interventions regarding coping with symptoms of anxiety and depression.  PLAN: 1. Follow up with behavioral health clinician on : Two weeks 2. Behavioral recommendations:  -Begin implementing Worry Time strategy, starting today, to prioritize areas of control vs no control  -Continue using breathing exercise daily, without focusing on specific numbers, but focus on how body is feeling instead -Continue taking prenatal vitamin daily 3. Referral(s): [place] (In Clinic)  I discussed the assessment and treatment plan with the patient and/or parent/guardian. They were provided an opportunity to ask questions and all were answered. They agreed with the plan and demonstrated an understanding of the instructions.   They were advised to call back or seek an in-person evaluation if the symptoms worsen or if the condition fails to improve as anticipated.  Breanna [name]  Depression screen [facility] 2/9 01/19/2019 01/05/2019 12/29/2018  Decreased Interest 3 3 3   Down, Depressed, Hopeless 1 1 2   PHQ - 2 Score 4 4 5   Altered sleeping 1 3 3   Tired, decreased energy 3 3 3   Change in appetite 3 3 3   Feeling bad or failure about yourself  1 3 1   Trouble concentrating 3 3 3   Moving slowly or  fidgety/restless 3 3 0  Suicidal thoughts 0 0 0   PHQ-9 Score 18 22 18    GAD 7 : Generalized Anxiety Score 01/19/2019 01/05/2019 12/29/2018  Nervous, Anxious, on Edge 3 3 3   Control/stop worrying 3 3 3   Worry too much - different things 3 3 3   Trouble relaxing 3 3 3   Restless 3 3 0  Easily annoyed or irritable 3 3 3   Afraid - awful might happen 3 3 3   Total GAD 7 Score 21 21 18

## 2019-01-19 ENCOUNTER — Ambulatory Visit (INDEPENDENT_AMBULATORY_CARE_PROVIDER_SITE_OTHER): Payer: Self-pay | Admitting: Clinical

## 2019-01-19 ENCOUNTER — Other Ambulatory Visit: Payer: Self-pay

## 2019-01-19 DIAGNOSIS — F332 Major depressive disorder, recurrent severe without psychotic features: Secondary | ICD-10-CM

## 2019-01-26 NOTE — BH Specialist Note (Deleted)
Integrated Behavioral Health via Telemedicine Video Visit  01/26/2019 Breanna [name] [id number]  Number of Integrated Behavioral Health visits: 3 Session Start time: 8:15***  Session End time: 8:45*** Total time: {IBH Total IYME:15830940}  Referring Provider: Maye Hides, CNM Type of Visit: Video*** Patient/Family location: Home*** [facility] Provider location: [facility] All persons participating in visit: Patient *** and Breanna [name] ***   Confirmed patient's address: Yes  Confirmed patient's phone number: Yes  Any changes to demographics: No   Confirmed patient's insurance: Yes  Any changes to patient's insurance: No   Discussed confidentiality: At previous visit  I connected with Breanna [name]  by a video enabled telemedicine application and verified that I am speaking with the correct person using two identifiers.     I discussed the limitations of evaluation and management by telemedicine and the availability of in person appointments.  I discussed that the purpose of this visit is to provide behavioral health care while limiting exposure to the novel coronavirus.   Discussed there is a possibility of technology failure and discussed alternative modes of communication if that failure occurs.  I discussed that engaging in this video visit, they consent to the provision of behavioral healthcare and the services will be billed under their insurance.  Patient and/or legal guardian expressed understanding and consented to video visit: Yes   PRESENTING CONCERNS: Patient and/or family reports the following symptoms/concerns: *** Duration of problem: ***; Severity of problem: {Mild/Moderate/Severe:20260}  STRENGTHS (Protective Factors/Coping Skills): ***  GOALS ADDRESSED: Patient will: 1.  Reduce symptoms of: {IBH Symptoms:21014056}  2.  Increase knowledge and/or ability of: {IBH Patient Tools:21014057}  3.  Demonstrate ability to: {IBH  Goals:21014053}  INTERVENTIONS: Interventions utilized:  {IBH Interventions:21014054} Standardized Assessments completed: {IBH Screening Tools:21014051}  ASSESSMENT: Patient currently experiencing Major depressive disorder ***, ***.   Patient may benefit from continued psychoeducation and brief therapeutic interventions regarding coping with symptoms of *** .  PLAN: 1. Follow up with behavioral health clinician on : *** 2. Behavioral recommendations:  -*** -***(worry time, calm modified, prenatals)*** 3. Referral(s): {IBH Referrals:21014055}  I discussed the assessment and treatment plan with the patient and/or parent/guardian. They were provided an opportunity to ask questions and all were answered. They agreed with the plan and demonstrated an understanding of the instructions.   They were advised to call back or seek an in-person evaluation if the symptoms worsen or if the condition fails to improve as anticipated.  Breanna [name]

## 2019-02-02 ENCOUNTER — Ambulatory Visit: Payer: Medicaid Other

## 2019-02-02 NOTE — BH Specialist Note (Signed)
Integrated Behavioral Health via Telemedicine Video Visit  02/02/2019 Breanna [name] [id number]  Number of Integrated Behavioral Health visits: 3 Session Start time: 8:15  Session End time: 8:33 Total time: 18  Referring Provider: Maye [name], CNM Type of Visit: Video Patient/Family location: Home [facility] Provider location: [facility] All persons participating in visit: Patient Breanna [name] and Breanna [name]    Confirmed patient's address: Yes  Confirmed patient's phone number: Yes  Any changes to demographics: No   Confirmed patient's insurance: Yes  Any changes to patient's insurance: No   Discussed confidentiality: At previous visit  I connected with Breanna [name]  by a video enabled telemedicine application and verified that I am speaking with the correct person using two identifiers.     I discussed the limitations of evaluation and management by telemedicine and the availability of in person appointments.  I discussed that the purpose of this visit is to provide behavioral health care while limiting exposure to the novel coronavirus.   Discussed there is a possibility of technology failure and discussed alternative modes of communication if that failure occurs.  I discussed that engaging in this video visit, they consent to the provision of behavioral healthcare and the services will be billed under their insurance.  Patient and/or legal guardian expressed understanding and consented to video visit: Yes   PRESENTING CONCERNS: Patient and/or family reports the following symptoms/concerns: Pt states her primary concern today is feeling stress over being uninsured during pregnancy; applied for pregnancy Medicaid, but has not heard from [place], and continues to receive medical bills. Pt is no longer feeling nausea, and sleep is improving.  Duration of problem: Current pregnancy; Severity of problem: severe  STRENGTHS (Protective Factors/Coping Skills): FOB  supportive  GOALS ADDRESSED: Patient will: 1.  Reduce symptoms of: anxiety and depression  2.  Demonstrate ability to: Increase healthy adjustment to current life circumstances  INTERVENTIONS: Interventions utilized:  Solution-Focused Strategies Standardized Assessments completed: GAD-7 and PHQ 9  ASSESSMENT: Patient currently experiencing Major depressive disorder, recurrent, severe, without psychotic features.   Patient may benefit from continued psychoeducation and brief therapeutic interventions regarding coping with symptoms of depression and anxiety .  PLAN: 1. Follow up with behavioral health clinician on : Two weeks 2. Behavioral recommendations:  -Call DSS today at [phone], after charging phone, to get on phone queue to speak to a case worker about Medicaid application -Use self-coping strategies daily (breathing exercise, worry time strategy) -Consider medication to treat depression/anxiety if symptoms do not improve; contact [place] at [phone] if opting for medication prior to upcoming medical appointment -Continue to take prenatal vitamin daily 3. Referral(s): [place] (In Clinic)  I discussed the assessment and treatment plan with the patient and/or parent/guardian. They were provided an opportunity to ask questions and all were answered. They agreed with the plan and demonstrated an understanding of the instructions.   They were advised to call back or seek an in-person evaluation if the symptoms worsen or if the condition fails to improve as anticipated.  Breanna [name] Breanna [name]  Depression screen [facility] 2/9 02/16/2019 01/19/2019 01/05/2019 12/29/2018  Decreased Interest 2 3 3 3   Down, Depressed, Hopeless 2 1 1 2   PHQ - 2 Score 4 4 4 5   Altered sleeping 0 1 3 3   Tired, decreased energy 3 3 3 3   Change in appetite 0 3 3 3   Feeling bad or failure about yourself  1 1 3 1   Trouble concentrating 2 3 3  3  Moving slowly or  fidgety/restless 3 3 3  0  Suicidal thoughts 0 0 0 0  PHQ-9 Score 13 18 22 18   \ GAD 7 : Generalized Anxiety Score 02/16/2019 01/19/2019 01/05/2019 12/29/2018  Nervous, Anxious, on Edge 3 3 3 3   Control/stop worrying 3 3 3 3   Worry too much - different things 3 3 3 3   Trouble relaxing 3 3 3 3   Restless 2 3 3  0  Easily annoyed or irritable 3 3 3 3   Afraid - awful might happen 3 3 3 3   Total GAD 7 Score 20 21 21  18

## 2019-02-16 ENCOUNTER — Other Ambulatory Visit: Payer: Self-pay

## 2019-02-16 ENCOUNTER — Ambulatory Visit (INDEPENDENT_AMBULATORY_CARE_PROVIDER_SITE_OTHER): Payer: Self-pay | Admitting: Clinical

## 2019-02-16 DIAGNOSIS — F332 Major depressive disorder, recurrent severe without psychotic features: Secondary | ICD-10-CM

## 2019-02-17 NOTE — L&D Delivery Note (Addendum)
OB/GYN Faculty Practice Delivery Note  Breanna [name] is a 34 y.o. [id number] s/p SVD at [redacted]w[redacted]d. She was admitted for spontaneous labor.   ROM: 1h 72m with clear fluid GBS Status:  Positive/-- (05/10 1508) with inadequate ppx with Amp  Maximum Maternal Temperature:  98.3 F  Labor Progress:  Patient arrived at 5 cm dilation and progressed spontaneously to fully dilated.   Delivery Date/Time: 07/19/2019 at 0620  Delivery: Called to room and patient was complete and pushing. Head delivered in LOA position. Nuchal cord present x1. Shoulder and body delivered in usual fashion. Infant with cry after stimulation, placed on mother's abdomen, dried and stimulated. Cord clamped x 2 after 1-minute delay, and cut by FOB. Cord blood drawn. Placenta delivered spontaneously with gentle cord traction. Fundus firm with massage and Pitocin. Labia, perineum, vagina, and cervix inspected with bilateral periurethral lacerations, right side was repaired.   Placenta: spontaneous, intact Complications: none Lacerations: bilateral periurethral, right side repaired with 4-0 Vicryl EBL: 100 cc Analgesia: epidural    Infant: APGAR (1 MIN): 8   APGAR (5 MINS): 9    Weight: 3155 grams  [name], MD Family Medicine PGY-1   OB FELLOW ATTESTATION  I was present, gloved, and supervising throughout delivery and have edited the above note to reflect any changes or updates.  Zack Seal, MD/[facility] OB Fellow  07/19/2019, 8:25 AM

## 2019-02-22 ENCOUNTER — Ambulatory Visit (HOSPITAL_COMMUNITY)
Admission: RE | Admit: 2019-02-22 | Discharge: 2019-02-22 | Disposition: A | Discharge status: Home / Self Care | Payer: Medicaid Other | Source: Ambulatory Visit | Attending: Obstetrics and Gynecology | Admitting: Obstetrics and Gynecology

## 2019-02-22 ENCOUNTER — Other Ambulatory Visit: Payer: Self-pay | Admitting: Student

## 2019-02-22 ENCOUNTER — Other Ambulatory Visit: Payer: Self-pay

## 2019-02-22 ENCOUNTER — Ambulatory Visit (HOSPITAL_COMMUNITY): Payer: Medicaid Other

## 2019-02-22 VITALS — Wt 133.5 lb

## 2019-02-22 DIAGNOSIS — F4310 Post-traumatic stress disorder, unspecified: Secondary | ICD-10-CM | POA: Diagnosis present

## 2019-02-22 DIAGNOSIS — Z1379 Encounter for other screening for genetic and chromosomal anomalies: Secondary | ICD-10-CM

## 2019-02-22 DIAGNOSIS — F419 Anxiety disorder, unspecified: Secondary | ICD-10-CM

## 2019-02-22 DIAGNOSIS — O0990 Supervision of high risk pregnancy, unspecified, unspecified trimester: Secondary | ICD-10-CM

## 2019-02-22 DIAGNOSIS — Z3A19 19 weeks gestation of pregnancy: Secondary | ICD-10-CM

## 2019-02-22 DIAGNOSIS — O3680X0 Pregnancy with inconclusive fetal viability, not applicable or unspecified: Secondary | ICD-10-CM | POA: Insufficient documentation

## 2019-02-22 DIAGNOSIS — O358XX0 Maternal care for other (suspected) fetal abnormality and damage, not applicable or unspecified: Secondary | ICD-10-CM | POA: Diagnosis not present

## 2019-02-22 DIAGNOSIS — R109 Unspecified abdominal pain: Secondary | ICD-10-CM | POA: Insufficient documentation

## 2019-02-28 ENCOUNTER — Telehealth (HOSPITAL_COMMUNITY): Payer: Self-pay | Admitting: Genetic Counselor

## 2019-02-28 LAB — MATERNIT21 PLUS CORE+SCA
Fetal Fraction: 8
Monosomy X (Turner Syndrome): NOT DETECTED
Result (T21): NEGATIVE
Trisomy 13 (Patau syndrome): NEGATIVE
Trisomy 18 (Edwards syndrome): NEGATIVE
Trisomy 21 (Down syndrome): NEGATIVE
XXX (Triple X Syndrome): NOT DETECTED
XXY (Klinefelter Syndrome): NOT DETECTED
XYY (Jacobs Syndrome): NOT DETECTED

## 2019-02-28 NOTE — Telephone Encounter (Signed)
I called Breanna [name] to discuss her negative noninvasive prenatal screening (NIPS)/cell free DNA (cfDNA) testing result. Specifically, Breanna [name] had MaterniT21 testing through [employer]. Testing was offered because of an echogenic intracardiac focus (EIF) identified on anatomy ultrasound. These negative results demonstrated an expected representation of chromosome 21, 18, 13, and sex chromosome material, greatly reducing the likelihood of trisomies 2, 34, or 71 and sex chromosome aneuploidies for the pregnancy. Breanna [name] was counseled that in the context of these negative aneuploidy screening results, the EIF was likely a benign, isolated finding.  NIPS analyzes placental (fetal) DNA in maternal circulation. NIPS is considered to be highly specific and sensitive, but is not considered to be a diagnostic test. We reviewed that this testing identifies 91-99% of pregnancies with trisomies 33, 88, and 71, and sex chromosome aneuploidies. Diagnostic testing via amniocentesis is available should she be interested in confirming this result. Breanna [name] was relieved to hear of these results and confirmed that she had no questions about them at this time.  Gershon Crane, MS Genetic Counselor

## 2019-03-01 ENCOUNTER — Telehealth (INDEPENDENT_AMBULATORY_CARE_PROVIDER_SITE_OTHER): Payer: Medicaid Other | Admitting: Medical

## 2019-03-01 ENCOUNTER — Encounter: Payer: Self-pay | Admitting: Medical

## 2019-03-01 VITALS — BP 107/65

## 2019-03-01 DIAGNOSIS — Z3A20 20 weeks gestation of pregnancy: Secondary | ICD-10-CM

## 2019-03-01 DIAGNOSIS — O0992 Supervision of high risk pregnancy, unspecified, second trimester: Secondary | ICD-10-CM

## 2019-03-01 DIAGNOSIS — O0990 Supervision of high risk pregnancy, unspecified, unspecified trimester: Secondary | ICD-10-CM

## 2019-03-01 NOTE — Patient Instructions (Signed)

## 2019-03-01 NOTE — Progress Notes (Signed)
I connected with  [name] on 03/01/19 at  4:15 PM EST by telephone and verified that I am speaking with the correct person using two identifiers.   I discussed the limitations, risks, security and privacy concerns of performing an evaluation and management service by telephone and the availability of in person appointments. I also discussed with the patient that there may be a patient responsible charge related to this service. The patient expressed understanding and agreed to proceed.  Marylynn Pearson, RN 03/01/2019  4:07 PM

## 2019-03-01 NOTE — Progress Notes (Signed)
I connected with Breanna [name] on 03/01/19 at  4:15 PM EST by: MyChart and verified that I am speaking with the correct person using two identifiers.  Patient is located at home and provider is located at [facility].     The purpose of this virtual visit is to provide medical care while limiting exposure to the novel coronavirus. I discussed the limitations, risks, security and privacy concerns of performing an evaluation and management service by MyChart and the availability of in person appointments. I also discussed with the patient that there may be a patient responsible charge related to this service. By engaging in this virtual visit, you consent to the provision of healthcare.  Additionally, you authorize for your insurance to be billed for the services provided during this visit.  The patient expressed understanding and agreed to proceed.  The following staff members participated in the virtual visit:  Marylynn Pearson, RN    PRENATAL VISIT NOTE  Subjective:  Breanna [name] is a 34 y.o. [id number] at [redacted]w[redacted]d  for phone visit for ongoing prenatal care.  She is currently monitored for the following issues for this low-risk pregnancy and has Mental disorders of mother, antepartum; Abdominal cramping; Supervision of high risk pregnancy, antepartum; Fibromyalgia; PTSD (post-traumatic stress disorder); Anxiety; Depression; and Herpes genitalia on their problem list.  Patient reports no complaints.  Contractions: Not present. Vag. Bleeding: None.  Movement: Present. Denies leaking of fluid.   The following portions of the patient's history were reviewed and updated as appropriate: allergies, current medications, past family history, past medical history, past social history, past surgical history and problem list.   Objective:   Vitals:   03/01/19 1609  BP: 107/65   Self-Obtained  Fetal Status:     Movement: Present     Assessment and Plan:  Pregnancy: [id number] at [redacted]w[redacted]d 1. Supervision of  high risk pregnancy, antepartum - Anatomy US reviewed, EIF noted, MFM did MaterniT21 which was negative  - Still unsure about MOC, does not want hormones  - Elevated PHQ9 and GAD7 - has been seeing [facility] will schedule follow-up first available   Preterm labor symptoms and general obstetric precautions including but not limited to vaginal bleeding, contractions, leaking of fluid and fetal movement were reviewed in detail with the patient.  Return in about 4 weeks (around 03/29/2019) for LOB, Virtual.  No future appointments.   Time spent on virtual visit: 10 minutes  Vonzella Nipple, PA-C

## 2019-03-06 NOTE — BH Specialist Note (Signed)
Integrated Behavioral Health via Telemedicine Video Visit  03/06/2019 YESSIKA OTTE [id number]  Number of Integrated Behavioral Health visits: 4 Session Start time: 9:48 Session End time: 10:07 Total time: 19  Referring Provider: Maye Hides, CNM Type of Visit: Video Patient/Family location: Home [facility] Provider location: [facility] All persons participating in visit: Patient Brynlea Spindler and Kuna    Confirmed patient's address: Yes  Confirmed patient's phone number: Yes  Any changes to demographics: No   Confirmed patient's insurance: Yes  Any changes to patient's insurance: No   Discussed confidentiality: At previous visit  I connected with [name]  by a video enabled telemedicine application and verified that I am speaking with the correct person using two identifiers.     I discussed the limitations of evaluation and management by telemedicine and the availability of in person appointments.  I discussed that the purpose of this visit is to provide behavioral health care while limiting exposure to the novel coronavirus.   Discussed there is a possibility of technology failure and discussed alternative modes of communication if that failure occurs.  I discussed that engaging in this video visit, they consent to the provision of behavioral healthcare and the services will be billed under their insurance.  Patient and/or legal guardian expressed understanding and consented to video visit: Yes   PRESENTING CONCERNS: Patient and/or family reports the following symptoms/concerns: Pt states self-coping strategies aren't helping, does not want to take medication; having time to herself helps to cope the most.  Duration of problem: Current pregnancy; Severity of problem: severe  STRENGTHS (Protective Factors/Coping Skills): Supportive FOB   GOALS ADDRESSED: Patient will: 1.  Reduce symptoms of: anxiety and depression  2.  Demonstrate ability to:  Increase healthy adjustment to current life circumstances  INTERVENTIONS: Interventions utilized:  Motivational Interviewing and Psychoeducation and/or Health Education Standardized Assessments completed: GAD-7 and PHQ 9  ASSESSMENT: Patient currently experiencing Major depressive disorder, recurrent, severe, without psychotic features.   Patient may benefit from continued psychoeducation and brief therapeutic interventions regarding coping with symptoms of depression and anxiety .  PLAN: 1. Follow up with behavioral health clinician on : Two weeks 2. Behavioral recommendations:  -Continue taking prenatal vitamin daily -If prenatal vitamin does not contain iron, incorporate high iron foods into daily diet -Consider scheduling 1-2 weekend days per month as self-care days until end of pregnancy (Talk to mother about keeping children those days)  3. Referral(s): [place] (In Clinic)  I discussed the assessment and treatment plan with the patient and/or parent/guardian. They were provided an opportunity to ask questions and all were answered. They agreed with the plan and demonstrated an understanding of the instructions.   They were advised to call back or seek an in-person evaluation if the symptoms worsen or if the condition fails to improve as anticipated.  Caroleen Hamman [facility]  Depression screen [facility] 2/9 03/09/2019 03/01/2019 02/16/2019 01/19/2019 01/05/2019  Decreased Interest 2 2 2 3 3   Down, Depressed, Hopeless 2 2 2 1 1   PHQ - 2 Score 4 4 4 4 4   Altered sleeping 3 3 0 1 3  Tired, decreased energy 3 3 3 3 3   Change in appetite 0 0 0 3 3  Feeling bad or failure about yourself  1 0 1 1 3   Trouble concentrating 3 3 2 3 3   Moving slowly or fidgety/restless 3 3 3 3 3   Suicidal thoughts 0 0 0 0 0  PHQ-9 Score 17 16  13 18 22    GAD 7 : Generalized Anxiety Score 03/09/2019 03/01/2019 02/16/2019 01/19/2019  Nervous, Anxious, on Edge 3 3 3 3   Control/stop worrying  3 3 3 3   Worry too much - different things 3 3 3 3   Trouble relaxing 3 3 3 3   Restless 3 3 2 3   Easily annoyed or irritable 3 3 3 3   Afraid - awful might happen 3 2 3 3   Total GAD 7 Score 21 20 20  21

## 2019-03-09 ENCOUNTER — Ambulatory Visit (INDEPENDENT_AMBULATORY_CARE_PROVIDER_SITE_OTHER): Payer: Self-pay | Admitting: Clinical

## 2019-03-09 ENCOUNTER — Other Ambulatory Visit: Payer: Self-pay

## 2019-03-09 DIAGNOSIS — F332 Major depressive disorder, recurrent severe without psychotic features: Secondary | ICD-10-CM

## 2019-03-21 NOTE — BH Specialist Note (Deleted)
Integrated Behavioral Health via Telemedicine Video Visit  03/21/2019 Breanna [name] [id number]  Number of Integrated Behavioral Health visits: 5 Session Start time: 10:15***  Session End time: 10:45*** Total time: {IBH Total QVZD:63875643}  Referring Provider: Luna [name], CNM Type of Visit: Video Patient/Family location: Home [facility] Provider location: [facility] All persons participating in visit: Patient *** and [facility] Breanna [name] ***   Confirmed patient's address: Yes  Confirmed patient's phone number: Yes  Any changes to demographics: No   Confirmed patient's insurance: Yes  Any changes to patient's insurance: No   Discussed confidentiality: at previous visit   by a video enabled telemedicine application and verified that I am speaking with the correct person using two identifiers.     I discussed the limitations of evaluation and management by telemedicine and the availability of in person appointments.  I discussed that the purpose of this visit is to provide behavioral health care while limiting exposure to the novel coronavirus.   Discussed there is a possibility of technology failure and discussed alternative modes of communication if that failure occurs.  I discussed that engaging in this video visit, they consent to the provision of behavioral healthcare and the services will be billed under their insurance.  Patient and/or legal guardian expressed understanding and consented to video visit: Yes   PRESENTING CONCERNS: Patient and/or family reports the following symptoms/concerns: *** Duration of problem: Current pregnancy; Severity of problem: {Mild/Moderate/Severe:20260}  STRENGTHS (Protective Factors/Coping Skills): Supportive FOB***  GOALS ADDRESSED: Patient will: 1.  Reduce symptoms of: {IBH Symptoms:21014056}  2.  Increase knowledge and/or ability of: {IBH Patient Tools:21014057}  3.  Demonstrate ability to: {IBH  Goals:21014053}  INTERVENTIONS: Interventions utilized:  {IBH Interventions:21014054} Standardized Assessments completed: {IBH Screening Tools:21014051}  ASSESSMENT: Patient currently experiencing Major depressive disorder, recurrent, severe, without psychotic features.   Patient may benefit from continued psychoeducation and brief therapeutic interventions regarding coping with symptoms of *** .  PLAN: 1. Follow up with behavioral health clinician on : *** 2. Behavioral recommendations:  -*** -***(prenatal or iron foods, schedule days for self-care w mom keeping kids)*** 3. Referral(s): {IBH Referrals:21014055}  I discussed the assessment and treatment plan with the patient and/or parent/guardian. They were provided an opportunity to ask questions and all were answered. They agreed with the plan and demonstrated an understanding of the instructions.   They were advised to call back or seek an in-person evaluation if the symptoms worsen or if the condition fails to improve as anticipated.  Breanna [name] Breanna [name]

## 2019-03-23 ENCOUNTER — Ambulatory Visit: Payer: Medicaid Other

## 2019-03-29 ENCOUNTER — Other Ambulatory Visit: Payer: Self-pay

## 2019-03-29 ENCOUNTER — Telehealth (INDEPENDENT_AMBULATORY_CARE_PROVIDER_SITE_OTHER): Payer: Medicaid Other | Admitting: Nurse Practitioner

## 2019-03-29 ENCOUNTER — Encounter: Payer: Self-pay | Admitting: Nurse Practitioner

## 2019-03-29 DIAGNOSIS — Z3A24 24 weeks gestation of pregnancy: Secondary | ICD-10-CM

## 2019-03-29 DIAGNOSIS — O0992 Supervision of high risk pregnancy, unspecified, second trimester: Secondary | ICD-10-CM | POA: Diagnosis not present

## 2019-03-29 DIAGNOSIS — O0990 Supervision of high risk pregnancy, unspecified, unspecified trimester: Secondary | ICD-10-CM

## 2019-03-29 NOTE — Progress Notes (Signed)
Pt does not have access to her Cuff right now, asked if she could take BP when she gets home & log into BR[facility], pt. Verbalized understanding.

## 2019-03-29 NOTE — Progress Notes (Signed)
  I connected with  Breanna [name] on 03/29/19 at  9:15 AM EST by telephone and verified that I am speaking with the correct person using two identifiers.   I discussed the limitations, risks, security and privacy concerns of performing an evaluation and management service by telephone and the availability of in person appointments. I also discussed with the patient that there may be a patient responsible charge related to this service. The patient expressed understanding and agreed to proceed.  Henrietta Dine, CMA 03/29/2019  9:33 AM

## 2019-03-29 NOTE — Progress Notes (Signed)
I connected with@ on 03/29/19 at  9:15 AM EST by: Mychart and verified that I am speaking with the correct person using two identifiers.  Patient is located at work and provider is located at [facility].     The purpose of this virtual visit is to provide medical care while limiting exposure to the novel coronavirus. I discussed the limitations, risks, security and privacy concerns of performing an evaluation and management service by MyChart and the availability of in person appointments. I also discussed with the patient that there may be a patient responsible charge related to this service. By engaging in this virtual visit, you consent to the provision of healthcare.  Additionally, you authorize for your insurance to be billed for the services provided during this visit.  The patient expressed understanding and agreed to proceed.  The following staff members participated in the virtual visit:  Marykay Lex, CMA and Nolene Bernheim, NP    PRENATAL VISIT NOTE  Subjective:  Breanna [name] is a 34 y.o. [id number] at [redacted]w[redacted]d  for phone visit for ongoing prenatal care.  She is currently monitored for the following issues for this low-risk pregnancy and has Mental disorders of mother, antepartum; Abdominal cramping; Supervision of high risk pregnancy, antepartum; Fibromyalgia; PTSD (post-traumatic stress disorder); Anxiety; Depression; and Herpes genitalia on their problem list.  Patient reports no complaints.  Contractions: Not present. Vag. Bleeding: None.  Movement: Present. Denies leaking of fluid.   The following portions of the patient's history were reviewed and updated as appropriate: allergies, current medications, past family history, past medical history, past social history, past surgical history and problem list.   Objective:  There were no vitals filed for this visit. Self-Obtained  Fetal Status:     Movement: Present     Assessment and Plan:  Pregnancy: [id number] at [redacted]w[redacted]d 1.  Supervision  of high risk pregnancy Client has been depressed previously.  Does not want to start medication.  Plans to follow up with [facility] provider in the office after the baby is born. Advised of next appointment to be fasting since midnight for early AM 2 hr GTT.   Reviewed Baby scripts data in chart - good but reminded client to check BP weekly and record.  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  Preterm labor symptoms and general obstetric precautions including but not limited to vaginal bleeding, contractions, leaking of fluid and fetal movement were reviewed in detail with the patient.  Return in about 4 weeks (around 04/26/2019) for in person ROB - early AM for fasting GTT.  Future Appointments  Date Time Provider Department Center  04/28/2019  8:15 AM Currie Paris, NP [facility]  04/28/2019  9:30 AM [facility]     Time spent on virtual visit: 5  minutes  Currie Paris, NP

## 2019-04-21 ENCOUNTER — Telehealth: Payer: Self-pay | Admitting: Clinical

## 2019-04-26 ENCOUNTER — Other Ambulatory Visit: Payer: Self-pay

## 2019-04-26 DIAGNOSIS — O0990 Supervision of high risk pregnancy, unspecified, unspecified trimester: Secondary | ICD-10-CM

## 2019-04-28 ENCOUNTER — Encounter: Payer: Medicaid Other | Admitting: Nurse Practitioner

## 2019-04-28 ENCOUNTER — Other Ambulatory Visit: Payer: Medicaid Other

## 2019-05-04 ENCOUNTER — Other Ambulatory Visit: Payer: Self-pay

## 2019-05-04 ENCOUNTER — Ambulatory Visit (INDEPENDENT_AMBULATORY_CARE_PROVIDER_SITE_OTHER): Payer: Medicaid Other | Admitting: Certified Nurse Midwife

## 2019-05-04 VITALS — BP 106/67 | HR 84 | Wt 146.1 lb

## 2019-05-04 DIAGNOSIS — B001 Herpesviral vesicular dermatitis: Secondary | ICD-10-CM

## 2019-05-04 DIAGNOSIS — O0990 Supervision of high risk pregnancy, unspecified, unspecified trimester: Secondary | ICD-10-CM

## 2019-05-04 DIAGNOSIS — Z8619 Personal history of other infectious and parasitic diseases: Secondary | ICD-10-CM

## 2019-05-04 MED ORDER — ACYCLOVIR 5 % EX OINT: 1.0000 "application " | TOPICAL_OINTMENT | CUTANEOUS | 0 refills | Status: DC | PRN

## 2019-05-04 MED ORDER — COMFORT FIT MATERNITY SUPP MED MISC: 1.0000 | Freq: Every day | 0 refills | Status: DC

## 2019-05-04 NOTE — Progress Notes (Signed)
Subjective:  Breanna [name] is a 34 y.o. [id number] at [redacted]w[redacted]d being seen today for ongoing prenatal care.  She is currently monitored for the following issues for this low-risk pregnancy and has Mental disorders of mother, antepartum; Abdominal cramping; Supervision of high risk pregnancy, antepartum; Fibromyalgia; PTSD (post-traumatic stress disorder); Anxiety; Depression; Herpes genitalia; and History of herpes genitalis on their problem list.  Patient reports having more cold sores recently, requesting Zovirax topical. C/o pelvic pressure while standing. Works as [employer], on Health visitor a lot. Contractions: Irritability. Vag. Bleeding: None.  Movement: Present. Denies leaking of fluid.   The following portions of the patient's history were reviewed and updated as appropriate: allergies, current medications, past family history, past medical history, past social history, past surgical history and problem list. Problem list updated.  Objective:   Vitals:   05/04/19 1130  BP: 106/67  Pulse: 84  Weight: 146 lb 1.6 oz (66.3 kg)    Fetal Status: Fetal Heart Rate (bpm): 134 Fundal Height: 30 cm Movement: Present  Presentation: Undeterminable  General:  Alert, oriented and cooperative. Patient is in no acute distress.  Skin: Skin is warm and dry. No rash noted.   Cardiovascular: Normal heart rate noted  Respiratory: Normal respiratory effort, no problems with respiration noted  Abdomen: Soft, gravid, appropriate for gestational age. Pain/Pressure: Present     Pelvic: Vag. Bleeding: None     Cervical exam deferred        Extremities: Normal range of motion.  Edema: None  Mental Status: Normal mood and affect. Normal behavior. Normal judgment and thought content.   Urinalysis:      Assessment and Plan:  Pregnancy: [id number] at [redacted]w[redacted]d  1. Supervision of high risk pregnancy, antepartum - Obstetric Panel, Including HIV - Hemoglobin A1c - Hepatitis C Antibody - Hemoglobinopathy Evaluation - Inheritest(R)  CF/SMA Panel - declined GTT, agrees to home glucose monitoring x2 weeks, instructed to check FBS and 2 hrs pp after largest meal, bring log to next visit  2. History of herpes genitalis - start suppression at 35 wks  3. Cold sore - acyclovir ointment (ZOVIRAX) 5 %; Apply 1 application topically as needed.  Dispense: 5 g; Refill: 0  Preterm labor symptoms and general obstetric precautions including but not limited to vaginal bleeding, contractions, leaking of fluid and fetal movement were reviewed in detail with the patient. Please refer to After Visit Summary for other counseling recommendations.  Return in about 2 weeks (around 05/18/2019).   Donette Larry, CNM

## 2019-05-04 NOTE — Patient Instructions (Addendum)
 PREGNANCY SUPPORT BELT: You are not alone, Seventy-five percent of women have some sort of abdominal or back pain at some point in their pregnancy. Your baby is growing at a fast pace, which means that your whole body is rapidly trying to adjust to the changes. As your uterus grows, your back may start feeling a bit under stress and this can result in back or abdominal pain that can go from mild, and therefore bearable, to severe pains that will not allow you to sit or lay down comfortably, When it comes to dealing with pregnancy-related pains and cramps, some pregnant women usually prefer natural remedies, which the market is filled with nowadays. For example, wearing a pregnancy support belt can help ease and lessen your discomfort and pain. WHAT ARE THE BENEFITS OF WEARING A PREGNANCY SUPPORT BELT? A pregnancy support belt provides support to the lower portion of the belly taking some of the weight of the growing uterus and distributing to the other parts of your body. It is designed make you comfortable and gives you extra support. Over the years, the pregnancy apparel market has been studying the needs and wants of pregnant women and they have come up with the most comfortable pregnancy support belts that woman could ever ask for. In fact, you will no longer have to wear a stretched-out or bulky pregnancy belt that is visible underneath your clothes and makes you feel even more uncomfortable. Nowadays, a pregnancy support belt is made of comfortable and stretchy materials that will not irritate your skin but will actually make you feel at ease and you will not even notice you are wearing it. They are easy to put on and adjust during the day and can be worn at night for additional support.  BENEFITS: . Relives Back pain . Relieves Abdominal Muscle and Leg Pain . Stabilizes the Pelvic Ring . Offers a Cushioned Abdominal Lift Pad . Relieves pressure on the Sciatic Nerve Within Minutes WHERE TO GET  YOUR PREGNANCY BELT: [employer] [facility] [employer] [phone] @[address] [place], [place] [address]    Third Trimester of Pregnancy  The third trimester is from week 28 through week 40 (months 7 through 9). This trimester is when your unborn baby (fetus) is growing very fast. At the end of the ninth month, the unborn baby is about 20 inches in length. It weighs about 6-10 pounds. Follow these instructions at home: Medicines  Take over-the-counter and prescription medicines only as told by your doctor. Some medicines are safe and some medicines are not safe during pregnancy.  Take a prenatal vitamin that contains at least 600 micrograms (mcg) of folic acid.  If you have trouble pooping (constipation), take medicine that will make your stool soft (stool softener) if your doctor approves. Eating and drinking   Eat regular, healthy meals.  Avoid raw meat and uncooked cheese.  If you get low calcium from the food you eat, talk to your doctor about taking a daily calcium supplement.  Eat four or five small meals rather than three large meals a day.  Avoid foods that are high in fat and sugars, such as fried and sweet foods.  To prevent constipation: ? Eat foods that are high in fiber, like fresh fruits and vegetables, whole grains, and beans. ? Drink enough fluids to keep your pee (urine) clear or pale yellow. Activity  Exercise only as told by your doctor. Stop exercising if you start to have cramps.  Avoid heavy lifting,   wear low heels, and sit up straight.  Do not exercise if it is too hot, too humid, or if you are in a place of great height (high altitude).  You may continue to have sex unless your doctor tells you not to. Relieving pain and discomfort  Wear a good support bra if your breasts are tender.  Take frequent breaks and rest with your legs raised if you have leg cramps or low back pain.  Take warm water baths (sitz baths) to soothe pain or discomfort  caused by hemorrhoids. Use hemorrhoid cream if your doctor approves.  If you develop puffy, bulging veins (varicose veins) in your legs: ? Wear support hose or compression stockings as told by your doctor. ? Raise (elevate) your feet for 15 minutes, 3-4 times a day. ? Limit salt in your food. Safety  Wear your seat belt when driving.  Make a list of emergency phone numbers, including numbers for family, friends, the hospital, and police and fire departments. Preparing for your baby's arrival To prepare for the arrival of your baby:  Take prenatal classes.  Practice driving to the hospital.  Visit the hospital and tour the maternity area.  Talk to your work about taking leave once the baby comes.  Pack your hospital bag.  Prepare the baby's room.  Go to your doctor visits.  Buy a rear-facing car seat. Learn how to install it in your car. General instructions  Do not use hot tubs, steam rooms, or saunas.  Do not use any products that contain nicotine or tobacco, such as cigarettes and e-cigarettes. If you need help quitting, ask your doctor.  Do not drink alcohol.  Do not douche or use tampons or scented sanitary pads.  Do not cross your legs for long periods of time.  Do not travel for long distances unless you must. Only do so if your doctor says it is okay.  Visit your dentist if you have not gone during your pregnancy. Use a soft toothbrush to brush your teeth. Be gentle when you floss.  Avoid cat litter boxes and soil used by cats. These carry germs that can cause birth defects in the baby and can cause a loss of your baby (miscarriage) or stillbirth.  Keep all your prenatal visits as told by your doctor. This is important. Contact a doctor if:  You are not sure if you are in labor or if your water has broken.  You are dizzy.  You have mild cramps or pressure in your lower belly.  You have a nagging pain in your belly area.  You continue to feel sick to  your stomach, you throw up, or you have watery poop.  You have bad smelling fluid coming from your vagina.  You have pain when you pee. Get help right away if:  You have a fever.  You are leaking fluid from your vagina.  You are spotting or bleeding from your vagina.  You have severe belly cramps or pain.  You lose or gain weight quickly.  You have trouble catching your breath and have chest pain.  You notice sudden or extreme puffiness (swelling) of your face, hands, ankles, feet, or legs.  You have not felt the baby move in over an hour.  You have severe headaches that do not go away with medicine.  You have trouble seeing.  You are leaking, or you are having a gush of fluid, from your vagina before you are 37 weeks.  You have   regular belly spasms (contractions) before you are 37 weeks. Summary  The third trimester is from week 28 through week 40 (months 7 through 9). This time is when your unborn baby is growing very fast.  Follow your doctor's advice about medicine, food, and activity.  Get ready for the arrival of your baby by taking prenatal classes, getting all the baby items ready, preparing the baby's room, and visiting your doctor to be checked.  Get help right away if you are bleeding from your vagina, or you have chest pain and trouble catching your breath, or if you have not felt your baby move in over an hour. This information is not intended to replace advice given to you by your health care provider. Make sure you discuss any questions you have with your health care provider. Document Revised: 05/26/2018 Document Reviewed: 03/10/2016 Elsevier Patient Education  2020 [employer]  

## 2019-05-05 MED ORDER — ACCU-CHEK SOFTCLIX LANCETS MISC: 12 refills | Status: DC

## 2019-05-05 MED ORDER — ACCU-CHEK GUIDE VI STRP: ORAL_STRIP | 12 refills | Status: DC

## 2019-05-05 MED ORDER — ACCU-CHEK GUIDE W/DEVICE KIT: 1.0000 | PACK | Freq: Once | 0 refills | Status: AC

## 2019-05-05 NOTE — Addendum Note (Signed)
Addended by: Marjo Bicker on: 05/05/2019 08:37 AM   Modules accepted: Orders

## 2019-05-15 ENCOUNTER — Encounter: Payer: Self-pay | Admitting: Certified Nurse Midwife

## 2019-05-15 DIAGNOSIS — O99019 Anemia complicating pregnancy, unspecified trimester: Secondary | ICD-10-CM | POA: Insufficient documentation

## 2019-05-15 LAB — OBSTETRIC PANEL, INCLUDING HIV
Antibody Screen: NEGATIVE
Basophils Absolute: 0 10*3/uL (ref 0.0–0.2)
Basos: 0 %
EOS (ABSOLUTE): 0.1 10*3/uL (ref 0.0–0.4)
Eos: 1 %
HIV Screen 4th Generation wRfx: NONREACTIVE
Hematocrit: 27.3 % — ABNORMAL LOW (ref 34.0–46.6)
Hemoglobin: 8.4 g/dL — ABNORMAL LOW (ref 11.1–15.9)
Hepatitis B Surface Ag: NEGATIVE
Immature Grans (Abs): 0 10*3/uL (ref 0.0–0.1)
Immature Granulocytes: 1 %
Lymphocytes Absolute: 1.1 10*3/uL (ref 0.7–3.1)
Lymphs: 13 %
MCH: 21.4 pg — ABNORMAL LOW (ref 26.6–33.0)
MCHC: 30.8 g/dL — ABNORMAL LOW (ref 31.5–35.7)
MCV: 70 fL — ABNORMAL LOW (ref 79–97)
Monocytes Absolute: 0.7 10*3/uL (ref 0.1–0.9)
Monocytes: 8 %
Neutrophils Absolute: 6.6 10*3/uL (ref 1.4–7.0)
Neutrophils: 77 %
Platelets: 255 10*3/uL (ref 150–450)
RBC: 3.92 x10E6/uL (ref 3.77–5.28)
RDW: 17 % — ABNORMAL HIGH (ref 11.7–15.4)
RPR Ser Ql: NONREACTIVE
Rh Factor: POSITIVE
Rubella Antibodies, IGG: 12.6 index (ref >0.99)
WBC: 8.5 10*3/uL (ref 3.4–10.8)

## 2019-05-15 LAB — HEMOGLOBIN A1C
Est. average glucose Bld gHb Est-mCnc: 108 mg/dL
Hgb A1c MFr Bld: 5.4 % (ref 4.8–5.6)

## 2019-05-15 LAB — INHERITEST(R) CF/SMA PANEL

## 2019-05-15 LAB — HEPATITIS C ANTIBODY: Hep C Virus Ab: <0.1 s/co ratio (ref 0.0–0.9)

## 2019-05-15 LAB — HEMOGLOBPATHY+FER W/A THAL RFX
Ferritin: 5 ng/mL — ABNORMAL LOW (ref 15–150)
Hgb A2: 2.2 % (ref 1.8–3.2)
Hgb A: 97.8 % (ref 96.4–98.8)
Hgb F: 0 % (ref 0.0–2.0)
Hgb S: 0 %

## 2019-05-16 ENCOUNTER — Telehealth: Payer: Self-pay

## 2019-05-16 ENCOUNTER — Encounter: Payer: Self-pay | Admitting: *Deleted

## 2019-05-16 ENCOUNTER — Other Ambulatory Visit: Payer: Self-pay

## 2019-05-16 NOTE — Telephone Encounter (Signed)
-----   Message from Donette Larry, [place] sent at 05/15/2019  1:53 PM EDT ----- Needs Feraheme infusion weekly x2

## 2019-05-16 NOTE — Telephone Encounter (Signed)
Called pt and left VM stating that I am calling with test result information as well as another appointment has been scheduled for her. I will send a MyChart message with the details. If she has questions, she may send a message back or call the office.

## 2019-05-17 ENCOUNTER — Telehealth: Payer: Self-pay | Admitting: *Deleted

## 2019-05-17 NOTE — Telephone Encounter (Addendum)
Pt left VM message stating that she has been having some trouble breathing @ night and has questions about that. Pt stated that her BP is normal. She would like a nurse to call her back.   1150  Call returned to pt and discussed her concern. She reports that for the past 3 nights she has felt like she could not take in a deep breath - "Like if I could put on an oxygen mask, it would be better". She denies having this problem during the [name]. I advised this symptom can be related to several different factors. She may have some seasonal allergies and if desired, she may try Allegra, Claritin  or Zyrtec per package directions. More than likely, this is related to some anxiety or her low iron. Pt has appt for Fereheme infusion on 4/2. She states she has not been feeling anxious about anything. Pt was advised to try different positions in order to improve her breathing. She should go to [facility] if the trouble breathing becomes worse. Pt voiced understanding.

## 2019-05-19 ENCOUNTER — Encounter (HOSPITAL_COMMUNITY)
Admission: RE | Admit: 2019-05-19 | Discharge: 2019-05-19 | Disposition: A | Discharge status: Home / Self Care | Payer: Medicaid Other | Source: Ambulatory Visit | Attending: Certified Nurse Midwife | Admitting: Certified Nurse Midwife

## 2019-05-19 ENCOUNTER — Other Ambulatory Visit: Payer: Self-pay

## 2019-05-19 DIAGNOSIS — O99019 Anemia complicating pregnancy, unspecified trimester: Secondary | ICD-10-CM | POA: Insufficient documentation

## 2019-05-19 MED ORDER — SODIUM CHLORIDE 0.9 % IV SOLN
510.0000 mg | INTRAVENOUS | Status: DC
  Administered 2019-05-19: 510 mg via INTRAVENOUS
  Filled 2019-05-19: qty 17

## 2019-05-19 NOTE — Discharge Instructions (Signed)

## 2019-05-25 ENCOUNTER — Encounter: Payer: Medicaid Other | Admitting: Obstetrics and Gynecology

## 2019-05-26 ENCOUNTER — Other Ambulatory Visit: Payer: Self-pay

## 2019-05-26 ENCOUNTER — Encounter (HOSPITAL_COMMUNITY)
Admission: RE | Admit: 2019-05-26 | Discharge: 2019-05-26 | Disposition: A | Discharge status: Home / Self Care | Payer: Medicaid Other | Source: Ambulatory Visit | Attending: Certified Nurse Midwife | Admitting: Certified Nurse Midwife

## 2019-05-26 DIAGNOSIS — O99019 Anemia complicating pregnancy, unspecified trimester: Secondary | ICD-10-CM | POA: Diagnosis not present

## 2019-05-26 MED ORDER — SODIUM CHLORIDE 0.9 % IV SOLN
510.0000 mg | INTRAVENOUS | Status: DC
  Administered 2019-05-26: 510 mg via INTRAVENOUS
  Filled 2019-05-26: qty 17

## 2019-06-02 ENCOUNTER — Encounter: Payer: Medicaid Other | Admitting: Medical

## 2019-06-14 ENCOUNTER — Other Ambulatory Visit: Payer: Self-pay

## 2019-06-14 ENCOUNTER — Encounter: Payer: Self-pay | Admitting: Advanced Practice Midwife

## 2019-06-14 ENCOUNTER — Ambulatory Visit (INDEPENDENT_AMBULATORY_CARE_PROVIDER_SITE_OTHER): Payer: Medicaid Other | Admitting: Advanced Practice Midwife

## 2019-06-14 VITALS — BP 122/70 | HR 90 | Wt 153.6 lb

## 2019-06-14 DIAGNOSIS — Z3480 Encounter for supervision of other normal pregnancy, unspecified trimester: Secondary | ICD-10-CM

## 2019-06-14 DIAGNOSIS — O0990 Supervision of high risk pregnancy, unspecified, unspecified trimester: Secondary | ICD-10-CM

## 2019-06-14 LAB — CBC
Hematocrit: 33.1 % — ABNORMAL LOW (ref 34.0–46.6)
Hemoglobin: 11.1 g/dL (ref 11.1–15.9)
MCH: 25.4 pg — ABNORMAL LOW (ref 26.6–33.0)
MCHC: 33.5 g/dL (ref 31.5–35.7)
MCV: 76 fL — ABNORMAL LOW (ref 79–97)
Platelets: 228 10*3/uL (ref 150–450)
RBC: 4.37 x10E6/uL (ref 3.77–5.28)
WBC: 7 10*3/uL (ref 3.4–10.8)

## 2019-06-14 MED ORDER — FERROUS GLUCONATE 324 (38 FE) MG PO TABS: 324.0000 mg | ORAL_TABLET | ORAL | 3 refills | Status: DC

## 2019-06-14 MED ORDER — VALACYCLOVIR HCL 1 G PO TABS: 1000.0000 mg | ORAL_TABLET | Freq: Every day | ORAL | 1 refills | Status: DC

## 2019-06-14 NOTE — Progress Notes (Signed)
   PRENATAL VISIT NOTE  Subjective:  Breanna [name] is a 34 y.o. [phone] at [redacted]w[redacted]d being seen today for ongoing prenatal care.  She is currently monitored for the following issues for this low-risk pregnancy and has Mental disorders of mother, antepartum; Abdominal cramping; Supervision of high risk pregnancy, antepartum; Fibromyalgia; PTSD (post-traumatic stress disorder); Anxiety; Depression; Herpes genitalia; History of herpes genitalis; and Anemia in pregnancy on their problem list.  Patient reports no complaints.  Contractions: Irritability. Vag. Bleeding: None.  Movement: Present. Denies leaking of fluid.   The following portions of the patient's history were reviewed and updated as appropriate: allergies, current medications, past family history, past medical history, past social history, past surgical history and problem list.   Objective:   Vitals:   06/14/19 1439  BP: 122/70  Pulse: 90  Weight: 153 lb 9.6 oz (69.7 kg)    Fetal Status: Fetal Heart Rate (bpm): 154   Movement: Present     General:  Alert, oriented and cooperative. Patient is in no acute distress.  Skin: Skin is warm and dry. No rash noted.   Cardiovascular: Normal heart rate noted  Respiratory: Normal respiratory effort, no problems with respiration noted  Abdomen: Soft, gravid, appropriate for gestational age.  Pain/Pressure: Absent     Pelvic: Cervical exam deferred        Extremities: Normal range of motion.  Edema: None  Mental Status: Normal mood and affect. Normal behavior. Normal judgment and thought content.    Fasting glucose was 70-76 x 2 weeks  Postprandial were all less than 120 x 2 weeks except for one value that was 126.   Assessment and Plan:  Pregnancy: [id number] at [redacted]w[redacted]d 1. Supervision of other normal pregnancy, antepartum - depression screening high today, but improved from last visit. Patient does not want to see Asher Muir today. She does plan to see Asher Muir after she has the baby and is  planning to start medication soon after the birth.  - History of HSV, will start PPX today- rx valtrex 1g daily sent to pharmacy  -CBC today. Patient has had 2 iron infusions. She states that she felt great for about one week, but is now feeling fatigued again. Will check CBC today and rx sent for iron QOD to start now.  - GBS at next visit    Preterm labor symptoms and general obstetric precautions including but not limited to vaginal bleeding, contractions, leaking of fluid and fetal movement were reviewed in detail with the patient. Please refer to After Visit Summary for other counseling recommendations.   Return in about 1 week (around 06/21/2019) for in person visit for GBS and 36 week labs .  No future appointments.  Thressa Sheller DNP, CNM  06/14/19  2:50 PM

## 2019-06-14 NOTE — Patient Instructions (Signed)
  BRAINSTORMING  Develop a Plan Goals: . Provide a way to start conversation about your new life with a baby . Assist parents in recognizing and using resources within their reach . Help pave the way before birth for an easier period of transition afterwards.  Make a list of the following information to keep in a central location: . Full name of Mom and Partner: _____________________________________________ . Baby's full name and Date of Birth: ___________________________________________ . Home Address: ___________________________________________________________ ________________________________________________________________________ . Home Phone: ____________________________________________________________ . Parents' cell numbers: _____________________________________________________ ________________________________________________________________________ . Name and contact info for OB: ______________________________________________ . Name and contact info for Pediatrician:________________________________________ . Contact info for Lactation Consultants: ________________________________________  REST and SLEEP *You each need at least 4-5 hours of uninterrupted sleep every day. Write specific names and contact information.* . How are you going to rest in the postpartum period? While partner's home? When partner returns to work? When you both return to work? . Where will your baby sleep? . Who is available to help during the day? Evening? Night? . Who could move in for a period to help support you? . What are some ideas to help you get enough sleep? __________________________________________________________________________________________________________________________________________________________________________________________________________________________________________ NUTRITIOUS FOOD AND DRINK *Plan for meals before your baby is born so you can have healthy food to eat  during the immediate postpartum period.* . Who will look after breakfast? Lunch? Dinner? List names and contact information. Brainstorm quick, healthy ideas for each meal. . What can you do before baby is born to prepare meals for the postpartum period? . How can others help you with meals? . Which grocery stores provide online shopping and delivery? . Which restaurants offer take-out or delivery options? ______________________________________________________________________________________________________________________________________________________________________________________________________________________________________________________________________________________________________________________________________________________________________________________________________  CARE FOR MOM *It's important that mom is cared for and pampered in the postpartum period. Remember, the most important ways new mothers need care are: sleep, nutrition, gentle exercise, and time off.* . Who can come take care of mom during this period? Make a list of people with their contact information. . List some activities that make you feel cared for, rested, and energized? Who can make sure you have opportunities to do these things? . Does mom have a space of her very own within your home that's just for her? Make a "Mama Cave" where she can be comfortable, rest, and renew herself daily. ______________________________________________________________________________________________________________________________________________________________________________________________________________________________________________________________________________________________________________________________________________________________________________________________________    CARE FOR AND FEEDING BABY *Knowledgeable and encouraging people will offer the best support with regard to feeding your  baby.* . Educate yourself and choose the best feeding option for your baby. . Make a list of people who will guide, support, and be a resource for you as your care for and feed your baby. (Friends that have breastfed or are currently breastfeeding, lactation consultants, breastfeeding support groups, etc.) . Consider a postpartum doula. (These websites can give you information: dona.org & padanc.org) . Seek out local breastfeeding resources like the breastfeeding support group at [facility] or [employer]. ______________________________________________________________________________________________________________________________________________________________________________________________________________________________________________________________________________________________________________________________________________________________________________________________________  CHORES AND ERRANDS . Who can help with a thorough cleaning before baby is born? . Make a list of people who will help with housekeeping and chores, like laundry, light cleaning, dishes, bathrooms, etc. . Who can run some errands for you? . What can you do to make sure you are stocked with basic supplies before baby is born? . Who is going to do the shopping? ______________________________________________________________________________________________________________________________________________________________________________________________________________________________________________________________________________________________________________________________________________________________________________________________________     Family Adjustment *Nurture yourselves.it helps parents be more loving and allows for better bonding with their child.* .   child.* . What sorts of things do you and partner enjoy doing together? Which activities help you to connect and strengthen your relationship? Make a list of  those things. Make a list of people whom you trust to care for your baby so you can have some time together as a couple. . What types of things help partner feel connected to Mom? Make a list. . What needs will partner have in order to bond with baby? . Other children? Who will care for them when you go into labor and while you are in the hospital? . Think about what the needs of your older children might be. Who can help you meet those needs? In what ways are you helping them prepare for bringing baby home? List some specific strategies you have for family adjustment. _______________________________________________________________________________________________________________________________________________________________________________________________________________________________________________________________________________________________________________________________________________  SUPPORT *Someone who can empathize with experiences normalizes your problems and makes them more bearable.* . Make a list of other friends, neighbors, and/or co-workers you know with infants (and small children, if applicable) with whom you can connect. . Make a list of local or online support groups, mom groups, etc. in which you can be involved. ______________________________________________________________________________________________________________________________________________________________________________________________________________________________________________________________________________________________________________________________________________________________________________________________________  Childcare Plans . Investigate and plan for childcare if mom is returning to work. . Talk about mom's concerns about her transition back to work. . Talk about partner's concerns regarding this transition.  Mental Health *Your mental health is one of the highest priorities for  a pregnant or postpartum mom.* . 1 in 5 women experience anxiety and/or depression from the time of conception through the first year after birth. . Postpartum Mood Disorders are the #1 complication of pregnancy and childbirth and the suffering experienced by these mothers is not necessary! These illnesses are temporary and respond well to treatment, which often includes self-care, social support, talk therapy, and medication when needed. . Women experiencing anxiety and depression often say things like: "I'm supposed to be happy.why do I feel so sad?", "Why can't I snap out of it?", "I'm having thoughts that scare me." . There is no need to be embarrassed if you are feeling these symptoms: o Overwhelmed, anxious, angry, sad, guilty, irritable, hopeless, exhausted but can't sleep o You are NOT alone. You are NOT to blame. With help, you WILL be well. . Where can I find help? Medical professionals such as your OB, midwife, gynecologist, family practitioner, primary care provider, pediatrician, or mental health providers; [facility] support groups: Feelings After Birth, Breastfeeding Support Group, Baby and Me Group, and Fit 4 Two exercise classes. . You have permission to ask for help. It will confirm your feelings, validate your experiences, share/learn coping strategies, and gain support and encouragement as you heal. You are important! BRAINSTORM . Make a list of local resources, including resources for mom and for partner. . Identify support groups. . Identify people to call late at night - include names and contact info. . Talk with partner about perinatal mood and anxiety disorders. . Talk with your OB, midwife, and doula about baby blues and about perinatal mood and anxiety disorders. . Talk with your pediatrician about perinatal mood and anxiety disorders.   Support & Sanity Savers   What do you really need?  . Basics . In preparing for a new baby, many expectant parents spend  hours shopping for baby clothes, decorating the nursery, and deciding which car seat to buy. Yet most don't think much about what the reality of parenting a newborn will be like, and what they need  to make it through that. So, here is the advice of experienced parents. We know you'll read this, and think "they're exaggerating, I don't really need that." Just trust [place] on these, OK? Plan for all of this, and if it turns out you don't need it, come back and teach [place] how you did it!  Satira Anis (Once baby's survival needs are met, make sure you attend to your own survival needs!) . Sleep . An average newborn sleeps 16-18 hours per day, over 6-7 sleep periods, rarely more than three hours at a time. It is normal and healthy for a newborn to wake throughout the night... but really hard on parents!! . Naps. Prioritize sleep above any responsibilities like: cleaning house, visiting friends, running errands, etc.  Sleep whenever baby sleeps. If you can't nap, at least have restful times when baby eats. The more rest you get, the more patient you will be, the more emotionally stable, and better at solving problems.  . Food . You may not have realized it would be difficult to eat when you have a newborn. Yet, when we talk to . countless new parents, they say things like "it may be 2:00 pm when I realize I haven't had breakfast yet." Or "every time we sit down to dinner, baby needs to eat, and my food gets cold, so I don't bother to eat it." . Finger food. Before your baby is born, stock up with one months' worth of food that: 1) you can eat with one hand while holding a baby, 2) doesn't need to be prepped, 3) is good hot or cold, 4) doesn't spoil when left out for a few hours, and 5) you like to eat. Think about: nuts, dried fruit, Clif bars, pretzels, jerky, gogurt, baby carrots, apples, bananas, crackers, cheez-n-crackers, string cheese, hot pockets or frozen burritos to microwave, garden burgers and breakfast  pastries to put in the toaster, yogurt drinks, etc. . Restaurant Menus. Make lists of your favorite restaurants & menu items. When family/friends want to help, you can give specific information without much thought. They can either bring you the food or send gift cards for just the right meals. Rosaura Carpenter Meals.  Take some time to make a few meals to put in the freezer ahead of time.  Easy to freeze meals can be anything such as soup, lasagna, chicken pie, or spaghetti sauce. . Set up a Meal Schedule.  Ask friends and family to sign up to bring you meals during the first few weeks of being home. (It can be passed around at baby showers!) You have no idea how helpful this will be until you are in the throes of parenting.  https://hamilton-woodard.com/ is a great website to check out. . Emotional Support . Know who to call when you're stressed out. Parenting a newborn is very challenging work. There are times when it totally overwhelms your normal coping abilities. EVERY NEW PARENT NEEDS TO HAVE A PLAN FOR WHO TO CALL WHEN THEY JUST CAN'T COPE ANY MORE. (And it has to be someone other than the baby's other parent!) Before your baby is born, come up with at least one person you can call for support - write their phone number down and post it on the refrigerator. Marland Kitchen Anxiety & Sadness. Baby blues are normal after pregnancy; however, there are more severe types of anxiety & sadness which can occur and should not be ignored.  They are always treatable, but you have to take the first  reaching out for help. [facility] offers a "Mom Talk" group which meets every Tuesday from 10 am - 11 am.  This group is for new moms who need support and connection after their babies are born.  Call [phone].  . Really, Really Helpful (Plan for them! Make sure these happen often!!) . Physical Support with Taking Care of Yourselves . Asking friends and family. Before your baby is born, set up a schedule of people who can come  and visit and help out (or ask a friend to schedule for you). Any time someone says "let me know what I can do to help," sign them up for a day. When they get there, their job is not to take care of the baby (that's your job and your joy). Their job is to take care of you!  . Postpartum doulas. If you don't have anyone you can call on for support, look into postpartum doulas:  professionals at helping parents with caring for baby, caring for themselves, getting breastfeeding started, and helping with household tasks. www.padanc.org is a helpful website for learning about doulas in our area. . Peer Support / Parent Groups . Why: One of the greatest ideas for new parents is to be around other new parents. Parent groups give you a chance to share and listen to others who are going through the same season of life, get a sense of what is normal infant development by watching several babies learn and grow, share your stories of triumph and struggles with empathetic ears, and forgive your own mistakes when you realize all parents are learning by trial and error. . Where to find: There are many places you can meet other new parents throughout our community.  [facility] offers the following classes for new moms and their little ones:  Baby and Me (Birth to Crawling) and Breastfeeding Support Group. Go to www.conehealthybaby.com or call [phone] for more information. . Time for your Relationship . It's easy to get so caught up in meeting baby's immediate needs that it's hard to find time to connect with your partner, and meet the needs of your relationship. It's also easy to forget what "quality time with your partner" actually looks like. If you take your baby on a date, you'd be amazed how much of your couple time is spent feeding the baby, diapering the baby, admiring the baby, and talking about the baby. . Dating: Try to take time for just the two of you. Babysitter tip: Sometimes when moms are  breastfeeding a newborn, they find it hard to figure out how to schedule outings around baby's unpredictable feeding schedules. Have the babysitter come for a three hour period. When she comes over, if baby has just eaten, you can leave right away, and come back in two hours. If baby hasn't fed recently, you start the date at home. Once baby gets hungry and gets a good feeding in, you can head out for the rest of your date time. . Date Nights at Home: If you can't get out, at least set aside one evening a week to prioritize your relationship: whenever baby dozes off or doesn't have any immediate needs, spend a little time focusing on each other. . Potential conflicts: The main relationship conflicts that come up for new parents are: issues related to sexuality, financial stresses, a feeling of an unfair division of household tasks, and conflicts in parenting styles. The more you can work on these issues before baby arrives, the better!  .   better!  Clint Guy and Frills (Don't forget these. and don't feel guilty for indulging in them!) . Everyone has something in life that is a fun little treat that they do just for themselves. It may be: reading the morning paper, or going for a daily jog, or having coffee with a friend once a week, or going to a movie on Friday nights, or fine chocolates, or bubble baths, or curling up with a good book. . Unless you do fun things for yourself every now and then, it's hard to have the energy for fun with your baby. Whatever your "special" treats are, make sure you find a way to continue to indulge in them after your baby is born. These special moments can recharge you, and allow you to return to baby with a new joy   PERINATAL MOOD DISORDERS: [place]   Emergency and Crisis Resources:  If you are an imminent risk to self or others, are experiencing intense personal distress, and/or have noticed significant changes in activities  of daily living, call:  . 911 . [facility]: [phone] . Mobile Crisis: (902)470-6621 . National Suicide Hotline: 505-048-2503 Or visit the following crisis centers: . Local Emergency Departments . [facility]: [address], [place]. Hours: 8:30AM-5PM. Insurance Accepted: Medicaid, Medicare, and Uninsured.  [facility]  [address] [place] Mon-Friday 8am-3pm  [phone]                                                                                    Non-Crisis Resources: To identify specific providers that are covered by your insurance, contact your insurance company or local agencies: [place]: [phone] [facility] and [place]: [phone] [place]: [phone] Postpartum [employer]- [place] [phone]                                                      Outpatient therapy and medication management providers:  [facility] [phone] Hours: 9AM-5PM  Insurance Accepted: Alben Spittle, Lorella Nimrod, Freddrick March, [place], Medicare  [facility] ([place]) [phone] Hours: 8AM-5PM  nsurance Accepted: All insurances EXCEPT AARP, [place], [place], and [place]: [phone]             Hours: 8AM-8PM Insurance Accepted: Cristal Ford, Freddrick March, [place], Medicare, [phone] [facility]: [phone] Hours: 8:30AM-7PM Insurance Accepted: Cristal Ford, Medicaid, Medicare, Tricare, [employer] Counseling:  [place] Accepted:  Holland Falling, Lorella Nimrod, [facility], [place], [facility] [phone] Hours: 9AM-5:30PM Insurance Accepted: Alben Spittle, Charlotte Crumb, and Medicaid, Medicare, [employer] [facility] Counsel[facility]:   [phone] Hours: 9am-5pm Insurance Accepted: BCBS; they do not accept Medicaid/Medicare [facility] [place]: [phone] Hours: 9am-9pm Insurance Accepted: All major insurance including Medicaid and Medicare [facility]: [phone]  Hours: 9AM-5:30PM Insurance Accepted: All insurances EXCEPT Medicaid and Medicare. [facility]: [place]: [phone] [place]:  [place] (support for children in the NICU and/or with special needs), [place] [employer]: [phone]                                                                                     Online Resources: Postpartum Support International: http://jones-berg.com/  800-944-4PPD 2Moms Supporting Moms:  www.momssupportingmoms.net

## 2019-06-14 NOTE — Telephone Encounter (Signed)
Left HIPPA-compliant message to call back Chanz Cahall from [facility] at [phone].   

## 2019-06-26 ENCOUNTER — Other Ambulatory Visit: Payer: Self-pay

## 2019-06-26 ENCOUNTER — Other Ambulatory Visit (HOSPITAL_COMMUNITY)
Admission: RE | Admit: 2019-06-26 | Discharge: 2019-06-26 | Disposition: A | Discharge status: Home / Self Care | Payer: Medicaid Other | Source: Ambulatory Visit | Attending: Women's Health | Admitting: Women's Health

## 2019-06-26 ENCOUNTER — Ambulatory Visit (INDEPENDENT_AMBULATORY_CARE_PROVIDER_SITE_OTHER): Payer: Medicaid Other | Admitting: Certified Nurse Midwife

## 2019-06-26 ENCOUNTER — Encounter: Payer: Self-pay | Admitting: Certified Nurse Midwife

## 2019-06-26 VITALS — BP 110/72 | HR 85 | Wt 154.0 lb

## 2019-06-26 DIAGNOSIS — O98313 Other infections with a predominantly sexual mode of transmission complicating pregnancy, third trimester: Secondary | ICD-10-CM

## 2019-06-26 DIAGNOSIS — Z3480 Encounter for supervision of other normal pregnancy, unspecified trimester: Secondary | ICD-10-CM | POA: Diagnosis present

## 2019-06-26 DIAGNOSIS — A6000 Herpesviral infection of urogenital system, unspecified: Secondary | ICD-10-CM

## 2019-06-26 DIAGNOSIS — Z3A36 36 weeks gestation of pregnancy: Secondary | ICD-10-CM

## 2019-06-26 DIAGNOSIS — Z8619 Personal history of other infectious and parasitic diseases: Secondary | ICD-10-CM

## 2019-06-26 NOTE — Patient Instructions (Signed)
Reasons to go to MAU:  1.  Contractions are  5 minutes apart or less, each last 1 minute, these have been going on for 1-2 hours, and you cannot walk or talk during them 2.  You have a large gush of fluid, or a trickle of fluid that will not stop and you have to wear a pad 3.  You have bleeding that is bright red, heavier than spotting--like menstrual bleeding (spotting can be normal in early labor or after a check of your cervix) 4.  You do not feel the baby moving like he/she normally does   Cervical Ripening: May try one or both  Red Raspberry Leaf capsules:  two 300mg  or 400mg  tablets with each meal, 2-3 times a day  Potential Side Effects Of Raspberry Leaf:  Most women do not experience any side effects from drinking raspberry leaf tea. However, nausea and loose stools are possible   Evening Primrose Oil capsules: may take 1 to 3 capsules daily. May also prick one to release the oil and insert it into your vagina at night.  Some of the potential side effects:  Upset stomach  Loose stools or diarrhea  Headaches  Nausea:    Walking, lunges, squats, intercourse and 

## 2019-06-26 NOTE — Addendum Note (Signed)
Addended by: Ernestina Patches on: 06/26/2019 02:57 PM   Modules accepted: Orders

## 2019-06-26 NOTE — Progress Notes (Signed)
   PRENATAL VISIT NOTE  Subjective:  Breanna [name] is a 34 y.o. [phone] at [redacted]w[redacted]d being seen today for ongoing prenatal care.  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  Patient reports no complaints.  Contractions: Irritability. Vag. Bleeding: None.  Movement: Present. Denies leaking of fluid.   The following portions of the patient's history were reviewed and updated as appropriate: allergies, current medications, past family history, past medical history, past social history, past surgical history and problem list.   Objective:   Vitals:   06/26/19 1435  BP: 110/72  Pulse: 85  Weight: 154 lb (69.9 kg)    Fetal Status: Fetal Heart Rate (bpm): 153 Fundal Height: 36 cm Movement: Present  Presentation: Vertex  General:  Alert, oriented and cooperative. Patient is in no acute distress.  Skin: Skin is warm and dry. No rash noted.   Cardiovascular: Normal heart rate noted  Respiratory: Normal respiratory effort, no problems with respiration noted  Abdomen: Soft, gravid, appropriate for gestational age.  Pain/Pressure: Absent     Pelvic: Cervical exam performed in the presence of a chaperone Dilation: Fingertip Effacement (%): Thick Station: Ballotable  Extremities: Normal range of motion.  Edema: Trace  Mental Status: Normal mood and affect. Normal behavior. Normal judgment and thought content.   Assessment and Plan:  Pregnancy: [id number] at [redacted]w[redacted]d 1. Supervision of other normal pregnancy, antepartum - Patient doing well, no complaints  - routine prenatal care - anticipatory guidance on upcoming appointments - Educated and discussed use of EPO, RRT, IC, walking,  squats and Miles circuit to stimulate contractions and cervical ripening  - plans to see Asher Muir for depression after delivery  - repeat CBC obtained at the last prenatal visit normal, encouraged to continue iron supplementation every other day as she is currently   2. History of herpes genitalis - continue suppression   Preterm labor symptoms and general obstetric precautions including but not limited to vaginal bleeding, contractions, leaking of fluid and fetal movement were reviewed in detail with the patient. Please refer to After Visit Summary for other counseling recommendations.   Return in about 1 week (around 07/03/2019) for LROB.  Future Appointments  Date Time Provider Department Center  07/03/2019  2:15 PM Marylen Ponto, NP [facility]  07/10/2019  1:55 PM Briant Sites [facility]    Sharyon Cable, [place]

## 2019-06-27 LAB — GC/CHLAMYDIA PROBE AMP (~~LOC~~) NOT AT ARMC
Chlamydia: NEGATIVE
Comment: NEGATIVE
Comment: NORMAL
Neisseria Gonorrhea: NEGATIVE

## 2019-06-30 LAB — CULTURE, BETA STREP (GROUP B ONLY): Strep Gp B Culture: POSITIVE — AB

## 2019-07-03 ENCOUNTER — Ambulatory Visit (INDEPENDENT_AMBULATORY_CARE_PROVIDER_SITE_OTHER): Payer: Medicaid Other | Admitting: Women's Health

## 2019-07-03 ENCOUNTER — Other Ambulatory Visit: Payer: Self-pay

## 2019-07-03 VITALS — BP 117/75 | HR 89 | Wt 156.0 lb

## 2019-07-03 DIAGNOSIS — Z3A37 37 weeks gestation of pregnancy: Secondary | ICD-10-CM

## 2019-07-03 DIAGNOSIS — Z8619 Personal history of other infectious and parasitic diseases: Secondary | ICD-10-CM

## 2019-07-03 DIAGNOSIS — Z3480 Encounter for supervision of other normal pregnancy, unspecified trimester: Secondary | ICD-10-CM

## 2019-07-03 DIAGNOSIS — O99820 Streptococcus B carrier state complicating pregnancy: Secondary | ICD-10-CM

## 2019-07-03 DIAGNOSIS — F329 Major depressive disorder, single episode, unspecified: Secondary | ICD-10-CM

## 2019-07-03 DIAGNOSIS — O99343 Other mental disorders complicating pregnancy, third trimester: Secondary | ICD-10-CM

## 2019-07-03 DIAGNOSIS — A6000 Herpesviral infection of urogenital system, unspecified: Secondary | ICD-10-CM

## 2019-07-03 DIAGNOSIS — F32A Depression, unspecified: Secondary | ICD-10-CM

## 2019-07-03 DIAGNOSIS — O99013 Anemia complicating pregnancy, third trimester: Secondary | ICD-10-CM

## 2019-07-03 DIAGNOSIS — O99019 Anemia complicating pregnancy, unspecified trimester: Secondary | ICD-10-CM

## 2019-07-03 DIAGNOSIS — B951 Streptococcus, group B, as the cause of diseases classified elsewhere: Secondary | ICD-10-CM | POA: Insufficient documentation

## 2019-07-03 DIAGNOSIS — N9089 Other specified noninflammatory disorders of vulva and perineum: Secondary | ICD-10-CM

## 2019-07-03 DIAGNOSIS — D649 Anemia, unspecified: Secondary | ICD-10-CM

## 2019-07-03 DIAGNOSIS — O99891 Other specified diseases and conditions complicating pregnancy: Secondary | ICD-10-CM

## 2019-07-03 DIAGNOSIS — R0989 Other specified symptoms and signs involving the circulatory and respiratory systems: Secondary | ICD-10-CM

## 2019-07-03 DIAGNOSIS — O98313 Other infections with a predominantly sexual mode of transmission complicating pregnancy, third trimester: Secondary | ICD-10-CM

## 2019-07-03 NOTE — Patient Instructions (Addendum)
Maternity Assessment Unit (MAU)  The Maternity Assessment Unit (MAU) is located at the [facility] at [facility]. The address is: [address], [place], [place], [place] [address]. Please see map below for additional directions.    The Maternity Assessment Unit is designed to help you during your pregnancy, and for up to 6 weeks after delivery, with any pregnancy- or postpartum-related emergencies, if you think you are in labor, or if your water has broken. For example, if you experience nausea and vomiting, vaginal bleeding, severe abdominal or pelvic pain, elevated blood pressure or other problems related to your pregnancy or postpartum time, please come to the Maternity Assessment Unit for assistance.        Signs and Symptoms of Labor Labor is your body's natural process of moving your baby, placenta, and umbilical cord out of your uterus. The process of labor usually starts when your baby is full-term, between 23 and 40 weeks of pregnancy. How will I know when I am close to going into labor? As your body prepares for labor and the birth of your baby, you may notice the following symptoms in the weeks and days before true labor starts:  Having a strong desire to get your home ready to receive your new baby. This is called nesting. Nesting may be a sign that labor is approaching, and it may occur several weeks before birth. Nesting may involve cleaning and organizing your home.  Passing a small amount of thick, bloody mucus out of your vagina (normal bloody show or losing your mucus plug). This may happen more than a week before labor begins, or it might occur right before labor begins as the opening of the cervix starts to widen (dilate). For some women, the entire mucus plug passes at once. For others, smaller portions of the mucus plug may gradually pass over several days.  Your baby moving (dropping) lower in your pelvis to get into position for birth  (lightening). When this happens, you may feel more pressure on your bladder and pelvic bone and less pressure on your ribs. This may make it easier to breathe. It may also cause you to need to urinate more often and have problems with bowel movements.  Having "practice contractions" (Braxton Hicks contractions) that occur at irregular (unevenly spaced) intervals that are more than 10 minutes apart. This is also called false labor. False labor contractions are common after exercise or sexual activity, and they will stop if you change position, rest, or drink fluids. These contractions are usually mild and do not get stronger over time. They may feel like: ? A backache or back pain. ? Mild cramps, similar to menstrual cramps. ? Tightening or pressure in your abdomen. Other early symptoms that labor may be starting soon include:  Nausea or loss of appetite.  Diarrhea.  Having a sudden burst of energy, or feeling very tired.  Mood changes.  Having trouble sleeping. How will I know when labor has begun? Signs that true labor has begun may include:  Having contractions that come at regular (evenly spaced) intervals and increase in intensity. This may feel like more intense tightening or pressure in your abdomen that moves to your back. ? Contractions may also feel like rhythmic pain in your upper thighs or back that comes and goes at regular intervals. ? For first-time mothers, this change in intensity of contractions often occurs at a more gradual pace. ? Women who have given birth before may notice  a more rapid progression of contraction changes.  Having a feeling of pressure in the vaginal area.  Your water breaking (rupture of membranes). This is when the sac of fluid that surrounds your baby breaks. When this happens, you will notice fluid leaking from your vagina. This may be clear or blood-tinged. Labor usually starts within 24 hours of your water breaking, but it may take longer to  begin. ? Some women notice this as a gush of fluid. ? Others notice that their underwear repeatedly becomes damp. Follow these instructions at home:   When labor starts, or if your water breaks, call your health care provider or nurse care line. Based on your situation, they will determine when you should go in for an exam.  When you are in early labor, you may be able to rest and manage symptoms at home. Some strategies to try at home include: ? Breathing and relaxation techniques. ? Taking a warm bath or shower. ? Listening to music. ? Using a heating pad on the lower back for pain. If you are directed to use heat:  Place a towel between your skin and the heat source.  Leave the heat on for 20-30 minutes.  Remove the heat if your skin turns bright red. This is especially important if you are unable to feel pain, heat, or cold. You may have a greater risk of getting burned. Get help right away if:  You have painful, regular contractions that are 5 minutes apart or less.  Labor starts before you are [redacted] weeks along in your pregnancy.  You have a fever.  You have a headache that does not go away.  You have bright red blood coming from your vagina.  You do not feel your baby moving.  You have a sudden onset of: ? Severe headache with vision problems. ? Nausea, vomiting, or diarrhea. ? Chest pain or shortness of breath. These symptoms may be an emergency. If your health care provider recommends that you go to the hospital or birth center where you plan to deliver, do not drive yourself. Have someone else drive you, or call emergency services (911 in the [place] Summary  Labor is your body's natural process of moving your baby, placenta, and umbilical cord out of your uterus.  The process of labor usually starts when your baby is full-term, between 94 and 40 weeks of pregnancy.  When labor starts, or if your water breaks, call your health care provider or nurse care line. Based  on your situation, they will determine when you should go in for an exam. This information is not intended to replace advice given to you by your health care provider. Make sure you discuss any questions you have with your health care provider. Document Revised: 11/02/2016 Document Reviewed: 07/10/2016 Elsevier Patient Education  2020 Elsevier Inc.        Group B Streptococcus Infection During Pregnancy Group B Streptococcus (GBS) is a type of bacteria that is often found in healthy people. It is commonly found in the rectum, vagina, and intestines. In people who are healthy and not pregnant, the bacteria rarely cause serious illness or complications. However, women who test positive for GBS during pregnancy can pass the bacteria to the baby during childbirth. This can cause serious infection in the baby after birth. Women with GBS may also have infections during their pregnancy or soon after childbirth. The infections include urinary tract infections (UTIs) or infections of the uterus. GBS also increases a woman's  risk of complications during pregnancy, such as early labor or delivery, miscarriage, or stillbirth. Routine testing for GBS is recommended for all pregnant women. What are the causes? This condition is caused by bacteria called Streptococcus agalactiae. What increases the risk? You may have a higher risk for GBS infection during pregnancy if you had one during a past pregnancy. What are the signs or symptoms? In most cases, GBS infection does not cause symptoms in pregnant women. If symptoms exist, they may include:  Labor that starts before the 37th week of pregnancy.  A UTI or bladder infection. This may cause a fever, frequent urination, or pain and burning during urination.  Fever during labor. There can also be a rapid heartbeat in the mother or baby. Rare but serious symptoms of a GBS infection in women include:  Blood infection (septicemia). This may cause fever,  chills, or confusion.  Lung infection (pneumonia). This may cause fever, chills, cough, rapid breathing, chest pain, or difficulty breathing.  Bone, joint, skin, or soft tissue infection. How is this diagnosed? You may be screened for GBS between week 35 and week 37 of pregnancy. If you have symptoms of preterm labor, you may be screened earlier. This condition is diagnosed based on lab test results from:  A swab of fluid from the vagina and rectum.  A urine sample. How is this treated? This condition is treated with antibiotic medicine. Antibiotic medicine may be given:  To you when you go into labor, or as soon as your water breaks. The medicines will continue until after you give birth. If you are having a cesarean delivery, you do not need antibiotics unless your water has broken.  To your baby, if he or she requires treatment. Your health care provider will check your baby to decide if he or she needs antibiotics to prevent a serious infection. Follow these instructions at home:  Take over-the-counter and prescription medicines only as told by your health care provider.  Take your antibiotic medicine as told by your health care provider. Do not stop taking the antibiotic even if you start to feel better.  Keep all pre-birth (prenatal) visits and follow-up visits as told by your health care provider. This is important. Contact a health care provider if:  You have pain or burning when you urinate.  You have to urinate more often than usual.  You have a fever or chills.  You develop a bad-smelling vaginal discharge. Get help right away if:  Your water breaks.  You go into labor.  You have severe pain in your abdomen.  You have difficulty breathing.  You have chest pain. These symptoms may represent a serious problem that is an emergency. Do not wait to see if the symptoms will go away. Get medical help right away. Call your local emergency services (911 in the [place] Do  not drive yourself to the hospital. Summary  GBS is a type of bacteria that is common in healthy people.  During pregnancy, colonization with GBS can cause serious complications for you or your baby.  Your health care provider will screen you between 35 and 37 weeks of pregnancy to determine if you are colonized with GBS.  If you are colonized with GBS during pregnancy, your health care provider will recommend antibiotics through an IV during labor.  After delivery, your baby will be evaluated for complications related to potential GBS infection and may require antibiotics to prevent a serious infection. This information is not intended to replace  advice given to you by your health care provider. Make sure you discuss any questions you have with your health care provider. Document Revised: 08/29/2018 Document Reviewed: 08/29/2018 Elsevier Patient Education  2020 Elsevier Inc.        Pregnancy and Genital Herpes  Genital herpes is an STI (sexually transmitted infection) that is caused by the herpes simplex virus (HSV). HSV can cause an outbreak of itching, blisters, and sores (ulcers) around the genitals and rectum. Even when the outbreak goes away, the virus stays in the body. If you are pregnant, you can pass HSV to your baby. If you become infected with HSV for the first time while you are pregnant, the virus can cause serious problems for your baby. If you had HSV before your pregnancy, the virus may not affect your baby as seriously. Babies that are infected with HSV are at risk for developing inflammation of the brain (encephalitis), damage to organs, and problems with development. How does this affect me? Your type of delivery may be affected. You may be able to have a vaginal delivery if you have no evidence of an outbreak when you go into labor. However, your baby may need to be delivered by C-section (cesarean delivery) if you have:  An active, recurrent, or new herpes outbreak  at the time of delivery. This is because the virus can pass to your baby through an infected birth canal. This can cause severe problems for your baby.  Any symptoms of infection in the areas around the genitals such as pain, burning, and itching, even if you do not have any ulcers in the birth canal. After delivery, you can breastfeed your baby. The virus will not be present in breast milk. While caring for your baby, you will need to take steps to avoid passing the virus on to your baby. How does this affect my baby? If the virus passes to your baby, it can cause serious problems. The virus can be passed to your baby:  Before delivery. The virus can be passed to your unborn baby through the placenta. This is more likely to happen if you get herpes for the first time in the first 3 months of pregnancy (first trimester). This may cause your baby to have a congenital disability.  During delivery. This is more likely to happen if you become infected for the first time late in your pregnancy.  After delivery. Your baby can get a herpes infection if you touch active ulcers and then touch your baby without washing your hands. The virus is less likely to pass to your baby if you had herpes before you became pregnant. This is because antibodies against the virus develop over a period of time. These antibodies help to protect the baby. How is this treated? This condition can be treated with medicines during pregnancy that are safe for you and your baby. These medicines can help to reduce symptoms, shorten an outbreak, and prevent another outbreak of the infection. If the infection happened before you became pregnant, you may need to take medicine late in your pregnancy to help to prevent a breakout at the time of delivery. Follow these instructions at home: To avoid passing the virus to your baby:  Wash your hands with soap and water often and before touching your baby.  If you have an outbreak, keep the  area clean and covered.  If ulcers are present on your breast, do not breastfeed from the affected breast. Contact a health care provider  if:  You have a rash, blisters, or ulcers in the area around your genitals or rectum.  You have burning, itching, or pain in the area around your genitals or rectum.  You have trouble urinating. Summary  Genital herpes is an STI (sexually transmitted infection) that is caused by the herpes simplex virus (HSV). If you are pregnant, you can pass the virus to your baby.  Even when the outbreak goes away, the virus stays in your body.  Genital herpes can be passed to your unborn or newborn baby and cause serious problems.  This condition can be treated with medicines during pregnancy that are safe for you and your baby. Medicines can treat your symptoms, shorten the length of an outbreak, and prevent another outbreak of the infection.  If you have signs or symptoms of a herpes outbreak when you go into labor, your health care provider may recommend a C-section (cesarean delivery) to lower the risk of passing the virus to your baby. This information is not intended to replace advice given to you by your health care provider. Make sure you discuss any questions you have with your health care provider. Document Revised: 05/27/2018 Document Reviewed: 05/05/2018 Elsevier Patient Education  2020 [employer].                         Safe Medications in Pregnancy    Acne: Benzoyl Peroxide Salicylic Acid  Backache/Headache: Tylenol: 2 regular strength every 4 hours OR              2 Extra strength every 6 hours  Colds/Coughs/Allergies: Benadryl (alcohol free) 25 mg every 6 hours as needed Breath right strips Claritin Cepacol throat lozenges Chloraseptic throat spray Cold-Eeze- up to three times per day Cough drops, alcohol free Flonase (by prescription only) Guaifenesin Mucinex Robitussin DM (plain only, alcohol free) Saline nasal  spray/drops Sudafed (pseudoephedrine) & Actifed ** use only after [redacted] weeks gestation and if you do not have high blood pressure Tylenol Vicks Vaporub Zinc lozenges Zyrtec   Constipation: Colace Ducolax suppositories Fleet enema Glycerin suppositories Metamucil Milk of magnesia Miralax Senokot Smooth move tea  Diarrhea: Kaopectate Imodium A-D  *NO pepto Bismol  Hemorrhoids: Anusol Anusol HC Preparation H Tucks  Indigestion: Tums Maalox Mylanta Zantac  Pepcid  Insomnia: Benadryl (alcohol free) 25mg  every 6 hours as needed Tylenol PM Unisom, no Gelcaps  Leg Cramps: Tums MagGel  Nausea/Vomiting:  Bonine Dramamine Emetrol Ginger extract Sea bands Meclizine  Nausea medication to take during pregnancy:  Unisom (doxylamine succinate 25 mg tablets) Take one tablet daily at bedtime. If symptoms are not adequately controlled, the dose can be increased to a maximum recommended dose of two tablets daily (1/2 tablet in the morning, 1/2 tablet mid-afternoon and one at bedtime). Vitamin B6 100mg  tablets. Take one tablet twice a day (up to 200 mg per day).  Skin Rashes: Aveeno products Benadryl cream or 25mg  every 6 hours as needed Calamine Lotion 1% cortisone cream  Yeast infection: Gyne-lotrimin 7 Monistat 7   **If taking multiple medications, please check labels to avoid duplicating the same active ingredients **take medication as directed on the label ** Do not exceed 4000 mg of tylenol in 24 hours **Do not take medications that contain aspirin or ibuprofen        https://www.cdc.gov/vaccines/hcp/vis/vis-statements/tdap.pdf">  Tdap (Tetanus, Diphtheria, Pertussis) Vaccine: What You Need to Know 1. Why get vaccinated? Tdap vaccine can prevent tetanus, diphtheria, and pertussis. Diphtheria and pertussis spread  from person to person. Tetanus enters the body through cuts or wounds.  TETANUS (T) causes painful stiffening of the muscles. Tetanus can  lead to serious health problems, including being unable to open the mouth, having trouble swallowing and breathing, or death.  DIPHTHERIA (D) can lead to difficulty breathing, heart failure, paralysis, or death.  PERTUSSIS (aP), also known as "whooping cough," can cause uncontrollable, violent coughing which makes it hard to breathe, eat, or drink. Pertussis can be extremely serious in babies and young children, causing pneumonia, convulsions, brain damage, or death. In teens and adults, it can cause weight loss, loss of bladder control, passing out, and rib fractures from severe coughing. 2. Tdap vaccine Tdap is only for children 7 years and older, adolescents, and adults.  Adolescents should receive a single dose of Tdap, preferably at age 25 or 99 years. Pregnant women should get a dose of Tdap during every pregnancy, to protect the newborn from pertussis. Infants are most at risk for severe, life-threatening complications from pertussis. Adults who have never received Tdap should get a dose of Tdap. Also, adults should receive a booster dose every 10 years, or earlier in the case of a severe and dirty wound or burn. Booster doses can be either Tdap or Td (a different vaccine that protects against tetanus and diphtheria but not pertussis). Tdap may be given at the same time as other vaccines. 3. Talk with your health care provider Tell your vaccine provider if the person getting the vaccine:  Has had an allergic reaction after a previous dose of any vaccine that protects against tetanus, diphtheria, or pertussis, or has any severe, life-threatening allergies.  Has had a coma, decreased level of consciousness, or prolonged seizures within 7 days after a previous dose of any pertussis vaccine (DTP, DTaP, or Tdap).  Has seizures or another nervous system problem.  Has ever had Guillain-Barr Syndrome (also called GBS).  Has had severe pain or swelling after a previous dose of any vaccine that  protects against tetanus or diphtheria. In some cases, your health care provider may decide to postpone Tdap vaccination to a future visit.  People with minor illnesses, such as a cold, may be vaccinated. People who are moderately or severely ill should usually wait until they recover before getting Tdap vaccine.  Your health care provider can give you more information. 4. Risks of a vaccine reaction  Pain, redness, or swelling where the shot was given, mild fever, headache, feeling tired, and nausea, vomiting, diarrhea, or stomachache sometimes happen after Tdap vaccine. People sometimes faint after medical procedures, including vaccination. Tell your provider if you feel dizzy or have vision changes or ringing in the ears.  As with any medicine, there is a very remote chance of a vaccine causing a severe allergic reaction, other serious injury, or death. 5. What if there is a serious problem? An allergic reaction could occur after the vaccinated person leaves the clinic. If you see signs of a severe allergic reaction (hives, swelling of the face and throat, difficulty breathing, a fast heartbeat, dizziness, or weakness), call 9-1-1 and get the person to the nearest hospital. For other signs that concern you, call your health care provider.  Adverse reactions should be reported to the Vaccine Adverse Event Reporting System (VAERS). Your health care provider will usually file this report, or you can do it yourself. Visit the VAERS website at www.vaers.SamedayNews.es or call 212-810-7582. VAERS is only for reporting reactions, and VAERS staff do not give  medical advice. 6. The National Vaccine Injury Compensation Program The [employer] Vaccine Injury Compensation Program (VICP) is a federal program that was created to compensate people who may have been injured by certain vaccines. Visit the VICP website at SpiritualWord.at or call 289-076-8372 to learn about the program and about filing a  claim. There is a time limit to file a claim for compensation. 7. How can I learn more?  Ask your health care provider.  Call your local or state health department.  Contact the Centers for Disease Control and Prevention (CDC): ? Call 779 658 6410 (1-800-CDC-INFO) or ? Visit CDC's website at PicCapture.uy Vaccine Information Statement Tdap (Tetanus, Diphtheria, Pertussis) Vaccine (05/18/2018) This information is not intended to replace advice given to you by your health care provider. Make sure you discuss any questions you have with your health care provider. Document Revised: 05/27/2018 Document Reviewed: 05/30/2018 Elsevier Patient Education  2020 [employer].

## 2019-07-03 NOTE — Progress Notes (Signed)
Subjective:  Breanna [name] is a 34 y.o. [id number] at [redacted]w[redacted]d being seen today for ongoing prenatal care.  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  Patient reports external vaginal itching that started after exam last week; patient reports she sometimes gets this irritation after use of lubricant in office and recently changed soaps. Pt encouraged to stop use of new soap and return to former soap or use DOVE white bars of soap. Patient also reports new onset cough, runny nose, sneezing, itchy eyes x2 days. Patient denies known history of seasonal allergies.  Contractions: Irregular. Vag. Bleeding: None.  Movement: Present. Denies leaking of fluid.    The following portions of the patient's history were reviewed and updated as appropriate: allergies, current medications, past family history, past medical history, past social history, past surgical history and problem list. Problem list updated.  Objective:   Vitals:   07/03/19 1421  BP: 117/75  Pulse: 89  Weight: 156 lb (70.8 kg)    Fetal Status: Fetal Heart Rate (bpm): 141   Movement: Present     General:  Alert, oriented and cooperative. Patient is in no acute distress.  Skin: Skin is warm and dry. No rash noted.   Cardiovascular: Normal heart rate noted  Respiratory: Normal respiratory effort, no problems with respiration noted  Abdomen: Soft, gravid, appropriate for gestational age. Pain/Pressure: Present     Pelvic: Vag. Bleeding: None     Cervical exam deferred       Normal external genitalia.  Extremities: Normal range of motion.  Edema: Trace  Mental Status: Normal mood and affect. Normal  behavior. Normal judgment and thought content.   Urinalysis:      Assessment and Plan:  Pregnancy: [id number] at [redacted]w[redacted]d  1. Supervision of other normal pregnancy, antepartum -Tdap information given and discussed, pt will consider -discussed non-hormonal birth control, information given  2. Antepartum anemia - repeat CBC obtained 06/14/2019 normal, encouraged to continue iron supplementation every other day as she is currently on -feraheme given 05/19/2019 and 05/26/2019  3. History of herpes genitalis -patient reports taking Valtrex 500mg  BID daily  4. Depression, unspecified depression type - anxiety and PTSD - plans to see for depression after delivery, declines appointment prior   5. Positive GBS test -discussed positive GBS status -will treat in labor  6. Vulvar irritation -pt encouraged to stop use of new soap and return to former soap or use DOVE white bars of soap. -pt can discuss further at next visit if necessary or call office for sooner visit if symptoms worsen/fail to improve  7. Symptoms of upper respiratory infection (URI) -discussed with patient possible COVID infection, pt to try OTC allergy medication and if no relief recommended COVID test   Term labor symptoms and general obstetric precautions including but not limited to vaginal bleeding, contractions, leaking of fluid and fetal movement were reviewed in detail with the patient. Please refer to After Visit Summary for other counseling recommendations.  Return in about 1 week (around 07/10/2019) for virtual LOB.   Mahi Zabriskie, 07/12/2019, NP

## 2019-07-07 ENCOUNTER — Inpatient Hospital Stay (HOSPITAL_COMMUNITY)
Admission: AD | Admit: 2019-07-07 | Discharge: 2019-07-07 | Disposition: A | Discharge status: Home / Self Care | Payer: Medicaid Other | Attending: Obstetrics & Gynecology | Admitting: Obstetrics & Gynecology

## 2019-07-07 ENCOUNTER — Encounter (HOSPITAL_COMMUNITY): Payer: Self-pay | Admitting: Obstetrics & Gynecology

## 2019-07-07 DIAGNOSIS — Z3A00 Weeks of gestation of pregnancy not specified: Secondary | ICD-10-CM | POA: Diagnosis not present

## 2019-07-07 DIAGNOSIS — F419 Anxiety disorder, unspecified: Secondary | ICD-10-CM

## 2019-07-07 DIAGNOSIS — R109 Unspecified abdominal pain: Secondary | ICD-10-CM

## 2019-07-07 DIAGNOSIS — F4310 Post-traumatic stress disorder, unspecified: Secondary | ICD-10-CM

## 2019-07-07 DIAGNOSIS — O98819 Other maternal infectious and parasitic diseases complicating pregnancy, unspecified trimester: Secondary | ICD-10-CM | POA: Insufficient documentation

## 2019-07-07 DIAGNOSIS — Z8619 Personal history of other infectious and parasitic diseases: Secondary | ICD-10-CM

## 2019-07-07 DIAGNOSIS — O479 False labor, unspecified: Secondary | ICD-10-CM | POA: Insufficient documentation

## 2019-07-07 DIAGNOSIS — Z3480 Encounter for supervision of other normal pregnancy, unspecified trimester: Secondary | ICD-10-CM

## 2019-07-07 DIAGNOSIS — B951 Streptococcus, group B, as the cause of diseases classified elsewhere: Secondary | ICD-10-CM

## 2019-07-07 NOTE — MAU Note (Signed)
Irreg ctxs for several days. Closer now but still not terribly intense. Denies VB or LOF.

## 2019-07-07 NOTE — Discharge Instructions (Signed)
Braxton Hicks Contractions °Contractions of the uterus can occur throughout pregnancy, but they are not always a sign that you are in labor. You may have practice contractions called Braxton Hicks contractions. These false labor contractions are sometimes confused with true labor. °What are Braxton Hicks contractions? °Braxton Hicks contractions are tightening movements that occur in the muscles of the uterus before labor. Unlike true labor contractions, these contractions do not result in opening (dilation) and thinning of the cervix. Toward the end of pregnancy (32-34 weeks), Braxton Hicks contractions can happen more often and may become stronger. These contractions are sometimes difficult to tell apart from true labor because they can be very uncomfortable. You should not feel embarrassed if you go to the hospital with false labor. °Sometimes, the only way to tell if you are in true labor is for your health care provider to look for changes in the cervix. The health care provider will do a physical exam and may monitor your contractions. If you are not in true labor, the exam should show that your cervix is not dilating and your water has not broken. °If there are no other health problems associated with your pregnancy, it is completely safe for you to be sent home with false labor. You may continue to have Braxton Hicks contractions until you go into true labor. °How to tell the difference between true labor and false labor °True labor °· Contractions last 30-70 seconds. °· Contractions become very regular. °· Discomfort is usually felt in the top of the uterus, and it spreads to the lower abdomen and low back. °· Contractions do not go away with walking. °· Contractions usually become more intense and increase in frequency. °· The cervix dilates and gets thinner. °False labor °· Contractions are usually shorter and not as strong as true labor contractions. °· Contractions are usually irregular. °· Contractions  are often felt in the front of the lower abdomen and in the groin. °· Contractions may go away when you walk around or change positions while lying down. °· Contractions get weaker and are shorter-lasting as time goes on. °· The cervix usually does not dilate or become thin. °Follow these instructions at home: ° °· Take over-the-counter and prescription medicines only as told by your health care provider. °· Keep up with your usual exercises and follow other instructions from your health care provider. °· Eat and drink lightly if you think you are going into labor. °· If Braxton Hicks contractions are making you uncomfortable: °? Change your position from lying down or resting to walking, or change from walking to resting. °? Sit and rest in a tub of warm water. °? Drink enough fluid to keep your urine pale yellow. Dehydration may cause these contractions. °? Do slow and deep breathing several times an hour. °· Keep all follow-up prenatal visits as told by your health care provider. This is important. °Contact a health care provider if: °· You have a fever. °· You have continuous pain in your abdomen. °Get help right away if: °· Your contractions become stronger, more regular, and closer together. °· You have fluid leaking or gushing from your vagina. °· You pass blood-tinged mucus (bloody show). °· You have bleeding from your vagina. °· You have low back pain that you never had before. °· You feel your baby’s head pushing down and causing pelvic pressure. °· Your baby is not moving inside you as much as it used to. °Summary °· Contractions that occur before labor are   called Braxton Hicks contractions, false labor, or practice contractions. °· Braxton Hicks contractions are usually shorter, weaker, farther apart, and less regular than true labor contractions. True labor contractions usually become progressively stronger and regular, and they become more frequent. °· Manage discomfort from Braxton Hicks contractions  by changing position, resting in a warm bath, drinking plenty of water, or practicing deep breathing. °This information is not intended to replace advice given to you by your health care provider. Make sure you discuss any questions you have with your health care provider. °Document Revised: 01/15/2017 Document Reviewed: 06/18/2016 °Elsevier Patient Education © 2020 Elsevier Inc. ° °

## 2019-07-07 NOTE — Progress Notes (Signed)
I have communicated with Drenda Freeze C., cnm and reviewed vital signs:  Vitals:   07/07/19 0131 07/07/19 0220  BP: 109/70 111/64  Pulse: (!) 103 84  Resp: 18   Temp: 97.9 F (36.6 C)     Vaginal exam:  Dilation: 1 Effacement (%): 50 Station: -2 Presentation: Vertex Exam by:: Karl Ito, rnc ,   Also reviewed contraction pattern and that non-stress test is reactive.  It has been documented that patient is contracting every 3-7 minutes but not in labor.  Patient complains of headache but refuses any interventions.  Based on this report provider has given order for discharge.  A discharge order and diagnosis entered by a provider.   Labor discharge instructions reviewed with patient.

## 2019-07-08 ENCOUNTER — Inpatient Hospital Stay (HOSPITAL_COMMUNITY): Payer: Medicaid Other

## 2019-07-08 ENCOUNTER — Inpatient Hospital Stay (HOSPITAL_COMMUNITY)
Admission: AD | Admit: 2019-07-08 | Discharge: 2019-07-08 | Disposition: A | Discharge status: Home / Self Care | Payer: Medicaid Other | Attending: Obstetrics and Gynecology | Admitting: Obstetrics and Gynecology

## 2019-07-08 ENCOUNTER — Encounter: Payer: Self-pay | Admitting: Student

## 2019-07-08 ENCOUNTER — Encounter (HOSPITAL_COMMUNITY): Payer: Self-pay | Admitting: Obstetrics and Gynecology

## 2019-07-08 DIAGNOSIS — Z888 Allergy status to other drugs, medicaments and biological substances status: Secondary | ICD-10-CM | POA: Insufficient documentation

## 2019-07-08 DIAGNOSIS — O368130 Decreased fetal movements, third trimester, not applicable or unspecified: Secondary | ICD-10-CM

## 2019-07-08 DIAGNOSIS — Z8619 Personal history of other infectious and parasitic diseases: Secondary | ICD-10-CM

## 2019-07-08 DIAGNOSIS — O98513 Other viral diseases complicating pregnancy, third trimester: Secondary | ICD-10-CM | POA: Insufficient documentation

## 2019-07-08 DIAGNOSIS — O98313 Other infections with a predominantly sexual mode of transmission complicating pregnancy, third trimester: Secondary | ICD-10-CM | POA: Insufficient documentation

## 2019-07-08 DIAGNOSIS — O368190 Decreased fetal movements, unspecified trimester, not applicable or unspecified: Secondary | ICD-10-CM | POA: Insufficient documentation

## 2019-07-08 DIAGNOSIS — Z3480 Encounter for supervision of other normal pregnancy, unspecified trimester: Secondary | ICD-10-CM

## 2019-07-08 DIAGNOSIS — O99340 Other mental disorders complicating pregnancy, unspecified trimester: Secondary | ICD-10-CM | POA: Insufficient documentation

## 2019-07-08 DIAGNOSIS — Z87891 Personal history of nicotine dependence: Secondary | ICD-10-CM | POA: Insufficient documentation

## 2019-07-08 DIAGNOSIS — R079 Chest pain, unspecified: Secondary | ICD-10-CM | POA: Diagnosis not present

## 2019-07-08 DIAGNOSIS — R109 Unspecified abdominal pain: Secondary | ICD-10-CM | POA: Insufficient documentation

## 2019-07-08 DIAGNOSIS — F4310 Post-traumatic stress disorder, unspecified: Secondary | ICD-10-CM | POA: Diagnosis not present

## 2019-07-08 DIAGNOSIS — O99891 Other specified diseases and conditions complicating pregnancy: Secondary | ICD-10-CM

## 2019-07-08 DIAGNOSIS — Z3689 Encounter for other specified antenatal screening: Secondary | ICD-10-CM

## 2019-07-08 DIAGNOSIS — B951 Streptococcus, group B, as the cause of diseases classified elsewhere: Secondary | ICD-10-CM

## 2019-07-08 DIAGNOSIS — R05 Cough: Secondary | ICD-10-CM | POA: Diagnosis not present

## 2019-07-08 DIAGNOSIS — O99343 Other mental disorders complicating pregnancy, third trimester: Secondary | ICD-10-CM

## 2019-07-08 DIAGNOSIS — Z3A38 38 weeks gestation of pregnancy: Secondary | ICD-10-CM | POA: Diagnosis not present

## 2019-07-08 DIAGNOSIS — U071 COVID-19: Secondary | ICD-10-CM | POA: Diagnosis not present

## 2019-07-08 DIAGNOSIS — O99820 Streptococcus B carrier state complicating pregnancy: Secondary | ICD-10-CM | POA: Diagnosis not present

## 2019-07-08 DIAGNOSIS — Z91040 Latex allergy status: Secondary | ICD-10-CM | POA: Insufficient documentation

## 2019-07-08 DIAGNOSIS — J069 Acute upper respiratory infection, unspecified: Secondary | ICD-10-CM | POA: Diagnosis not present

## 2019-07-08 DIAGNOSIS — R300 Dysuria: Secondary | ICD-10-CM | POA: Diagnosis not present

## 2019-07-08 DIAGNOSIS — M791 Myalgia, unspecified site: Secondary | ICD-10-CM

## 2019-07-08 DIAGNOSIS — A6000 Herpesviral infection of urogenital system, unspecified: Secondary | ICD-10-CM | POA: Insufficient documentation

## 2019-07-08 DIAGNOSIS — R509 Fever, unspecified: Secondary | ICD-10-CM

## 2019-07-08 DIAGNOSIS — F419 Anxiety disorder, unspecified: Secondary | ICD-10-CM

## 2019-07-08 HISTORY — DX: Anemia, unspecified: D64.9

## 2019-07-08 LAB — URINALYSIS, ROUTINE W REFLEX MICROSCOPIC
Bilirubin Urine: NEGATIVE
Glucose, UA: NEGATIVE mg/dL
Hgb urine dipstick: NEGATIVE
Ketones, ur: 20 mg/dL — AB
Nitrite: NEGATIVE
Protein, ur: 30 mg/dL — AB
Specific Gravity, Urine: 1.019 (ref 1.005–1.030)
pH: 7 (ref 5.0–8.0)

## 2019-07-08 MED ORDER — ONDANSETRON HCL 8 MG PO TABS
8.0000 mg | ORAL_TABLET | Freq: Three times a day (TID) | ORAL | 0 refills | Status: DC | PRN
Start: 2019-07-08 — End: 2019-07-18

## 2019-07-08 MED ORDER — ACETAMINOPHEN 325 MG PO TABS
650.0000 mg | ORAL_TABLET | Freq: Once | ORAL | Status: AC
  Administered 2019-07-08: 650 mg via ORAL
  Filled 2019-07-08: qty 2

## 2019-07-08 MED ORDER — LACTATED RINGERS IV BOLUS
1000.0000 mL | Freq: Once | INTRAVENOUS | Status: AC
  Administered 2019-07-08: 1000 mL via INTRAVENOUS

## 2019-07-08 MED ORDER — ONDANSETRON HCL 4 MG PO TABS
8.0000 mg | ORAL_TABLET | Freq: Once | ORAL | Status: AC
  Administered 2019-07-08: 8 mg via ORAL
  Filled 2019-07-08: qty 2

## 2019-07-08 NOTE — MAU Note (Signed)
Breanna [name] is a 34 y.o. at [redacted]w[redacted]d here in MAU reporting:  +COVID test from this morning at  [facility] Current symptoms include congestion, runny nose, fever, cough, generalized weakness as she reports heaviness in her legs. Vomiting yesterday. Yesterday around 7pm started feeling worse (flu like) so she called 911. They took her to [facility] where she reports she got fluids and tylenol. Pain score: denies at this time. Reports some chest burning with coughing. Vitals:   07/08/19 1028  BP: 114/68  Pulse: (!) 112  Resp: 20  Temp: 99.3 F (37.4 C)  SpO2: 98%    + decreased fetal movement Last felt movement yesterday FHR: 154

## 2019-07-08 NOTE — Discharge Instructions (Signed)
**Call this number on Monday for the post-covid care center. Tell clinic that you are [redacted] weeks pregnant, Covid positive and your OB provider wanted you to be seen as a check in on Monday or Tuesday, which is Day 9 and Day 10 of your covid symptoms. We want to make sure that your oxygenation is ok, so you will need someone to check your pulse oximetry.  -Keep your OB virtual visit on 5/24; do not come to the [facility] clinic for an in-person visit.  -Go to the [facility] if you have any difficulty breathing or shortness of breath -Take 650 mg of Tylenol every 8 hours for pain, lots of fluids, avoid ibuprofen in the third trimester.   Information on the Post-Covid Clinic:  The [facility] will move to the permanent location at [address] effective  Monday 02/24/3233.  To refer patients to the Post-COVID Care Center starting Monday 05/29/2019 please call [phone]       Person Under Monitoring Name: Breanna [name]  Location: [address] [place] [address]         Infection Prevention Recommendations for Individuals Confirmed to have, or Being Evaluated for, 2019 Novel Coronavirus (COVID-19) Infection Who Receive Care at Home  Individuals who are confirmed to have, or are being evaluated for, COVID-19 should follow the prevention steps below until a healthcare provider or local or state health department says they can return to normal activities.  Stay home except to get medical care You should restrict activities outside your home, except for getting medical care. Do not go to work, school, or public areas, and do not use public transportation or taxis.  Call ahead before visiting your doctor Before your medical appointment, call the healthcare provider and tell them that you have, or are being evaluated for, COVID-19 infection. This will help the healthcare provider's office take steps to keep other people from getting infected. Ask your healthcare provider to  call the local or state health department.  Monitor your symptoms Seek prompt medical attention if your illness is worsening (e.g., difficulty breathing). Before going to your medical appointment, call the healthcare provider and tell them that you have, or are being evaluated for, COVID-19 infection. Ask your healthcare provider to call the local or state health department.  Wear a facemask You should wear a facemask that covers your nose and mouth when you are in the same room with other people and when you visit a healthcare provider. People who live with or visit you should also wear a facemask while they are in the same room with you.  Separate yourself from other people in your home As much as possible, you should stay in a different room from other people in your home. Also, you should use a separate bathroom, if available.  Avoid sharing household items You should not share dishes, drinking glasses, cups, eating utensils, towels, bedding, or other items with other people in your home. After using these items, you should wash them thoroughly with soap and water.  Cover your coughs and sneezes Cover your mouth and nose with a tissue when you cough or sneeze, or you can cough or sneeze into your sleeve. Throw used tissues in a lined trash can, and immediately wash your hands with soap and water for at least 20 seconds or use an alcohol-based hand rub.  Wash your [employer] your hands often and thoroughly with soap and water for at least 20 seconds. You can  use an alcohol-based hand sanitizer if soap and water are not available and if your hands are not visibly dirty. Avoid touching your eyes, nose, and mouth with unwashed hands.   Prevention Steps for Caregivers and Household Members of Individuals Confirmed to have, or Being Evaluated for, COVID-19 Infection Being Cared for in the Home  If you live with, or provide care at home for, a person confirmed to have, or being  evaluated for, COVID-19 infection please follow these guidelines to prevent infection:  Follow healthcare provider's instructions Make sure that you understand and can help the patient follow any healthcare provider instructions for all care.  Provide for the patient's basic needs You should help the patient with basic needs in the home and provide support for getting groceries, prescriptions, and other personal needs.  Monitor the patient's symptoms If they are getting sicker, call his or her medical provider and tell them that the patient has, or is being evaluated for, COVID-19 infection. This will help the healthcare provider's office take steps to keep other people from getting infected. Ask the healthcare provider to call the local or state health department.  Limit the number of people who have contact with the patient  If possible, have only one caregiver for the patient.  Other household members should stay in another home or place of residence. If this is not possible, they should stay  in another room, or be separated from the patient as much as possible. Use a separate bathroom, if available.  Restrict visitors who do not have an essential need to be in the home.  Keep older adults, very young children, and other sick people away from the patient Keep older adults, very young children, and those who have compromised immune systems or chronic health conditions away from the patient. This includes people with chronic heart, lung, or kidney conditions, diabetes, and cancer.  Ensure good ventilation Make sure that shared spaces in the home have good air flow, such as from an air conditioner or an opened window, weather permitting.  Wash your hands often  Wash your hands often and thoroughly with soap and water for at least 20 seconds. You can use an alcohol based hand sanitizer if soap and water are not available and if your hands are not visibly dirty.  Avoid touching  your eyes, nose, and mouth with unwashed hands.  Use disposable paper towels to dry your hands. If not available, use dedicated cloth towels and replace them when they become wet.  Wear a facemask and gloves  Wear a disposable facemask at all times in the room and gloves when you touch or have contact with the patient's blood, body fluids, and/or secretions or excretions, such as sweat, saliva, sputum, nasal mucus, vomit, urine, or feces.  Ensure the mask fits over your nose and mouth tightly, and do not touch it during use.  Throw out disposable facemasks and gloves after using them. Do not reuse.  Wash your hands immediately after removing your facemask and gloves.  If your personal clothing becomes contaminated, carefully remove clothing and launder. Wash your hands after handling contaminated clothing.  Place all used disposable facemasks, gloves, and other waste in a lined container before disposing them with other household waste.  Remove gloves and wash your hands immediately after handling these items.  Do not share dishes, glasses, or other household items with the patient  Avoid sharing household items. You should not share dishes, drinking glasses, cups, eating utensils, towels, bedding,  or other items with a patient who is confirmed to have, or being evaluated for, COVID-19 infection.  After the person uses these items, you should wash them thoroughly with soap and water.  Wash laundry thoroughly  Immediately remove and wash clothes or bedding that have blood, body fluids, and/or secretions or excretions, such as sweat, saliva, sputum, nasal mucus, vomit, urine, or feces, on them.  Wear gloves when handling laundry from the patient.  Read and follow directions on labels of laundry or clothing items and detergent. In general, wash and dry with the warmest temperatures recommended on the label.  Clean all areas the individual has used often  Clean all touchable surfaces,  such as counters, tabletops, doorknobs, bathroom fixtures, toilets, phones, keyboards, tablets, and bedside tables, every day. Also, clean any surfaces that may have blood, body fluids, and/or secretions or excretions on them.  Wear gloves when cleaning surfaces the patient has come in contact with.  Use a diluted bleach solution (e.g., dilute bleach with 1 part bleach and 10 parts water) or a household disinfectant with a label that says EPA-registered for coronaviruses. To make a bleach solution at home, add 1 tablespoon of bleach to 1 quart (4 cups) of water. For a larger supply, add  cup of bleach to 1 gallon (16 cups) of water.  Read labels of cleaning products and follow recommendations provided on product labels. Labels contain instructions for safe and effective use of the cleaning product including precautions you should take when applying the product, such as wearing gloves or eye protection and making sure you have good ventilation during use of the product.  Remove gloves and wash hands immediately after cleaning.  Monitor yourself for signs and symptoms of illness Caregivers and household members are considered close contacts, should monitor their health, and will be asked to limit movement outside of the home to the extent possible. Follow the monitoring steps for close contacts listed on the symptom monitoring form.   ? If you have additional questions, contact your local health department or call the epidemiologist on call at [phone] (available 24/7). ? This guidance is subject to change. For the most up-to-date guidance from [facility], please refer to their website: [employer]  10 Things You Can Do to Manage Your COVID-19 Symptoms at Home If you have possible or confirmed COVID-19: Stay home from work and school. And stay away from other public places. If you must go out, avoid using any kind of public  transportation, ridesharing, or taxis. Monitor your symptoms carefully. If your symptoms get worse, call your healthcare provider immediately. Get rest and stay hydrated. If you have a medical appointment, call the healthcare provider ahead of time and tell them that you have or may have COVID-19. For medical emergencies, call 911 and notify the dispatch personnel that you have or may have COVID-19. Cover your cough and sneezes with a tissue or use the inside of your elbow. Wash your hands often with soap and water for at least 20 seconds or clean your hands with an alcohol-based hand sanitizer that contains at least 60% alcohol. As much as possible, stay in a specific room and away from other people in your home. Also, you should use a separate bathroom, if available. If you need to be around other people in or outside of the home, wear a mask. Avoid sharing personal items with other people in your household, like dishes, towels, and bedding. Clean all surfaces that are touched often, like counters, tabletops,  and doorknobs. Use household cleaning sprays or wipes according to the label instructions. [email or web address] 08/17/2018 This information is not intended to replace advice given to you by your health care provider. Make sure you discuss any questions you have with your health care provider. Document Revised: 01/19/2019 Document Reviewed: 01/19/2019 Elsevier Patient Education  [place].

## 2019-07-08 NOTE — MAU Provider Note (Addendum)
Patient Breanna [name] is 34 y.o. [id number]  at [redacted]w[redacted]d here with complaints of cough, leg pain and concern about  complaints of decreased fetal movements. She reports that she was diagnosed with COVID at [facility] last night (5/21) and has muscle and bone aches, coughing, sneezing. She denies any LOF, vaginal bleeding, contractions.   She just came in to "make sure everything is ok".  History     CSN: [id number]  Arrival date and time: 07/08/19 1017   First Provider Initiated Contact with Patient 07/08/19 1143      Chief Complaint  Patient presents with  . Nasal Congestion  . Cough  . Fever  . Generalized Body Aches  . Decreased Fetal Movement   URI  This is a new problem. The current episode started in the past 7 days. The problem has been unchanged. The maximum temperature recorded prior to her arrival was 100.4 - 100.9 F. Associated symptoms include congestion and coughing. Associated symptoms comments: She reports dysuria, but she cannot tell if it is vaginal irritation or a UTI. Marland Kitchen She has tried acetaminophen (Been trying tylenol at home; it was infant tylenol) for the symptoms.    OB History    Gravida  6   Para  3   Term  3   Preterm  0   AB  2   Living  3     SAB  1   TAB  1   Ectopic  0   Multiple  0   Live Births  3           Past Medical History:  Diagnosis Date  . Anemia   . Anxiety   . BV (bacterial vaginosis)   . Depression   . Herpes genitalia   . History of postpartum depression, currently pregnant   . PTSD (post-traumatic stress disorder)   . UTI (lower urinary tract infection)     Past Surgical History:  Procedure Laterality Date  . APPENDECTOMY  2018  . MOUTH SURGERY    . RHINOPLASTY      Family History  Problem Relation Age of Onset  . Thyroid disease Maternal Grandmother   . Bipolar disorder Maternal Grandmother   . Diabetes Paternal Grandmother   . Heart disease Paternal Grandmother   . Hypertension Paternal  Grandmother   . Alcohol abuse Neg Hx   . Arthritis Neg Hx   . Asthma Neg Hx   . Birth defects Neg Hx   . Cancer Neg Hx   . COPD Neg Hx   . Depression Neg Hx   . Drug abuse Neg Hx   . Early death Neg Hx   . Hearing loss Neg Hx   . Hyperlipidemia Neg Hx   . Learning disabilities Neg Hx   . Kidney disease Neg Hx   . Mental illness Neg Hx   . Mental retardation Neg Hx   . Miscarriages / Stillbirths Neg Hx   . Stroke Neg Hx   . Vision loss Neg Hx     Social History   Tobacco Use  . Smoking status: Former Smoker    Types: Cigarettes    Quit date: 02/17/2012    Years since quitting: 7.3  . Smokeless tobacco: Never Used  Substance Use Topics  . Alcohol use: Not Currently    Comment: ocasionally  . Drug use: No    Allergies:  Allergies  Allergen Reactions  . Latex Itching    Vaginal irritation  . Tape Rash  No medications prior to admission.    Review of Systems  HENT: Positive for congestion.   Respiratory: Positive for cough.   Gastrointestinal: Negative.   Genitourinary: Negative.   Musculoskeletal: Negative.   Neurological: Negative.   Psychiatric/Behavioral: Negative.    Physical Exam   Blood pressure 101/61, pulse 100, temperature 100.3 F (37.9 C), temperature source Oral, resp. rate 18, last menstrual period 10/12/2018, SpO2 96 %, unknown if currently breastfeeding.  Physical Exam  Constitutional: She appears well-developed.  HENT:  Head: Normocephalic.  Eyes: Pupils are equal, round, and reactive to light.  Respiratory: Effort normal and breath sounds normal. No respiratory distress. She has no wheezes. She has no rales. She exhibits no tenderness.  GI: Soft. She exhibits no distension and no mass. There is no guarding.  Neurological: She is alert.  Skin: Skin is warm and dry.    MAU Course  Procedures  MDM -NST: 155 bpm, mod var, present acel, neg decels, no contractions. NST reactive -Chest x-ray unremarkable; no signs of active disease.   -patient feels better after 1 L of fluid, zofran, tylenol. She tolerated PO challenge.  -Patient is alert and oriented times 3; her lungs are CTA bilaterally, no rales or wheezing. She does not have any respiratory distress while in MAU; talking, eating and drinking with partner at the bedside.   Patient Vitals for the past 24 hrs:  BP Temp Temp src Pulse Resp SpO2  07/08/19 1330 101/61 100.3 F (37.9 C) Oral 100 18 96 %  07/08/19 1210 -- -- -- -- -- 98 %  07/08/19 1200 -- -- -- -- -- 98 %  07/08/19 1145 -- -- -- -- -- 98 %  07/08/19 1130 -- -- -- -- -- 98 %  07/08/19 1125 -- -- -- -- -- 99 %  07/08/19 1121 (!) 92/55 -- -- (!) 102 18 --  07/08/19 1115 -- -- -- -- -- 97 %  07/08/19 1028 114/68 99.3 F (37.4 C) Oral (!) 112 20 98 %    Assessment and Plan   1. NST (non-stress test) reactive   2. Positive GBS test   3. History of herpes genitalis   4. Supervision of other normal pregnancy, antepartum   5. PTSD (post-traumatic stress disorder)   6. Anxiety   7. Abdominal cramping    2. Patient stable for discharge with plans to call Post COVID clinic on Monday morning. Detailed instructions given as well as phone number.  3. Detailed instructions on self-care and comfort measures given at home, including isolation and testing for family members. Patient and partner verbalized understanding.  Take Zofran every 8 hours as needed, other comfort measures given.  4. Continue to monitor for SOB or difficulty breathing; go to [facility] if she has any difficulty breathing, shortness of breath.  5. Message sent to [facility] Inbox for patient to be seen in Monday or Tuesday (Day 9 or Day 10 of symptoms).  5. Keep OB virtual appointment on 07/10/2019.   Charlesetta Garibaldi Deanda Ruddell 07/08/2019, 4:41 PM

## 2019-07-09 LAB — CULTURE, OB URINE

## 2019-07-10 ENCOUNTER — Other Ambulatory Visit: Payer: Self-pay | Admitting: Obstetrics & Gynecology

## 2019-07-10 ENCOUNTER — Other Ambulatory Visit: Payer: Self-pay

## 2019-07-10 ENCOUNTER — Telehealth (INDEPENDENT_AMBULATORY_CARE_PROVIDER_SITE_OTHER): Payer: Medicaid Other | Admitting: Advanced Practice Midwife

## 2019-07-10 DIAGNOSIS — B951 Streptococcus, group B, as the cause of diseases classified elsewhere: Secondary | ICD-10-CM

## 2019-07-10 DIAGNOSIS — O0990 Supervision of high risk pregnancy, unspecified, unspecified trimester: Secondary | ICD-10-CM

## 2019-07-10 DIAGNOSIS — A6000 Herpesviral infection of urogenital system, unspecified: Secondary | ICD-10-CM

## 2019-07-10 DIAGNOSIS — Z3A38 38 weeks gestation of pregnancy: Secondary | ICD-10-CM

## 2019-07-10 DIAGNOSIS — O99820 Streptococcus B carrier state complicating pregnancy: Secondary | ICD-10-CM

## 2019-07-10 DIAGNOSIS — O98313 Other infections with a predominantly sexual mode of transmission complicating pregnancy, third trimester: Secondary | ICD-10-CM

## 2019-07-10 DIAGNOSIS — Z8619 Personal history of other infectious and parasitic diseases: Secondary | ICD-10-CM

## 2019-07-10 DIAGNOSIS — U071 COVID-19: Secondary | ICD-10-CM

## 2019-07-10 DIAGNOSIS — O0993 Supervision of high risk pregnancy, unspecified, third trimester: Secondary | ICD-10-CM

## 2019-07-10 DIAGNOSIS — O98513 Other viral diseases complicating pregnancy, third trimester: Secondary | ICD-10-CM

## 2019-07-10 NOTE — Progress Notes (Signed)
OBSTETRICS PRENATAL VIRTUAL VISIT ENCOUNTER NOTE  Provider location: [facility]   I connected with Breanna [name] on 07/10/19 at  34:55 PM EDT by MyChart Video Encounter at home and verified that I am speaking with the correct person using two identifiers.   I discussed the limitations, risks, security and privacy concerns of performing an evaluation and management service virtually and the availability of in person appointments. I also discussed with the patient that there may be a patient responsible charge related to this service. The patient expressed understanding and agreed to proceed. Subjective:  Breanna [name] is a 34 y.o. [id number] at [redacted]w[redacted]d being seen today for ongoing prenatal care.  She is currently monitored for the following issues for this low-risk pregnancy and has Mental disorders of mother, antepartum; Abdominal cramping; Supervision of other normal pregnancy, antepartum; Fibromyalgia; PTSD (post-traumatic stress disorder); Anxiety; Depression; Herpes genitalia; History of herpes genitalis; Anemia in pregnancy; Positive GBS test; and COVID-19 affecting pregnancy in third trimester on their problem list.  Patient reports recurrent non-productive cough in setting of recent COVID diagnosis.  Contractions: Not present. Vag. Bleeding: None.  Movement: Present. Denies any leaking of fluid.   The following portions of the patient's history were reviewed and updated as appropriate: allergies, current medications, past family history, past medical history, past social history, past surgical history and problem list.   Objective:  There were no vitals filed for this visit.  Fetal Status:     Movement: Present     General:  Alert, oriented and cooperative. Patient is in no acute distress.  Respiratory: Normal respiratory effort, no problems with respiration noted  Mental Status: Normal mood and affect. Normal behavior. Normal judgment and  thought content.  Rest of physical exam deferred due to type of encounter  Imaging: DG Chest 1 View  Result Date: 07/08/2019 CLINICAL DATA:  Chest pain, history of COVID-19 positivity EXAM: CHEST  1 VIEW COMPARISON:  None. FINDINGS: The heart size and mediastinal contours are within normal limits. Both lungs are clear. The visualized skeletal structures are unremarkable. IMPRESSION: No active disease. Electronically Signed   By: Alcide Clever M.D.   On: 07/08/2019 11:42    Assessment and Plan:  Pregnancy: [id number] at [redacted]w[redacted]d 1. Supervision of low risk pregnancy, antepartum - Scheduling of IOL deferred due to symptomatic COVID infection and c/w patient preference - Plan for virtual visit next time, MAU PRN.  2. COVID-19 affecting pregnancy in third trimester - Previously connected with Post-COVID clinic - Discussed absence of significant amount of statistically significant data for pregnant and breastfeeding women but encouraged patient to pursue coordination of care for lung assessment, ongoing surveillance  3. History of herpes genitalis - Ongoing suppression  4. Positive GBS test - Tx in labor  Term labor symptoms and general obstetric precautions including but not limited to vaginal bleeding, contractions, leaking of fluid and fetal movement were reviewed in detail with the patient. I discussed the assessment and treatment plan with the patient. The patient was provided an opportunity to ask questions and all were answered. The patient agreed with the plan and demonstrated an understanding of the instructions. The patient was advised to call back or seek an in-person office evaluation/go to MAU at [facility] for any urgent or concerning symptoms. Please refer to After Visit Summary for other counseling recommendations.   I provided seven minutes of face-to-face time during this encounter.  No follow-ups on file.  Future Appointments  Date Time Provider [place]  07/18/2019  9:10 AM Chancy Milroy, MD [facility] [place], [place] for [employer], [place]

## 2019-07-10 NOTE — Progress Notes (Signed)
I connected with  Breanna [name] on 07/10/19 at  1:55 PM EDT by telephone and verified that I am speaking with the correct person using two identifiers.   I discussed the limitations, risks, security and privacy concerns of performing an evaluation and management service by telephone and the availability of in person appointments. I also discussed with the patient that there may be a patient responsible charge related to this service. The patient expressed understanding and agreed to proceed.  Henrietta Dine, CMA 07/10/2019  1:45 PM

## 2019-07-11 ENCOUNTER — Ambulatory Visit (INDEPENDENT_AMBULATORY_CARE_PROVIDER_SITE_OTHER): Payer: Medicaid Other | Admitting: Nurse Practitioner

## 2019-07-11 ENCOUNTER — Other Ambulatory Visit: Payer: Self-pay | Admitting: Lactation Services

## 2019-07-11 ENCOUNTER — Telehealth: Payer: Self-pay | Admitting: Nurse Practitioner

## 2019-07-11 DIAGNOSIS — O98513 Other viral diseases complicating pregnancy, third trimester: Secondary | ICD-10-CM

## 2019-07-11 DIAGNOSIS — U071 COVID-19: Secondary | ICD-10-CM

## 2019-07-11 MED ORDER — TERCONAZOLE 0.4 % VA CREA
1.0000 | TOPICAL_CREAM | Freq: Every day | VAGINAL | 0 refills | Status: DC
Start: 2019-07-11 — End: 2019-07-18

## 2019-07-11 NOTE — Progress Notes (Signed)
error 

## 2019-07-11 NOTE — Telephone Encounter (Signed)
Patient presented to [facility] center today for office visit. Patient sat down in the lobby and stated that she was too weak to walk to the exam room and that she had not ate or drank anything in a week. I advised patient that if she was that weak and considering she was pregnant that she would need to go directly to the ED. Patient was upset because she has been to the ED twice and sent home. She refused to be seen here today. Her husband picked her up and I explained to him that she needed to go directly to the ED. I called her OB/GYN and let them know how weak she was and my recommendations.

## 2019-07-11 NOTE — Progress Notes (Signed)
Terazol ordered for itching and vaginal discharge per standing order.

## 2019-07-18 ENCOUNTER — Telehealth (INDEPENDENT_AMBULATORY_CARE_PROVIDER_SITE_OTHER): Payer: Medicaid Other | Admitting: Obstetrics and Gynecology

## 2019-07-18 ENCOUNTER — Other Ambulatory Visit: Payer: Self-pay

## 2019-07-18 ENCOUNTER — Encounter: Payer: Self-pay | Admitting: Obstetrics and Gynecology

## 2019-07-18 DIAGNOSIS — Z3480 Encounter for supervision of other normal pregnancy, unspecified trimester: Secondary | ICD-10-CM

## 2019-07-18 DIAGNOSIS — B951 Streptococcus, group B, as the cause of diseases classified elsewhere: Secondary | ICD-10-CM

## 2019-07-18 DIAGNOSIS — O98513 Other viral diseases complicating pregnancy, third trimester: Secondary | ICD-10-CM

## 2019-07-18 DIAGNOSIS — O98313 Other infections with a predominantly sexual mode of transmission complicating pregnancy, third trimester: Secondary | ICD-10-CM

## 2019-07-18 DIAGNOSIS — O99820 Streptococcus B carrier state complicating pregnancy: Secondary | ICD-10-CM

## 2019-07-18 DIAGNOSIS — U071 COVID-19: Secondary | ICD-10-CM

## 2019-07-18 DIAGNOSIS — Z8619 Personal history of other infectious and parasitic diseases: Secondary | ICD-10-CM

## 2019-07-18 DIAGNOSIS — Z3A39 39 weeks gestation of pregnancy: Secondary | ICD-10-CM

## 2019-07-18 DIAGNOSIS — A6000 Herpesviral infection of urogenital system, unspecified: Secondary | ICD-10-CM

## 2019-07-18 MED ORDER — BLOOD PRESSURE KIT DEVI: 1.0000 | Freq: Once | 0 refills | Status: AC

## 2019-07-18 MED ORDER — BLOOD PRESSURE KIT DEVI: 1.0000 | Freq: Once | 0 refills | Status: DC

## 2019-07-18 NOTE — Progress Notes (Signed)
   OBSTETRICS PRENATAL VIRTUAL VISIT ENCOUNTER NOTE  Provider location: [facility]   I connected with Breanna [name] on 07/18/19 at  9:10 AM EDT by MyChart Video Encounter at home and verified that I am speaking with the correct person using two identifiers.   I discussed the limitations, risks, security and privacy concerns of performing an evaluation and management service virtually and the availability of in person appointments. I also discussed with the patient that there may be a patient responsible charge related to this service. The patient expressed understanding and agreed to proceed. Subjective:  Breanna [name] is a 34 y.o. [id number] at [redacted]w[redacted]d being seen today for ongoing prenatal care.  She is currently monitored for the following issues for this high-risk pregnancy and has Mental disorders of mother, antepartum; Abdominal cramping; Supervision of other normal pregnancy, antepartum; Fibromyalgia; PTSD (post-traumatic stress disorder); Anxiety; Depression; Herpes genitalia; History of herpes genitalis; Anemia in pregnancy; Positive GBS test; and COVID-19 affecting pregnancy in third trimester on their problem list.  Patient reports general discomforts of pregnancy. Denies SOB.  Contractions: Not present. Vag. Bleeding: None.  Movement: Present. Denies any leaking of fluid.   The following portions of the patient's history were reviewed and updated as appropriate: allergies, current medications, past family history, past medical history, past social history, past surgical history and problem list.   Objective:  There were no vitals filed for this visit.  Fetal Status:     Movement: Present     General:  Alert, oriented and cooperative. Patient is in no acute distress.  Respiratory: Normal respiratory effort, no problems with respiration noted  Mental Status: Normal mood and affect. Normal behavior. Normal judgment and thought content.  Rest  of physical exam deferred due to type of encounter  Imaging: DG Chest 1 View  Result Date: 07/08/2019 CLINICAL DATA:  Chest pain, history of COVID-19 positivity EXAM: CHEST  1 VIEW COMPARISON:  None. FINDINGS: The heart size and mediastinal contours are within normal limits. Both lungs are clear. The visualized skeletal structures are unremarkable. IMPRESSION: No active disease. Electronically Signed   By: Alcide Clever M.D.   On: 07/08/2019 11:42    Assessment and Plan:  Pregnancy: [id number] at [redacted]w[redacted]d 1. Positive GBS test Tx while in labor  2. History of herpes genitalis Continue with suppression  3. Supervision of other normal pregnancy, antepartum Stable Labor precautions BPP/NST this Friday d/t post dates and schedule IOL Pt will be 2 weeks out from + Covid test  4. COVID-19 affecting pregnancy in third trimester Stable No Sx  Term labor symptoms and general obstetric precautions including but not limited to vaginal bleeding, contractions, leaking of fluid and fetal movement were reviewed in detail with the patient. I discussed the assessment and treatment plan with the patient. The patient was provided an opportunity to ask questions and all were answered. The patient agreed with the plan and demonstrated an understanding of the instructions. The patient was advised to call back or seek an in-person office evaluation/go to MAU at [facility] for any urgent or concerning symptoms. Please refer to After Visit Summary for other counseling recommendations.   I provided 8 minutes of face-to-face time during this encounter.  No follow-ups on file.  No future appointments.  Hermina Staggers, MD Center for [facility], [facility]

## 2019-07-18 NOTE — Addendum Note (Signed)
Addended by: Maxwell Marion E on: 07/18/2019 02:06 PM   Modules accepted: Orders

## 2019-07-18 NOTE — Progress Notes (Signed)
I connected with  Breanna [name] on 07/18/19 at 0919 by telephone and verified that I am speaking with the correct person using two identifiers.  I discussed the limitations, risks, security and privacy concerns of performing an evaluation and management service by telephone and the availability of in person appointments. I also discussed with the patient that there may be a patient responsible charge related to this service. The patient expressed understanding and agreed to proceed.  Marjo Bicker, RN 07/18/2019  9:19 AM

## 2019-07-19 ENCOUNTER — Other Ambulatory Visit: Payer: Self-pay

## 2019-07-19 ENCOUNTER — Inpatient Hospital Stay (HOSPITAL_COMMUNITY): Payer: Medicaid Other | Admitting: Anesthesiology

## 2019-07-19 ENCOUNTER — Encounter (HOSPITAL_COMMUNITY): Payer: Self-pay | Admitting: Family Medicine

## 2019-07-19 ENCOUNTER — Inpatient Hospital Stay (HOSPITAL_COMMUNITY)
Admission: AD | Admit: 2019-07-19 | Discharge: 2019-07-21 | DRG: 805 | Disposition: A | Discharge status: Home / Self Care | Payer: Medicaid Other | Attending: Family Medicine | Admitting: Family Medicine

## 2019-07-19 DIAGNOSIS — A6000 Herpesviral infection of urogenital system, unspecified: Secondary | ICD-10-CM | POA: Diagnosis present

## 2019-07-19 DIAGNOSIS — O9852 Other viral diseases complicating childbirth: Secondary | ICD-10-CM | POA: Diagnosis present

## 2019-07-19 DIAGNOSIS — U071 COVID-19: Secondary | ICD-10-CM | POA: Diagnosis present

## 2019-07-19 DIAGNOSIS — F4310 Post-traumatic stress disorder, unspecified: Secondary | ICD-10-CM

## 2019-07-19 DIAGNOSIS — O9832 Other infections with a predominantly sexual mode of transmission complicating childbirth: Secondary | ICD-10-CM | POA: Diagnosis present

## 2019-07-19 DIAGNOSIS — B951 Streptococcus, group B, as the cause of diseases classified elsewhere: Secondary | ICD-10-CM

## 2019-07-19 DIAGNOSIS — O26893 Other specified pregnancy related conditions, third trimester: Secondary | ICD-10-CM | POA: Diagnosis present

## 2019-07-19 DIAGNOSIS — O99013 Anemia complicating pregnancy, third trimester: Secondary | ICD-10-CM

## 2019-07-19 DIAGNOSIS — Z3A40 40 weeks gestation of pregnancy: Secondary | ICD-10-CM

## 2019-07-19 DIAGNOSIS — Z8619 Personal history of other infectious and parasitic diseases: Secondary | ICD-10-CM

## 2019-07-19 DIAGNOSIS — Z3480 Encounter for supervision of other normal pregnancy, unspecified trimester: Secondary | ICD-10-CM

## 2019-07-19 DIAGNOSIS — F419 Anxiety disorder, unspecified: Secondary | ICD-10-CM

## 2019-07-19 DIAGNOSIS — O99019 Anemia complicating pregnancy, unspecified trimester: Secondary | ICD-10-CM | POA: Diagnosis present

## 2019-07-19 DIAGNOSIS — O98513 Other viral diseases complicating pregnancy, third trimester: Secondary | ICD-10-CM | POA: Diagnosis present

## 2019-07-19 DIAGNOSIS — R109 Unspecified abdominal pain: Secondary | ICD-10-CM

## 2019-07-19 DIAGNOSIS — Z87891 Personal history of nicotine dependence: Secondary | ICD-10-CM

## 2019-07-19 DIAGNOSIS — O99824 Streptococcus B carrier state complicating childbirth: Secondary | ICD-10-CM | POA: Diagnosis present

## 2019-07-19 LAB — CBC
HCT: 39.8 % (ref 36.0–46.0)
Hemoglobin: 13 g/dL (ref 12.0–15.0)
MCH: 27 pg (ref 26.0–34.0)
MCHC: 32.7 g/dL (ref 30.0–36.0)
MCV: 82.6 fL (ref 80.0–100.0)
Platelets: 322 10*3/uL (ref 150–400)
RBC: 4.82 MIL/uL (ref 3.87–5.11)
RDW: 23.4 % — ABNORMAL HIGH (ref 11.5–15.5)
WBC: 10 10*3/uL (ref 4.0–10.5)
nRBC: 0 % (ref 0.0–0.2)

## 2019-07-19 LAB — TYPE AND SCREEN
ABO/RH(D): B POS
Antibody Screen: NEGATIVE

## 2019-07-19 LAB — ABO/RH: ABO/RH(D): B POS

## 2019-07-19 LAB — RPR: RPR Ser Ql: NONREACTIVE

## 2019-07-19 MED ORDER — SIMETHICONE 80 MG PO CHEW: 80.0000 mg | CHEWABLE_TABLET | ORAL | Status: DC | PRN

## 2019-07-19 MED ORDER — WITCH HAZEL-GLYCERIN EX PADS: 1.0000 "application " | MEDICATED_PAD | CUTANEOUS | Status: DC | PRN

## 2019-07-19 MED ORDER — ONDANSETRON HCL 4 MG/2ML IJ SOLN: 4.0000 mg | INTRAMUSCULAR | Status: DC | PRN

## 2019-07-19 MED ORDER — HYDROCORTISONE 1 % EX CREA
TOPICAL_CREAM | Freq: Four times a day (QID) | CUTANEOUS | Status: DC
  Filled 2019-07-19: qty 28

## 2019-07-19 MED ORDER — ACETAMINOPHEN 325 MG PO TABS: 650.0000 mg | ORAL_TABLET | ORAL | Status: DC | PRN

## 2019-07-19 MED ORDER — IBUPROFEN 600 MG PO TABS: 600.0000 mg | ORAL_TABLET | Freq: Four times a day (QID) | ORAL | Status: DC

## 2019-07-19 MED ORDER — SOD CITRATE-CITRIC ACID 500-334 MG/5ML PO SOLN: 30.0000 mL | ORAL | Status: DC | PRN

## 2019-07-19 MED ORDER — OXYTOCIN BOLUS FROM INFUSION
500.0000 mL | Freq: Once | INTRAVENOUS | Status: AC
  Administered 2019-07-19: 500 mL via INTRAVENOUS

## 2019-07-19 MED ORDER — MORPHINE SULFATE (PF) 0.5 MG/ML IJ SOLN
INTRAMUSCULAR | Status: AC
  Filled 2019-07-19: qty 10

## 2019-07-19 MED ORDER — DIBUCAINE (PERIANAL) 1 % EX OINT: 1.0000 "application " | TOPICAL_OINTMENT | CUTANEOUS | Status: DC | PRN

## 2019-07-19 MED ORDER — OXYCODONE HCL 5 MG PO TABS: 5.0000 mg | ORAL_TABLET | ORAL | Status: DC | PRN

## 2019-07-19 MED ORDER — TERBUTALINE SULFATE 1 MG/ML IJ SOLN
0.2500 mg | Freq: Once | INTRAMUSCULAR | Status: AC
  Administered 2019-07-19: 0.25 mg via SUBCUTANEOUS

## 2019-07-19 MED ORDER — PENICILLIN G POT IN DEXTROSE 60000 UNIT/ML IV SOLN: 3.0000 10*6.[IU] | INTRAVENOUS | Status: DC

## 2019-07-19 MED ORDER — OXYCODONE-ACETAMINOPHEN 5-325 MG PO TABS: 2.0000 | ORAL_TABLET | ORAL | Status: DC | PRN

## 2019-07-19 MED ORDER — BENZOCAINE-MENTHOL 20-0.5 % EX AERO
1.0000 "application " | INHALATION_SPRAY | CUTANEOUS | Status: DC | PRN
  Administered 2019-07-19: 1 via TOPICAL
  Filled 2019-07-19: qty 56

## 2019-07-19 MED ORDER — OXYCODONE-ACETAMINOPHEN 5-325 MG PO TABS: 1.0000 | ORAL_TABLET | ORAL | Status: DC | PRN

## 2019-07-19 MED ORDER — LIDOCAINE-EPINEPHRINE (PF) 2 %-1:200000 IJ SOLN
INTRAMUSCULAR | Status: DC | PRN
  Administered 2019-07-19: 2 mL via EPIDURAL
  Administered 2019-07-19: 3 mL via EPIDURAL

## 2019-07-19 MED ORDER — LACTATED RINGERS AMNIOINFUSION: INTRAVENOUS | Status: DC

## 2019-07-19 MED ORDER — PHENYLEPHRINE 40 MCG/ML (10ML) SYRINGE FOR IV PUSH (FOR BLOOD PRESSURE SUPPORT)
80.0000 ug | PREFILLED_SYRINGE | INTRAVENOUS | Status: DC | PRN
  Administered 2019-07-19: 80 ug via INTRAVENOUS

## 2019-07-19 MED ORDER — ONDANSETRON HCL 4 MG PO TABS: 4.0000 mg | ORAL_TABLET | ORAL | Status: DC | PRN

## 2019-07-19 MED ORDER — EPHEDRINE 5 MG/ML INJ: 10.0000 mg | INTRAVENOUS | Status: DC | PRN

## 2019-07-19 MED ORDER — SODIUM CHLORIDE 0.9 % IV SOLN
2.0000 g | Freq: Once | INTRAVENOUS | Status: AC
  Administered 2019-07-19: 2 g via INTRAVENOUS
  Filled 2019-07-19: qty 2000

## 2019-07-19 MED ORDER — TERBUTALINE SULFATE 1 MG/ML IJ SOLN
INTRAMUSCULAR | Status: AC
  Filled 2019-07-19: qty 1

## 2019-07-19 MED ORDER — LACTATED RINGERS IV SOLN: 500.0000 mL | INTRAVENOUS | Status: DC | PRN

## 2019-07-19 MED ORDER — LACTATED RINGERS IV SOLN
500.0000 mL | Freq: Once | INTRAVENOUS | Status: AC
  Administered 2019-07-19: 500 mL via INTRAVENOUS

## 2019-07-19 MED ORDER — TETANUS-DIPHTH-ACELL PERTUSSIS 5-2.5-18.5 LF-MCG/0.5 IM SUSP: 0.5000 mL | Freq: Once | INTRAMUSCULAR | Status: DC

## 2019-07-19 MED ORDER — OXYTOCIN-SODIUM CHLORIDE 30-0.9 UT/500ML-% IV SOLN
INTRAVENOUS | Status: AC
  Filled 2019-07-19: qty 500

## 2019-07-19 MED ORDER — COCONUT OIL OIL
1.0000 "application " | TOPICAL_OIL | Status: DC | PRN
  Administered 2019-07-20: 1 via TOPICAL

## 2019-07-19 MED ORDER — HYDROCORTISONE 0.5 % EX CREA
TOPICAL_CREAM | Freq: Four times a day (QID) | CUTANEOUS | Status: DC
  Filled 2019-07-19: qty 28.35

## 2019-07-19 MED ORDER — FENTANYL-BUPIVACAINE-NACL 0.5-0.125-0.9 MG/250ML-% EP SOLN
12.0000 mL/h | EPIDURAL | Status: DC | PRN
  Filled 2019-07-19: qty 250

## 2019-07-19 MED ORDER — SODIUM CHLORIDE 0.9 % IV SOLN: 5.0000 10*6.[IU] | Freq: Once | INTRAVENOUS | Status: DC

## 2019-07-19 MED ORDER — PRENATAL MULTIVITAMIN CH: 1.0000 | ORAL_TABLET | Freq: Every day | ORAL | Status: DC

## 2019-07-19 MED ORDER — ONDANSETRON HCL 4 MG/2ML IJ SOLN
INTRAMUSCULAR | Status: AC
  Filled 2019-07-19: qty 2

## 2019-07-19 MED ORDER — FENTANYL CITRATE (PF) 100 MCG/2ML IJ SOLN
INTRAMUSCULAR | Status: AC
  Filled 2019-07-19: qty 2

## 2019-07-19 MED ORDER — FLEET ENEMA 7-19 GM/118ML RE ENEM: 1.0000 | ENEMA | RECTAL | Status: DC | PRN

## 2019-07-19 MED ORDER — SENNOSIDES-DOCUSATE SODIUM 8.6-50 MG PO TABS
2.0000 | ORAL_TABLET | ORAL | Status: DC
  Administered 2019-07-19 – 2019-07-20 (×2): 2 via ORAL
  Filled 2019-07-19 (×2): qty 2

## 2019-07-19 MED ORDER — COCONUT OIL OIL: 1.0000 "application " | TOPICAL_OIL | Status: DC | PRN

## 2019-07-19 MED ORDER — OXYTOCIN-SODIUM CHLORIDE 30-0.9 UT/500ML-% IV SOLN
2.5000 [IU]/h | INTRAVENOUS | Status: DC
  Administered 2019-07-19: 2.5 [IU]/h via INTRAVENOUS
  Filled 2019-07-19: qty 500

## 2019-07-19 MED ORDER — IBUPROFEN 600 MG PO TABS
600.0000 mg | ORAL_TABLET | Freq: Four times a day (QID) | ORAL | Status: DC
  Administered 2019-07-19 – 2019-07-21 (×8): 600 mg via ORAL
  Filled 2019-07-19 (×8): qty 1

## 2019-07-19 MED ORDER — DIPHENHYDRAMINE HCL 25 MG PO CAPS
25.0000 mg | ORAL_CAPSULE | Freq: Four times a day (QID) | ORAL | Status: DC | PRN
  Administered 2019-07-19: 25 mg via ORAL
  Filled 2019-07-19: qty 1

## 2019-07-19 MED ORDER — PHENYLEPHRINE 40 MCG/ML (10ML) SYRINGE FOR IV PUSH (FOR BLOOD PRESSURE SUPPORT)
80.0000 ug | PREFILLED_SYRINGE | INTRAVENOUS | Status: DC | PRN
  Filled 2019-07-19: qty 10

## 2019-07-19 MED ORDER — LACTATED RINGERS IV SOLN: INTRAVENOUS | Status: DC

## 2019-07-19 MED ORDER — OXYCODONE HCL 5 MG PO TABS: 10.0000 mg | ORAL_TABLET | ORAL | Status: DC | PRN

## 2019-07-19 MED ORDER — LIDOCAINE HCL (PF) 1 % IJ SOLN
INTRAMUSCULAR | Status: AC
  Filled 2019-07-19: qty 30

## 2019-07-19 MED ORDER — SENNOSIDES-DOCUSATE SODIUM 8.6-50 MG PO TABS: 2.0000 | ORAL_TABLET | ORAL | Status: DC

## 2019-07-19 MED ORDER — DIPHENHYDRAMINE HCL 25 MG PO CAPS: 25.0000 mg | ORAL_CAPSULE | Freq: Four times a day (QID) | ORAL | Status: DC | PRN

## 2019-07-19 MED ORDER — DIPHENHYDRAMINE HCL 50 MG/ML IJ SOLN: 12.5000 mg | INTRAMUSCULAR | Status: DC | PRN

## 2019-07-19 MED ORDER — ACETAMINOPHEN 325 MG PO TABS
650.0000 mg | ORAL_TABLET | ORAL | Status: DC | PRN
  Administered 2019-07-19 – 2019-07-20 (×2): 650 mg via ORAL
  Filled 2019-07-19 (×2): qty 2

## 2019-07-19 MED ORDER — SODIUM CHLORIDE (PF) 0.9 % IJ SOLN
INTRAMUSCULAR | Status: DC | PRN
  Administered 2019-07-19: 12 mL/h via EPIDURAL

## 2019-07-19 MED ORDER — MAGNESIUM HYDROXIDE 400 MG/5ML PO SUSP: 30.0000 mL | ORAL | Status: DC | PRN

## 2019-07-19 MED ORDER — BENZOCAINE-MENTHOL 20-0.5 % EX AERO: 1.0000 "application " | INHALATION_SPRAY | CUTANEOUS | Status: DC | PRN

## 2019-07-19 MED ORDER — PRENATAL MULTIVITAMIN CH
1.0000 | ORAL_TABLET | Freq: Every day | ORAL | Status: DC
  Administered 2019-07-19 – 2019-07-20 (×2): 1 via ORAL
  Filled 2019-07-19 (×2): qty 1

## 2019-07-19 MED ORDER — LIDOCAINE HCL (PF) 1 % IJ SOLN: 30.0000 mL | INTRAMUSCULAR | Status: DC | PRN

## 2019-07-19 MED ORDER — ONDANSETRON HCL 4 MG/2ML IJ SOLN: 4.0000 mg | Freq: Four times a day (QID) | INTRAMUSCULAR | Status: DC | PRN

## 2019-07-19 NOTE — H&P (Addendum)
OBSTETRIC ADMISSION HISTORY AND PHYSICAL  Breanna [name] is a 34 y.o. female [phone] with IUP at [redacted]w[redacted]d by 6wk  [place] presenting for contractions. She reports +FMs, No LOF, no VB, no blurry vision, headaches or peripheral edema, and RUQ pain.  She plans on breast feeding. She requests non-hormonal options but has not decided on which specific method for birth control. She received her prenatal care at [place]: By 6 wk [place] --->  Estimated Date of Delivery: 07/19/19  Sono:   @[redacted]w[redacted]d , CWD,  Anatomy scan with echogenic focus in left ventricle, breech presentation,  anterior placenta, 285g, 64% EFW   Prenatal History/Complications: -Anxiety  -MDD -Hx of herpes genitalis (suppression therapy received) -Anemia of Pregnancy - GBS positive  Past Medical History: Past Medical History:  Diagnosis Date  . Anemia   . Anxiety   . BV (bacterial vaginosis)   . Depression   . Herpes genitalia   . History of postpartum depression, currently pregnant   . PTSD (post-traumatic stress disorder)   . UTI (lower urinary tract infection)     Past Surgical History: Past Surgical History:  Procedure Laterality Date  . APPENDECTOMY  2018  . MOUTH SURGERY    . RHINOPLASTY      Obstetrical History: OB History    Gravida  6   Para  3   Term  3   Preterm  0   AB  2   Living  3     SAB  1   TAB  1   Ectopic  0   Multiple  0   Live Births  3           Social History: Social History   Socioeconomic History  . Marital status: Single    Spouse name: Not on file  . Number of children: Not on file  . Years of education: Not on file  . Highest education level: Not on file  Occupational History  . Not on file  Tobacco Use  . Smoking status: Former Smoker    Types: Cigarettes    Quit date: 02/17/2012    Years since quitting: 7.4  . Smokeless tobacco: Never Used  Substance and Sexual Activity  . Alcohol use: Not Currently    Comment: ocasionally  . Drug use: No  . Sexual  activity: Not Currently    Birth control/protection: None  Other Topics Concern  . Not on file  Social History Narrative  . Not on file   Social Determinants of Health   Financial Resource Strain:   . Difficulty of Paying Living Expenses:   Food Insecurity: No Food Insecurity  . Worried About Charity fundraiser in the Last Year: Never true  . Ran Out of Food in the Last Year: Never true  Transportation Needs: No Transportation Needs  . Lack of Transportation (Medical): No  . Lack of Transportation (Non-Medical): No  Physical Activity:   . Days of Exercise per Week:   . Minutes of Exercise per Session:   Stress:   . Feeling of Stress :   Social Connections:   . Frequency of Communication with Friends and Family:   . Frequency of Social Gatherings with Friends and Family:   . Attends Religious Services:   . Active Member of Clubs or Organizations:   . Attends Archivist Meetings:   Marland Kitchen Marital Status:     Family History: Family History  Problem Relation Age of Onset  . Thyroid disease Maternal  Grandmother   . Bipolar disorder Maternal Grandmother   . Diabetes Paternal Grandmother   . Heart disease Paternal Grandmother   . Hypertension Paternal Grandmother   . Alcohol abuse Neg Hx   . Arthritis Neg Hx   . Asthma Neg Hx   . Birth defects Neg Hx   . Cancer Neg Hx   . COPD Neg Hx   . Depression Neg Hx   . Drug abuse Neg Hx   . Early death Neg Hx   . Hearing loss Neg Hx   . Hyperlipidemia Neg Hx   . Learning disabilities Neg Hx   . Kidney disease Neg Hx   . Mental illness Neg Hx   . Mental retardation Neg Hx   . Miscarriages / Stillbirths Neg Hx   . Stroke Neg Hx   . Vision loss Neg Hx     Allergies: Allergies  Allergen Reactions  . Latex Itching    Vaginal irritation  . Tape Rash    Medications Prior to Admission  Medication Sig Dispense Refill Last Dose  . Accu-Chek Softclix Lancets lancets Use as instructed. 100 each 12 Past Month at Unknown  time  . Elastic Bandages & Supports (COMFORT FIT MATERNITY SUPP MED) MISC 1 Device by Does not apply route daily. 1 each 0 Past Month at Unknown time  . ELDERBERRY PO Take by mouth.   07/18/2019 at Unknown time  . ferrous gluconate (FERGON) 324 MG tablet Take 1 tablet (324 mg total) by mouth every other day. 15 tablet 3 Past Month at Unknown time  . glucose blood (ACCU-CHEK GUIDE) test strip Use as instructed 100 each 12 Past Month at Unknown time  . Prenatal MV & Min w/FA-DHA (PRENATAL ADULT GUMMY/DHA/FA PO) Take by mouth.   07/18/2019 at Unknown time  . valACYclovir (VALTREX) 1000 MG tablet Take 1 tablet (1,000 mg total) by mouth daily. 30 tablet 1 07/18/2019 at Unknown time    Review of Systems   All systems reviewed and negative except as stated in HPI  Blood pressure 126/77, pulse 87, temperature 97.8 F (36.6 C), temperature source Oral, last menstrual period 10/12/2018, unknown if currently breastfeeding. General appearance: alert, cooperative, appears stated age and mild distress Lungs: normal effort Heart: regular rate  Abdomen: soft, non-tender; bowel sounds normal Pelvic: gravid uterus GU: No vaginal lesions  Extremities: Homans sign is negative, no sign of DVT Presentation: cephalic   Fetal monitoring Baseline 125 +accelerations  +early and variable decels Category II  Dilation: 5.5 Effacement (%): 90 Station: 0 Exam by:: Dr. Crissie Reese  Prenatal labs: ABO, Rh: --/--/B POS, B POS Performed at [facility], [address] [place], [place] [address]  [phone]) Antibody: NEG (06/02 0325) Rubella: 12.60 (03/18 1406) RPR: Non Reactive (03/18 1406)  HBsAg: Negative (03/18 1406)  HIV: Non Reactive (03/18 1406)  GBS: Positive/-- (05/10 1508)  2 hr Glucola declined GTT, completed home CBG monitoring for 2 weeks  Genetic screening  MaterniT21 negative  Anatomy US normal   Prenatal Transfer Tool  Maternal Diabetes: No Genetic Screening: Normal Maternal  Ultrasounds/Referrals: Normal and Isolated EIF (echogenic intracardiac focus) Fetal Ultrasounds or other Referrals:  Other: cell free DNA screening completed and normal  Maternal Substance Abuse:  No Significant Maternal Medications:  None Significant Maternal Lab Results: Group B Strep positive and Other:  COVID +  Results for orders placed or performed during the hospital encounter of 07/19/19 (from the past 24 hour(s))  Type and screen  Collection Time: 07/19/19  3:25 AM  Result Value Ref Range   ABO/RH(D) B POS    Antibody Screen NEG    Sample Expiration      07/22/2019,2359 Performed at [facility], [address], [place], [place] [address]   ABO/Rh   Collection Time: 07/19/19  3:25 AM  Result Value Ref Range   ABO/RH(D)      B POS Performed at [facility], [address] [place], [place] [address]   CBC   Collection Time: 07/19/19  3:55 AM  Result Value Ref Range   WBC 10.0 4.0 - 10.5 K/uL   RBC 4.82 3.87 - 5.11 MIL/uL   Hemoglobin 13.0 12.0 - 15.0 g/dL   HCT 31.5 94.5 - 85.9 %   MCV 82.6 80.0 - 100.0 fL   MCH 27.0 26.0 - 34.0 pg   MCHC 32.7 30.0 - 36.0 g/dL   RDW 29.2 (H) 44.6 - 28.6 %   Platelets 322 150 - 400 K/uL   nRBC 0.0 0.0 - 0.2 %    Patient Active Problem List   Diagnosis Date Noted  . Normal labor 07/19/2019  . COVID-19 affecting pregnancy in third trimester 07/08/2019  . Positive GBS test 07/03/2019  . Anemia in pregnancy 05/15/2019  . History of herpes genitalis 05/04/2019  . Supervision of other normal pregnancy, antepartum 12/29/2018  . PTSD (post-traumatic stress disorder)   . Anxiety   . Depression   . Herpes genitalia   . Abdominal cramping 11/11/2018  . Fibromyalgia 04/09/2016  . Mental disorders of mother, antepartum 09/13/2012    Assessment/Plan:  SHANAN FITZPATRICK is a 34 y.o. [id number] at [redacted]w[redacted]d here for SOL with contractions.   #Labor: initially on entry to room with prolonged decel, still only 5 cm so given  terbutaline to allow infant to recover and allow epidural placement with good recovery. Expectant management until completes GBS treatment, anticipate SVD  #Pain: Epidural  #FWB: Category II; reassuring for moderate variability and accels #ID:  GBS +, Ampicillin ordered with penicillin to follow #MOF: breast  #MOC: undecided but would like nonhormonal  #Circ:  Undecided #Anxiety/Depression: plans to restart medications after delivery and see Asher Muir #Hx of Herpes Genitalis: received suppressive therapy since [redacted] weeks GA, on exam no lesions seen  #Anemia of Pregnancy: resolved, hgb 13 on admission  #COVID-19 Infection in third trimester: Tested positive on 07/07/19, stable, past 10 day infectious window, precautions discontinued   [name], MD Family Medicine, PGY-1 07/19/2019, 4:48 AM    OB FELLOW ATTESTATION  I have seen and examined this patient and edited the above documentation in the resident's note to reflect any changes or updates.  Zack Seal, MD/[facility] OB Fellow  07/19/2019, 5:13 AM

## 2019-07-19 NOTE — Anesthesia Preprocedure Evaluation (Signed)
Anesthesia Evaluation    Reviewed: Allergy & Precautions, Patient's Chart, lab work & pertinent test results  Airway Mallampati: II  TM Distance: >3 FB Neck ROM: Full    Dental no notable dental hx.    Pulmonary neg pulmonary ROS, former smoker,  COVID positive 07/07/19, presented with fever and cough, now asymptomatic   Pulmonary exam normal breath sounds clear to auscultation       Cardiovascular negative cardio ROS Normal cardiovascular exam Rhythm:Regular Rate:Normal     Neuro/Psych PSYCHIATRIC DISORDERS Anxiety Depression negative neurological ROS     GI/Hepatic negative GI ROS, Neg liver ROS,   Endo/Other  negative endocrine ROS  Renal/GU negative Renal ROS  negative genitourinary   Musculoskeletal negative musculoskeletal ROS (+)   Abdominal   Peds  Hematology negative hematology ROS (+)   Anesthesia Other Findings   Reproductive/Obstetrics (+) Pregnancy                             Anesthesia Physical Anesthesia Plan  ASA: III  Anesthesia Plan: Epidural   Post-op Pain Management:    Induction:   PONV Risk Score and Plan: Treatment may vary due to age or medical condition  Airway Management Planned: Natural Airway  Additional Equipment:   Intra-op Plan:   Post-operative Plan:   Informed Consent: I have reviewed the patients History and Physical, chart, labs and discussed the procedure including the risks, benefits and alternatives for the proposed anesthesia with the patient or authorized representative who has indicated his/her understanding and acceptance.       Plan Discussed with: Anesthesiologist  Anesthesia Plan Comments: (Patient identified. Risks, benefits, options discussed with patient including but not limited to bleeding, infection, nerve damage, paralysis, failed block, incomplete pain control, headache, blood pressure changes, nausea, vomiting,  reactions to medication, itching, and post partum back pain. Confirmed with bedside nurse the patient's most recent platelet count. Confirmed with the patient that they are not taking any anticoagulation, have any bleeding history or any family history of bleeding disorders. Patient expressed understanding and wishes to proceed. All questions were answered. )        Anesthesia Quick Evaluation

## 2019-07-19 NOTE — Plan of Care (Signed)
L&D careplan completed 

## 2019-07-19 NOTE — Discharge Summary (Signed)
Postpartum Discharge Summary       Patient Name: Breanna [name] DOB: 1985/04/24 MRN: [id number]  Date of admission: 07/19/2019 Delivery date:07/19/2019  Delivering provider: Clarnce Flock  Date of discharge: 07/21/2019  Admitting diagnosis: Normal labor [O80, Z37.9] Intrauterine pregnancy: [redacted]w[redacted]d    Secondary diagnosis:  Active Problems:   Supervision of other normal pregnancy, antepartum   Herpes genitalia   Anemia in pregnancy   Positive GBS test   COVID-19 affecting pregnancy in third trimester   NSVD (normal spontaneous vaginal delivery)  Additional problems: Hx depression/MDD    Discharge diagnosis: Term Pregnancy Delivered                                              Post partum procedures:none Augmentation: N/A Complications: None  Hospital course: Onset of Labor With Vaginal Delivery      34y.o. yo [id number] admitted in Latent Labor on 07/19/2019. Patient had an uncomplicated labor course as follows: arrived at 3 cm and progressed spontaneously to fully dilated.  Membrane Rupture Time/Date: 5:15 AM ,07/19/2019   Delivery Method:Vaginal, Spontaneous  Episiotomy: None  Lacerations:  Periurethral  Patient had an uncomplicated postpartum course.  She is ambulating, tolerating a regular diet, passing flatus, and urinating well. Patient is discharged home in stable condition on 07/21/19.  Newborn Data: Birth date:07/19/2019  Birth time:6:20 AM  Gender:Female  Living status:Living  Apgars:8 ,9  Weight:3155 g   Magnesium Sulfate received: No BMZ received: No Rhophylac:N/A MMR:N/A T-DaP:Declined Flu: Declined Transfusion:No  Physical exam  Vitals:   07/20/19 0526 07/20/19 1507 07/20/19 2100 07/21/19 0505  BP: 106/60 111/60 113/79 98/63  Pulse: 67 76 81 63  Resp: _0 Temp: 97.9 F (36.6 C) 98 F (36.7 C) 98.1 F (36.7 C) 98 F (36.7 C)  TempSrc: Oral Oral Oral Oral  SpO2: 99% 99%    Weight:      Height:       General: alert,  cooperative and no distress Lochia: appropriate Uterine Fundus: firm Incision: N/A DVT Evaluation: No evidence of DVT seen on physical exam. Negative Homan's sign. No cords or calf tenderness. Labs: Lab Results  Component Value Date   WBC 10.0 07/19/2019   HGB 13.0 07/19/2019   HCT 39.8 07/19/2019   MCV 82.6 07/19/2019   PLT 322 07/19/2019   CMP Latest Ref Rng & Units 06/27/2015  Glucose 65 - 99 mg/dL 79  BUN 6 - 20 mg/dL 4(L)  Creatinine 0.44 - 1.00 mg/dL 0.70  Sodium 135 - 145 mmol/L 142  Potassium 3.5 - 5.1 mmol/L 3.4(L)  Chloride 101 - 111 mmol/L 103  CO2 19 - 32 mEq/L -  Calcium 8.4 - 10.5 mg/dL -  Total Protein 6.0 - 8.3 g/dL -  Total Bilirubin 0.3 - 1.2 mg/dL -  Alkaline Phos 39 - 117 U/L -  AST 0 - 37 U/L -  ALT 0 - 35 U/L -   Edinburgh Score: Edinburgh Postnatal Depression Scale Screening Tool 07/20/2019  I have been able to laugh and see the funny side of things. 0  I have looked forward with enjoyment to things. 2  I have blamed myself unnecessarily when things went wrong. 2  I have been anxious or worried for no good reason. 3  I have felt scared or panicky for no good reason. 3  Things have been getting on top of me. 3  I have been so unhappy that I have had difficulty sleeping. 1  I have felt sad or miserable. 1  I have been so unhappy that I have been crying. 1  The thought of harming myself has occurred to me. 0  Edinburgh Postnatal Depression Scale Total 16     After visit meds:  Allergies as of 07/21/2019      Reactions   Latex Itching   Vaginal irritation   Tape Rash      Medication List    STOP taking these medications   Accu-Chek Guide test strip Generic drug: glucose blood   Accu-Chek Softclix Lancets lancets   Comfort Fit Maternity Supp Med Misc   ELDERBERRY PO   ferrous gluconate 324 MG tablet Commonly known as: FERGON   valACYclovir 1000 MG tablet Commonly known as: Valtrex     TAKE these medications   ibuprofen 600 MG  tablet Commonly known as: ADVIL Take 1 tablet (600 mg total) by mouth every 6 (six) hours.   PRENATAL ADULT GUMMY/DHA/FA PO Take 1 tablet by mouth daily.        Discharge home in stable condition Infant Feeding: Breast Infant Disposition:home with mother Discharge instruction: per After Visit Summary and Postpartum booklet. Activity: Advance as tolerated. Pelvic rest for 6 weeks.  Diet: routine diet Future Appointments: Future Appointments  Date Time Provider [place]  08/02/2019  2:15 PM [facility]  08/23/2019  8:15 AM Luvenia Redden, PA-C [facility]   Follow up Visit: Follow-up Information    [facility] Follow up on 08/02/2019.   Why: for mood check (can be virtual) Contact information: [place]           Please schedule this patient for a In person postpartum visit in 6 weeks with the following provider: Any provider. Additional Postpartum F/U:Postpartum Depression checkup  Low risk pregnancy complicated by: COVID infection 07/07/2019 Delivery mode:  Vaginal, Spontaneous  Anticipated Birth Control:  Unsure   07/21/2019 Christin Fudge, CNM

## 2019-07-19 NOTE — MAU Note (Signed)
Pt here with reports of contractions every 6-10 mins since 10pm. Pt denies LOF or vaginal bleeding. Reports good fetal movement. Cervix was 1cm about 4 weeks ago.

## 2019-07-19 NOTE — Anesthesia Postprocedure Evaluation (Signed)
Anesthesia Post Note  Patient: Breanna [name]  Procedure(s) Performed: AN AD HOC LABOR EPIDURAL     Patient location during evaluation: Mother Baby Anesthesia Type: Epidural Level of consciousness: awake and alert Pain management: pain level controlled Vital Signs Assessment: post-procedure vital signs reviewed and stable Respiratory status: spontaneous breathing, nonlabored ventilation and respiratory function stable Cardiovascular status: stable Postop Assessment: no headache, no backache and epidural receding Anesthetic complications: no    Last Vitals:  Vitals:   07/19/19 0900 07/19/19 1000  BP: 104/66 104/65  Pulse: 64 (!) 59  Resp: 18 17  Temp: 36.7 C 36.7 C  SpO2:      Last Pain:  Vitals:   07/19/19 1100  TempSrc:   PainSc: 0-No pain   Pain Goal:                   Sharolyn Weber

## 2019-07-19 NOTE — Anesthesia Procedure Notes (Signed)
Epidural Patient location during procedure: OB Start time: 07/19/2019 4:35 AM End time: 07/19/2019 4:50 AM  Staffing Anesthesiologist: Elmer Picker, MD Performed: anesthesiologist   Preanesthetic Checklist Completed: patient identified, IV checked, risks and benefits discussed, monitors and equipment checked, pre-op evaluation and timeout performed  Epidural Patient position: sitting Prep: DuraPrep and site prepped and draped Patient monitoring: continuous pulse ox, blood pressure, heart rate and cardiac monitor Approach: midline Location: L3-L4 Injection technique: LOR air  Needle:  Needle type: Tuohy  Needle gauge: 17 G Needle length: 9 cm Needle insertion depth: 4 cm Catheter type: closed end flexible Catheter size: 19 Gauge Catheter at skin depth: 10 cm Test dose: negative  Assessment Sensory level: T8 Events: blood not aspirated, injection not painful, no injection resistance, no paresthesia and negative IV test  Additional Notes Patient identified. Risks/Benefits/Options discussed with patient including but not limited to bleeding, infection, nerve damage, paralysis, failed block, incomplete pain control, headache, blood pressure changes, nausea, vomiting, reactions to medication both or allergic, itching and postpartum back pain. Confirmed with bedside nurse the patient's most recent platelet count. Confirmed with patient that they are not currently taking any anticoagulation, have any bleeding history or any family history of bleeding disorders. Patient expressed understanding and wished to proceed. All questions were answered. Sterile technique was used throughout the entire procedure. Please see nursing notes for vital signs. Test dose was given through epidural catheter and negative prior to continuing to dose epidural or start infusion. Warning signs of high block given to the patient including shortness of breath, tingling/numbness in hands, complete motor block, or  any concerning symptoms with instructions to call for help. Patient was given instructions on fall risk and not to get out of bed. All questions and concerns addressed with instructions to call with any issues or inadequate analgesia.  Reason for block:procedure for pain

## 2019-07-19 NOTE — OB Triage Provider Note (Signed)
Breanna [name]  [id number] Aug 09, 1985  Patient with positive COVID 19 result on Jul 07, 2019 through [facility].  Provider to bedside and patient endorses positive result.  Informed that due to active infection she is no longer a candidate for waterbirth.  Patient appropriately upset by this information.  Informed that she could labor in shower for water therapy and patient declines.  She requests epidural for pain management.   Cherre Robins MSN, CNM Advanced Practice Provider, Center for [employer]

## 2019-07-20 NOTE — Progress Notes (Signed)
CSW has made three attempts to speak with [name].  CSW reported to gentleman in room that CSW would  try back around 2pm to speak with [name].      Avi Archuleta S. Tilak Oakley, MSW, LCSW [facility] [place] [phone]   

## 2019-07-20 NOTE — Progress Notes (Signed)
Post Partum Day 1 Subjective: no complaints, up ad lib, voiding and tolerating PO.  Lochia slightly > menses.  Objective: Blood pressure 106/60, pulse 67, temperature 97.9 F (36.6 C), temperature source Oral, resp. rate 18, height 5\' 4"  (1.626 m), weight 69.4 kg, last menstrual period 10/12/2018, SpO2 99 %, unknown if currently breastfeeding.  Physical Exam:  General: alert, cooperative and no distress Lochia: appropriate Uterine Fundus: firm DVT Evaluation: No evidence of DVT seen on physical exam.  Recent Labs    07/19/19 0355  HGB 13.0  HCT 39.8    Assessment/Plan: Plan for discharge tomorrow, Breastfeeding and Contraception undecided.  Discharge tomorrow due to GBS+ with inadequate treatment.   LOS: 1 day   EMILY 09/18/19, MD PGY-2 Resident Family Medicine 07/20/2019, 8:01 AM   I saw and evaluated the patient. I agree with the findings and the plan of care as documented in the resident's note. Possible DC today per Peds. Still undecided about contraception. Reports mood is okay, has appt on 6/16 with behavioral health. Has previously been on medications, declines currently.   7/16, MD [facility] Family Medicine Fellow, [facility] for [facility]

## 2019-07-20 NOTE — Progress Notes (Signed)
CSW received consult for hx of Anxiety, PTSD  and Depression.  CSW met with [name] to offer support and complete assessment.    CSW spoke with [name] via phone. CSW congratulated [name] and FOB on the birth of infant. CSW advised [name] of CSWs' role and the reason for CSW calling to speak with her. [name] reported that she was diagnosed with anxiety and depression at the age of 75. [name] reports previous medication use but reports that she didn't take anything during this pregancy. [name] reported no desire to be on medication at this time as she is seeing a therapist with [facility] that has been very helpful for her. [name] reported that she has an appointment on June 16th with [facility] for follow up. CSW understanding of this and inquired from [name] on any other mental health hx. 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 [name] reports that she has no other mental health hx. [name] denies SI, H, DV. [name] reports that she had all needed items to care for infant with no other needs.   CSW inquired from [facility] on her score of 16 on Edinburgh. [name] reports that she had COVID and was couch bound which made things stressful for her. [name] reported feeling better and having supports from her family. CSW advised [name] that CSW has given staff PPD Checklist to give to her to help mange further feelings as they may relate to PPD. [name] thanked CSW and reported no other needs at this time.   CSW provided education regarding the baby blues period vs. perinatal mood disorders, discussed treatment and gave resources for mental health follow up if concerns arise.  CSW recommends self-evaluation during the postpartum time period using the New Mom Checklist from Postpartum Progress and encouraged [name] to contact a medical professional if symptoms are noted at any time.   CSW provided review of Sudden Infant Death Syndrome (SIDS) precautions.   CSW identifies no further need for intervention and no barriers to discharge at this time.   Breanna [name]  Breanna [name], MSW, LCSW [facility] [place] [facility] [place] [phone]

## 2019-07-20 NOTE — BH Specialist Note (Signed)
Integrated Behavioral Health via Telemedicine Video Visit  07/20/2019 MARNETTE PERKINS [id number]  Number of Integrated Behavioral Health visits: 5 Session Start time: 2:15  Session End time: 2:33 Total time: 18  Referring Provider: Luna Kitchens, CNM Type of Visit: Video Patient/Family location: Home [facility] Provider location: [facility]  All persons participating in visit: Patient Breanna [name] and [facility] Devaney Segers    Confirmed patient's address: Yes  Confirmed patient's phone number: Yes  Any changes to demographics: No   Confirmed patient's insurance: Yes  Any changes to patient's insurance: No   Discussed confidentiality: at previous visit  I connected with [name]  by a video enabled telemedicine application and verified that I am speaking with the correct person using two identifiers.     I discussed the limitations of evaluation and management by telemedicine and the availability of in person appointments.  I discussed that the purpose of this visit is to provide behavioral health care while limiting exposure to the novel coronavirus.   Discussed there is a possibility of technology failure and discussed alternative modes of communication if that failure occurs.  I discussed that engaging in this video visit, they consent to the provision of behavioral healthcare and the services will be billed under their insurance.  Patient and/or legal guardian expressed understanding and consented to video visit: Yes   PRESENTING CONCERNS: Patient and/or family reports the following symptoms/concerns: Pt states her primary concern today is escalating depressive/anxious symptoms postpartum, with lack of appetite, fatigue, lack of concentration, fidgety, anxious, worry, irritability, dread; requests to start Zoloft, as she has taken in the past postpartum with success.  Duration of problem: Increase postpartum; Severity  of problem: severe  STRENGTHS (Protective Factors/Coping Skills): Good social support  GOALS ADDRESSED: Patient will: 1.  Reduce symptoms of: anxiety and depression  2.  Demonstrate ability to: Increase healthy adjustment to current life circumstances and Increase adequate support systems for patient/family  INTERVENTIONS: Interventions utilized:  Psychoeducation and/or Health Education and Link to [employer] Standardized Assessments completed: GAD-7 and PHQ 9  ASSESSMENT: Patient currently experiencing Major depressive disorder, recurrent, severe, without psychotic features    Patient may benefit from continued psychoeducation and brief therapeutic interventions regarding coping with symptoms of depression and anxiety .  PLAN: 1. Follow up with behavioral health clinician on : Two weeks. Call Arnet Hofferber at [phone] as needed prior to scheduled visit. Asher Muir will call if medical provider sends [facility] medication to pharmacy.  2. Behavioral recommendations:  -Continue taking prenatal vitamin until postpartum visit (discuss with medical provider at that visit) -Continue to prioritize sleep when baby sleeps -Consider registering for and attending new mom support group at either [email or web address] or [email or web address] 3. Referral(s): Integrated [employer] (In Clinic)  I discussed the assessment and treatment plan with the patient and/or parent/guardian. They were provided an opportunity to ask questions and all were answered. They agreed with the plan and demonstrated an understanding of the instructions.   They were advised to call back or seek an in-person evaluation if the symptoms worsen or if the condition fails to improve as anticipated.  [name] [facility]  Depression screen [facility] 2/9 08/02/2019 07/18/2019 07/10/2019 06/26/2019 06/14/2019  Decreased Interest 1 1 0 3 2  Down, Depressed, Hopeless 0 0 1 3 2   PHQ - 2 Score 1 1 1 6 4   Altered sleeping 1 0 1 3 2    Tired, decreased energy 3 1 1  3  3  Change in appetite 3 0 1 0 0  Feeling bad or failure about yourself  0 0 1 2 0  Trouble concentrating 3 1 1 3 3   Moving slowly or fidgety/restless 3 0 1 3 3   Suicidal thoughts 0 0 0 0 0  PHQ-9 Score 14 3 7 20 15   Some recent data might be hidden   GAD 7 : Generalized Anxiety Score 08/02/2019 07/18/2019 07/10/2019 06/26/2019  Nervous, Anxious, on Edge 3 3 1 3   Control/stop worrying 3 3 1 3   Worry too much - different things 3 3 1 3   Trouble relaxing 3 3 1 3   Restless 1 1 1 3   Easily annoyed or irritable 3 3 1 3   Afraid - awful might happen 3 3 1 3   Total GAD 7 Score 19 19 7  21

## 2019-07-20 NOTE — Discharge Instructions (Signed)

## 2019-07-21 ENCOUNTER — Encounter: Payer: Medicaid Other | Admitting: Medical

## 2019-07-21 MED ORDER — IBUPROFEN 600 MG PO TABS: 600.0000 mg | ORAL_TABLET | Freq: Four times a day (QID) | ORAL | 0 refills | Status: DC

## 2019-07-23 ENCOUNTER — Telehealth (HOSPITAL_COMMUNITY): Payer: Self-pay | Admitting: Lactation Services

## 2019-07-23 NOTE — Telephone Encounter (Signed)
Mother called and left a message: concerned that baby may have a tongue tie and when she breast feeds "it hurts."  She stated that no one saw her during her admission in the hospital.  Phoned her back and suggested she make an OP visit.  Discussed ways to alleviate pain.  Referral has been placed for an ENT consult per mother.  She will follow up tomorrow.

## 2019-08-02 ENCOUNTER — Other Ambulatory Visit: Payer: Self-pay

## 2019-08-02 ENCOUNTER — Ambulatory Visit (INDEPENDENT_AMBULATORY_CARE_PROVIDER_SITE_OTHER): Payer: Medicaid Other | Admitting: Clinical

## 2019-08-02 DIAGNOSIS — F332 Major depressive disorder, recurrent severe without psychotic features: Secondary | ICD-10-CM

## 2019-08-02 NOTE — Patient Instructions (Signed)
Online new mom support groups:    [email or web address] (Mom Talk)   [email or web address]

## 2019-08-03 ENCOUNTER — Other Ambulatory Visit: Payer: Self-pay | Admitting: Medical

## 2019-08-03 ENCOUNTER — Telehealth: Payer: Self-pay | Admitting: Medical

## 2019-08-03 DIAGNOSIS — F53 Postpartum depression: Secondary | ICD-10-CM

## 2019-08-03 MED ORDER — SERTRALINE HCL 50 MG PO TABS: 50.0000 mg | ORAL_TABLET | Freq: Every day | ORAL | 2 refills | Status: DC

## 2019-08-03 NOTE — Telephone Encounter (Signed)
Breanna [name] had IBHC visit with [facility]. It was recommended that patient restart Zoloft. Rx sent. Patient informed Rx is at pharmacy of choice. Advised to start 25mg  x 1-2 weeks and then take full dose. Patient will follow-up as planned with [facility] on 7/1 and for routine PP on 7/7. Patient encouraged to reach out my phone or MyChart with questions.   9/7, PA-C 08/03/2019 2:24 PM

## 2019-08-08 NOTE — BH Specialist Note (Signed)
Integrated Behavioral Health via Telemedicine Phone  (Caregility) Visit  08/08/2019 [name] [id number]  Number of Integrated Behavioral Health visits: 6  Session Start time: 10:16  Session End time: 10:21 Total time: 5  Referring Provider: Luna Kitchens, CNM Type of Visit: Phone Patient/Family location: Home [facility] Provider location: [facility]  All persons participating in visit: Patient Breanna [name] and [facility] Marshell Rieger    Confirmed patient's address: Yes  Confirmed patient's phone number: Yes  Any changes to demographics: No   Confirmed patient's insurance: Yes  Any changes to patient's insurance: No   Discussed confidentiality: at previous visit  I connected with [name]  by a video enabled telemedicine application (Caregility) and verified that I am speaking with the correct person using two identifiers.     I discussed the limitations of evaluation and management by telemedicine and the availability of in person appointments.  I discussed that the purpose of this visit is to provide behavioral health care while limiting exposure to the novel coronavirus.   Discussed there is a possibility of technology failure and discussed alternative modes of communication if that failure occurs.  I discussed that engaging in this virtual visit, they consent to the provision of behavioral healthcare and the services will be billed under their insurance.  Patient and/or legal guardian expressed understanding and consented to virtual visit: Yes   PRESENTING CONCERNS: Patient and/or family reports the following symptoms/concerns: Pt states she has not started taking Zoloft yet, as family is in town, and will begin taking this week.  Duration of problem: Ongoing; Severity of problem: moderately severe  STRENGTHS (Protective Factors/Coping Skills): Good social support  GOALS ADDRESSED: Patient will: 1.  Reduce  symptoms of: anxiety and depression   INTERVENTIONS: Interventions utilized:  Supportive Counseling Standardized Assessments completed: Not given today  ASSESSMENT: Patient currently experiencing Major depressive disorder, severe, recurrent, without psychotic features.   Patient may benefit from continued psychoeducation and brief therapeutic interventions regarding coping with symptoms of depression, anxiety .  PLAN: 1. Follow up with behavioral health clinician on : Two weeks 2. Behavioral recommendations:  -Begin taking Zoloft as prescribed -Continue taking prenatal vitamin as recommended by medical provider -Continue to consider online new mom support   3. Referral(s): Integrated [employer] (In Clinic)  I discussed the assessment and treatment plan with the patient and/or parent/guardian. They were provided an opportunity to ask questions and all were answered. They agreed with the plan and demonstrated an understanding of the instructions.   They were advised to call back or seek an in-person evaluation if the symptoms worsen or if the condition fails to improve as anticipated.  Breanna [name] Breanna [name]  Depression screen [facility] 2/9 08/02/2019 07/18/2019 07/10/2019 06/26/2019 06/14/2019  Decreased Interest 1 1 0 3 2  Down, Depressed, Hopeless 0 0 1 3 2   PHQ - 2 Score 1 1 1 6 4   Altered sleeping 1 0 1 3 2   Tired, decreased energy 3 1 1 3 3   Change in appetite 3 0 1 0 0  Feeling bad or failure about yourself  0 0 1 2 0  Trouble concentrating 3 1 1 3 3   Moving slowly or fidgety/restless 3 0 1 3 3   Suicidal thoughts 0 0 0 0 0  PHQ-9 Score 14 3 7 20 15   Some recent data might be hidden   GAD 7 : Generalized Anxiety Score 08/02/2019 07/18/2019 07/10/2019 06/26/2019  Nervous,  Anxious, on Edge 3 3 1 3   Control/stop worrying 3 3 1 3   Worry too much - different things 3 3 1 3   Trouble relaxing 3 3 1 3   Restless 1 1 1 3   Easily annoyed or irritable 3 3 1 3   Afraid - awful might  happen 3 3 1 3   Total GAD 7 Score 19 19 7  21

## 2019-08-17 ENCOUNTER — Ambulatory Visit (INDEPENDENT_AMBULATORY_CARE_PROVIDER_SITE_OTHER): Payer: Medicaid Other | Admitting: Clinical

## 2019-08-17 DIAGNOSIS — F332 Major depressive disorder, recurrent severe without psychotic features: Secondary | ICD-10-CM

## 2019-08-23 ENCOUNTER — Other Ambulatory Visit: Payer: Self-pay

## 2019-08-23 ENCOUNTER — Encounter: Payer: Self-pay | Admitting: Medical

## 2019-08-23 ENCOUNTER — Ambulatory Visit (INDEPENDENT_AMBULATORY_CARE_PROVIDER_SITE_OTHER): Payer: Medicaid Other | Admitting: Medical

## 2019-08-23 DIAGNOSIS — F331 Major depressive disorder, recurrent, moderate: Secondary | ICD-10-CM

## 2019-08-23 DIAGNOSIS — F329 Major depressive disorder, single episode, unspecified: Secondary | ICD-10-CM

## 2019-08-23 DIAGNOSIS — Z8616 Personal history of COVID-19: Secondary | ICD-10-CM | POA: Insufficient documentation

## 2019-08-23 DIAGNOSIS — Z3009 Encounter for other general counseling and advice on contraception: Secondary | ICD-10-CM | POA: Diagnosis not present

## 2019-08-23 DIAGNOSIS — F419 Anxiety disorder, unspecified: Secondary | ICD-10-CM

## 2019-08-23 DIAGNOSIS — Z3202 Encounter for pregnancy test, result negative: Secondary | ICD-10-CM | POA: Diagnosis not present

## 2019-08-23 DIAGNOSIS — O99345 Other mental disorders complicating the puerperium: Secondary | ICD-10-CM

## 2019-08-23 LAB — POCT PREGNANCY, URINE: Preg Test, Ur: NEGATIVE

## 2019-08-23 MED ORDER — NORETHINDRONE 0.35 MG PO TABS: 1.0000 | ORAL_TABLET | Freq: Every day | ORAL | 11 refills | Status: DC

## 2019-08-23 NOTE — Patient Instructions (Signed)

## 2019-08-23 NOTE — Progress Notes (Signed)
    Post Partum Visit Note  Breanna [name] is a 34 y.o. [phone] female who presents for a postpartum visit. She is 5 weeks postpartum following a normal spontaneous vaginal delivery.  I have fully reviewed the prenatal and intrapartum course. The delivery was at 40.0 gestational weeks.  Anesthesia: epidural. Postpartum course has been complicated by depressions. Baby is doing well. Baby is feeding by breast. Bleeding staining only. Bowel function is normal. Bladder function is normal. Patient is not sexually active. Contraception method is abstinence. Postpartum depression screening: positive - 16  The following portions of the patient's history were reviewed and updated as appropriate: allergies, current medications, past family history, past medical history, past social history, past surgical history and problem list.  Review of Systems Pertinent items are noted in HPI.    Objective:  Blood pressure 108/79, pulse 62, height 5\' 3"  (1.6 m), weight 136 lb 1.6 oz (61.7 kg), last menstrual period 10/12/2018, unknown if currently breastfeeding.  General:  alert and cooperative   Breasts:  deferred  Lungs: clear to auscultation bilaterally  Heart:  regular rate and rhythm, S1, S2 normal, no murmur, click, rub or gallop  Abdomen: soft, non-tender, no masses   Vulva:  not evaluated  Vagina: not evaluated  Cervix:  not evaluated  Corpus: not examined  Adnexa:  not evaluated  Rectal Exam: Not performed.        Assessment:   Normal postpartum exam. Depression   Plan:   Essential components of care per ACOG recommendations:  1.  Mood and well being: Patient with positive depression screening today. Reviewed local resources for support. Has appointment to see [facility] next week. On Zoloft currently - Patient does not use tobacco.  - hx of drug use? No    2. Infant care and feeding:  -Patient currently breastmilk feeding? Yes If breastmilk feeding discussed return to work and pumping. If  needed, patient was provided letter for work to allow for every 2-3 hr pumping breaks, and to be granted a private location to express breastmilk and refrigerated area to store breastmilk. Reviewed importance of draining breast regularly to support lactation. -Social determinants of health (SDOH) reviewed in EPIC. No concerns  3. Sexuality, contraception and birth spacing - Patient does not want a pregnancy in the next year.  Desired family size is 4 children.  - Reviewed forms of contraception in tiered fashion. Patient desired oral progesterone-only contraceptive today.  Rx sent today   4. Sleep and fatigue -Encouraged family/partner/community support of 4 hrs of uninterrupted sleep to help with mood and fatigue  5. Physical Recovery  - Discussed patients delivery and complications - Patient had bilateral periurethral laceration, perineal healing reviewed.  - Patient has urinary incontinence? No  - Patient is not safe to resume physical and sexual activity. 6 weeks  6.  Health Maintenance - Last pap smear done 12/2018 and was normal with negative HPV.  7. Primary Care - Referral to Family Medicine placed   01/2019, [facility] for [facility], [facility]

## 2019-08-23 NOTE — Progress Notes (Signed)
Patient with no questions or concerns today.

## 2019-08-31 ENCOUNTER — Ambulatory Visit (INDEPENDENT_AMBULATORY_CARE_PROVIDER_SITE_OTHER): Payer: Medicaid Other | Admitting: Clinical

## 2019-08-31 ENCOUNTER — Other Ambulatory Visit: Payer: Self-pay | Admitting: Medical

## 2019-08-31 DIAGNOSIS — O99345 Other mental disorders complicating the puerperium: Secondary | ICD-10-CM

## 2019-08-31 DIAGNOSIS — F331 Major depressive disorder, recurrent, moderate: Secondary | ICD-10-CM

## 2019-08-31 DIAGNOSIS — F332 Major depressive disorder, recurrent severe without psychotic features: Secondary | ICD-10-CM | POA: Diagnosis not present

## 2019-08-31 MED ORDER — SERTRALINE HCL 100 MG PO TABS: 100.0000 mg | ORAL_TABLET | Freq: Every day | ORAL | 2 refills | Status: DC

## 2019-08-31 NOTE — Patient Instructions (Signed)
[facility] [address] [place], [place] [address] [phone] (main office) [phone] (Rogerio Boutelle's office)   

## 2019-08-31 NOTE — BH Specialist Note (Signed)
Integrated Behavioral Health via Telemedicine Video (Caregility) Visit  08/31/2019 [name] [id number]  Number of Integrated Behavioral Health visits: 7 (1 less than 15 minute visits) Session Start time: 8:18  Session End time: 8:37 Total time: 19  Referring Provider: Luna Kitchens, CNM Type of Visit: Video Patient/Family location: Home [facility] Provider location: [facility]  All persons participating in visit: Patient Breanna [name] and [facility] Jaykob Minichiello    Confirmed patient's address: Yes  Confirmed patient's phone number: Yes  Any changes to demographics: No   Confirmed patient's insurance: Yes  Any changes to patient's insurance: No   Discussed confidentiality: at previous visit  I connected with Breanna [name] by a video enabled telemedicine application (Caregility) and verified that I am speaking with the correct person using two identifiers.     I discussed the limitations of evaluation and management by telemedicine and the availability of in person appointments.  I discussed that the purpose of this visit is to provide behavioral health care while limiting exposure to the novel coronavirus.   Discussed there is a possibility of technology failure and discussed alternative modes of communication if that failure occurs.  I discussed that engaging in this virtual visit, they consent to the provision of behavioral healthcare and the services will be billed under their insurance.  Patient and/or legal guardian expressed understanding and consented to virtual visit: Yes   PRESENTING CONCERNS: Patient and/or family reports the following symptoms/concerns: Pt states her daily symptoms are anhedonia, depression, fatigue, poor sleep, poor appetite, lack of concentration, slowed movement, anxiety, worry, difficulty relaxing, restlessness, irritability, dread; pt attributes increase in symptoms to difficult family  relations, and having to go back to work with no reliable childcare for baby. Pt has been taking Zoloft for 2 weeks, and feeling no change in emotions; accepts referral to psychiatry.  Duration of problem: Ongoing with increase postpartum; Severity of problem: severe  STRENGTHS (Protective Factors/Coping Skills): Adhering to treatment  GOALS ADDRESSED: Patient will: 1.  Reduce symptoms of: anxiety and depression  2.  Increase knowledge and/or ability of: healthy habits  3.  Demonstrate ability to: Increase healthy adjustment to current life circumstances and Increase motivation to adhere to plan of care  INTERVENTIONS: Interventions utilized:  Medication Monitoring and Psychoeducation and/or Health Education Standardized Assessments completed: GAD-7 and PHQ 9  ASSESSMENT: Patient currently experiencing Major depressive disorder, recurrent, severe, without psychotic features.   Patient may benefit from continued psychoeducation and brief therapeutic interventions regarding coping with symptoms of depression and anxiety .  PLAN: 1. Follow up with behavioral health clinician on : [facility] will f/u via phone 2. Behavioral recommendations:  -Begin taking increased dosage of Zoloft, as prescribed -Accept referral to psychiatry 3. Referral(s): Integrated [employer] (In Clinic) and Psychiatrist  I discussed the assessment and treatment plan with the patient and/or parent/guardian. They were provided an opportunity to ask questions and all were answered. They agreed with the plan and demonstrated an understanding of the instructions.   They were advised to call back or seek an in-person evaluation if the symptoms worsen or if the condition fails to improve as anticipated.  [name] [facility]  Depression screen [facility] 2/9 08/31/2019 08/02/2019 07/18/2019 07/10/2019 06/26/2019  Decreased Interest 3 1 1  0 3  Down, Depressed, Hopeless 3 0 0 1 3  PHQ - 2 Score 6 1 1 1 6   Altered sleeping 3 1  0 1 3  Tired, decreased  energy 3 3 1 1 3   Change in appetite 3 3 0 1 0  Feeling bad or failure about yourself  1 0 0 1 2  Trouble concentrating 3 3 1 1 3   Moving slowly or fidgety/restless 3 3 0 1 3  Suicidal thoughts 0 0 0 0 0  PHQ-9 Score 22 14 3 7 20   Some recent data might be hidden   GAD 7 : Generalized Anxiety Score 08/31/2019 08/02/2019 07/18/2019 07/10/2019  Nervous, Anxious, on Edge 3 3 3 1   Control/stop worrying 3 3 3 1   Worry too much - different things 3 3 3 1   Trouble relaxing 3 3 3 1   Restless 3 1 1 1   Easily annoyed or irritable 3 3 3 1   Afraid - awful might happen 3 3 3 1   Total GAD 7 Score 21 19 19  7

## 2019-09-20 ENCOUNTER — Telehealth: Payer: Self-pay | Admitting: Clinical

## 2019-09-20 NOTE — Telephone Encounter (Signed)
Attempt to f/u on [facility] med management call and referral to psychiatry; Left HIPPA-compliant message to call back Asher Muir from [employer] for [employer] at [facility] at [phone] [facility] office).

## 2020-10-24 ENCOUNTER — Other Ambulatory Visit: Payer: Self-pay

## 2020-10-24 ENCOUNTER — Encounter (HOSPITAL_COMMUNITY): Payer: Self-pay | Admitting: Obstetrics and Gynecology

## 2020-10-24 ENCOUNTER — Inpatient Hospital Stay (HOSPITAL_COMMUNITY)
Admission: AD | Admit: 2020-10-24 | Discharge: 2020-10-24 | Disposition: A | Discharge status: Home / Self Care | Payer: Medicaid Other | Attending: Obstetrics and Gynecology | Admitting: Obstetrics and Gynecology

## 2020-10-24 DIAGNOSIS — Z87891 Personal history of nicotine dependence: Secondary | ICD-10-CM | POA: Diagnosis not present

## 2020-10-24 DIAGNOSIS — O99611 Diseases of the digestive system complicating pregnancy, first trimester: Secondary | ICD-10-CM | POA: Diagnosis not present

## 2020-10-24 DIAGNOSIS — K219 Gastro-esophageal reflux disease without esophagitis: Secondary | ICD-10-CM | POA: Insufficient documentation

## 2020-10-24 DIAGNOSIS — E860 Dehydration: Secondary | ICD-10-CM | POA: Diagnosis not present

## 2020-10-24 DIAGNOSIS — K5900 Constipation, unspecified: Secondary | ICD-10-CM

## 2020-10-24 DIAGNOSIS — Z3A09 9 weeks gestation of pregnancy: Secondary | ICD-10-CM | POA: Diagnosis not present

## 2020-10-24 DIAGNOSIS — Z91040 Latex allergy status: Secondary | ICD-10-CM | POA: Diagnosis not present

## 2020-10-24 DIAGNOSIS — R112 Nausea with vomiting, unspecified: Secondary | ICD-10-CM | POA: Diagnosis not present

## 2020-10-24 DIAGNOSIS — Z79899 Other long term (current) drug therapy: Secondary | ICD-10-CM | POA: Insufficient documentation

## 2020-10-24 DIAGNOSIS — O99281 Endocrine, nutritional and metabolic diseases complicating pregnancy, first trimester: Secondary | ICD-10-CM | POA: Insufficient documentation

## 2020-10-24 DIAGNOSIS — O26891 Other specified pregnancy related conditions, first trimester: Secondary | ICD-10-CM | POA: Diagnosis not present

## 2020-10-24 DIAGNOSIS — R1110 Vomiting, unspecified: Secondary | ICD-10-CM

## 2020-10-24 DIAGNOSIS — R55 Syncope and collapse: Secondary | ICD-10-CM | POA: Diagnosis not present

## 2020-10-24 DIAGNOSIS — Z888 Allergy status to other drugs, medicaments and biological substances status: Secondary | ICD-10-CM | POA: Diagnosis not present

## 2020-10-24 DIAGNOSIS — O219 Vomiting of pregnancy, unspecified: Secondary | ICD-10-CM | POA: Diagnosis not present

## 2020-10-24 LAB — URINALYSIS, ROUTINE W REFLEX MICROSCOPIC
Glucose, UA: NEGATIVE mg/dL
Hgb urine dipstick: NEGATIVE
Ketones, ur: 15 mg/dL — AB
Nitrite: NEGATIVE
Protein, ur: NEGATIVE mg/dL
Specific Gravity, Urine: >1.03 — ABNORMAL HIGH (ref 1.005–1.030)
pH: 6 (ref 5.0–8.0)

## 2020-10-24 LAB — CBC
HCT: 34.3 % — ABNORMAL LOW (ref 36.0–46.0)
Hemoglobin: 11.3 g/dL — ABNORMAL LOW (ref 12.0–15.0)
MCH: 25.6 pg — ABNORMAL LOW (ref 26.0–34.0)
MCHC: 32.9 g/dL (ref 30.0–36.0)
MCV: 77.8 fL — ABNORMAL LOW (ref 80.0–100.0)
Platelets: 273 10*3/uL (ref 150–400)
RBC: 4.41 MIL/uL (ref 3.87–5.11)
RDW: 14 % (ref 11.5–15.5)
WBC: 5.6 10*3/uL (ref 4.0–10.5)
nRBC: 0 % (ref 0.0–0.2)

## 2020-10-24 LAB — URINALYSIS, MICROSCOPIC (REFLEX): Bacteria, UA: NONE SEEN

## 2020-10-24 LAB — BASIC METABOLIC PANEL
Anion gap: 9 (ref 5–15)
BUN: 6 mg/dL (ref 6–20)
CO2: 20 mmol/L — ABNORMAL LOW (ref 22–32)
Calcium: 9.7 mg/dL (ref 8.9–10.3)
Chloride: 104 mmol/L (ref 98–111)
Creatinine, Ser: 0.66 mg/dL (ref 0.44–1.00)
GFR, Estimated: >60 mL/min (ref >60)
Glucose, Bld: 79 mg/dL (ref 70–99)
Potassium: 4 mmol/L (ref 3.5–5.1)
Sodium: 133 mmol/L — ABNORMAL LOW (ref 135–145)

## 2020-10-24 LAB — POCT PREGNANCY, URINE: Preg Test, Ur: POSITIVE — AB

## 2020-10-24 MED ORDER — POLYETHYLENE GLYCOL 3350 17 G PO PACK: 17.0000 g | PACK | Freq: Every day | ORAL | 0 refills | Status: DC

## 2020-10-24 MED ORDER — METOCLOPRAMIDE HCL 5 MG PO TABS: 5.0000 mg | ORAL_TABLET | Freq: Four times a day (QID) | ORAL | 0 refills | Status: DC | PRN

## 2020-10-24 MED ORDER — METOCLOPRAMIDE HCL 5 MG/ML IJ SOLN
5.0000 mg | Freq: Once | INTRAMUSCULAR | Status: AC
  Administered 2020-10-24: 5 mg via INTRAVENOUS
  Filled 2020-10-24: qty 2

## 2020-10-24 MED ORDER — LACTATED RINGERS IV SOLN: Freq: Once | INTRAVENOUS | Status: AC

## 2020-10-24 NOTE — MAU Provider Note (Signed)
Chief Complaint: Dehydration   Event Date/Time   First Provider Initiated Contact with Patient 10/24/20 2140        SUBJECTIVE HPI: Breanna [name] is a 35 y.o. [id number] at Unknown by LMP who presents to maternity admissions reporting dehydration and vomiting.  Has only vomited once but has lots of reflux of food, constipation and lightheadedness. . She denies vaginal bleeding, vaginal itching/burning, urinary symptoms, h/a, dizziness, or fever/chills.    Emesis  This is a new problem. The current episode started in the past 7 days. The problem occurs less than 2 times per day. There has been no fever. Pertinent negatives include no chills, diarrhea, dizziness, fever, headaches or myalgias. Treatments tried: hydration. The treatment provided no relief.  RN Note: I have been trying to trouble shoot for 2 or so wks but I get dehydrated in early pregnancy. I have only thrown up once but reflux chunks of food. I came because I am feeling close to passing out. Denies VB or d/c. Some abd pressure due constipation but not really pain  Past Medical History:  Diagnosis Date   Anemia    Anxiety    BV (bacterial vaginosis)    Depression    Herpes genitalia    History of postpartum depression, currently pregnant    PTSD (post-traumatic stress disorder)    UTI (lower urinary tract infection)    Past Surgical History:  Procedure Laterality Date   APPENDECTOMY  2018   MOUTH SURGERY     RHINOPLASTY     Social History   Socioeconomic History   Marital status: Single    Spouse name: Not on file   Number of children: Not on file   Years of education: Not on file   Highest education level: Not on file  Occupational History   Not on file  Tobacco Use   Smoking status: Former    Types: Cigarettes    Quit date: 02/17/2012    Years since quitting: 8.6   Smokeless tobacco: Never  Vaping Use   Vaping Use: Never used  Substance and Sexual Activity   Alcohol use: Not Currently    Comment:  ocasionally   Drug use: No   Sexual activity: Not Currently    Birth control/protection: None  Other Topics Concern   Not on file  Social History Narrative   Not on file   Social Determinants of Health   Financial Resource Strain: Not on file  Food Insecurity: Not on file  Transportation Needs: Not on file  Physical Activity: Not on file  Stress: Not on file  Social Connections: Not on file  Intimate Partner Violence: Not on file   No current facility-administered medications on file prior to encounter.   Current Outpatient Medications on File Prior to Encounter  Medication Sig Dispense Refill   ibuprofen (ADVIL) 600 MG tablet Take 1 tablet (600 mg total) by mouth every 6 (six) hours. (Patient not taking: Reported on 08/23/2019) 30 tablet 0   norethindrone (MICRONOR) 0.35 MG tablet Take 1 tablet (0.35 mg total) by mouth daily. 28 tablet 11   Prenatal MV & Min w/FA-DHA (PRENATAL ADULT GUMMY/DHA/FA PO) Take 1 tablet by mouth daily.      sertraline (ZOLOFT) 100 MG tablet Take 1 tablet (100 mg total) by mouth daily. 30 tablet 2   [DISCONTINUED] ferrous sulfate 325 (65 FE) MG tablet Take 1 tablet (325 mg total) by mouth daily with breakfast. (Patient not taking: Reported on 06/27/2015) 30 tablet 3  Allergies  Allergen Reactions   Latex Itching    Vaginal irritation   Tape Rash    I have reviewed patient's Past Medical Hx, Surgical Hx, Family Hx, Social Hx, medications and allergies.   ROS:  Review of Systems  Constitutional:  Negative for chills and fever.  Gastrointestinal:  Positive for vomiting. Negative for diarrhea.  Musculoskeletal:  Negative for myalgias.  Neurological:  Negative for dizziness and headaches.  Review of Systems  Other systems negative   Physical Exam  Physical Exam Patient Vitals for the past 24 hrs:  BP Temp Pulse Resp SpO2 Height Weight  10/24/20 2132 -- -- -- -- 100 % -- --  10/24/20 2129 108/73 -- 66 -- -- -- --  10/24/20 2126 -- 98.2 F (36.8  C) -- 16 -- 5\' 4"  (1.626 m) 67.1 kg   Constitutional: Well-developed, well-nourished female in no acute distress.  Cardiovascular: normal rate Respiratory: normal effort GI: Abd soft, non-tender. Pos BS x 4 MS: Extremities nontender, no edema, normal ROM Neurologic: Alert and oriented x 4.  GU: Neg CVAT.  PELVIC EXAM: deferred  LAB RESULTS Results for orders placed or performed during the hospital encounter of 10/24/20 (from the past 24 hour(s))  Urinalysis, Routine w reflex microscopic Urine, Clean Catch     Status: Abnormal   Collection Time: 10/24/20  9:37 PM  Result Value Ref Range   Color, Urine YELLOW YELLOW   APPearance CLOUDY (A) CLEAR   Specific Gravity, Urine >1.030 (H) 1.005 - 1.030   pH 6.0 5.0 - 8.0   Glucose, UA NEGATIVE NEGATIVE mg/dL   Hgb urine dipstick NEGATIVE NEGATIVE   Bilirubin Urine SMALL (A) NEGATIVE   Ketones, ur 15 (A) NEGATIVE mg/dL   Protein, ur NEGATIVE NEGATIVE mg/dL   Nitrite NEGATIVE NEGATIVE   Leukocytes,Ua MODERATE (A) NEGATIVE  Urinalysis, Microscopic (reflex)     Status: None   Collection Time: 10/24/20  9:37 PM  Result Value Ref Range   RBC / HPF 6-10 0 - 5 RBC/hpf   WBC, UA 21-50 0 - 5 WBC/hpf   Bacteria, UA NONE SEEN NONE SEEN   Squamous Epithelial / LPF 11-20 0 - 5   Mucus PRESENT   Pregnancy, urine POC     Status: Abnormal   Collection Time: 10/24/20  9:44 PM  Result Value Ref Range   Preg Test, Ur POSITIVE (A) NEGATIVE  Basic metabolic panel     Status: Abnormal   Collection Time: 10/24/20  9:52 PM  Result Value Ref Range   Sodium 133 (L) 135 - 145 mmol/L   Potassium 4.0 3.5 - 5.1 mmol/L   Chloride 104 98 - 111 mmol/L   CO2 20 (L) 22 - 32 mmol/L   Glucose, Bld 79 70 - 99 mg/dL   BUN 6 6 - 20 mg/dL   Creatinine, Ser 12/24/20 0.44 - 1.00 mg/dL   Calcium 9.7 8.9 - 0.07 mg/dL   GFR, Estimated 12.1 >97 mL/min   Anion gap 9 5 - 15  CBC     Status: Abnormal   Collection Time: 10/24/20 10:30 PM  Result Value Ref Range   WBC 5.6 4.0  - 10.5 K/uL   RBC 4.41 3.87 - 5.11 MIL/uL   Hemoglobin 11.3 (L) 12.0 - 15.0 g/dL   HCT 12/24/20 (L) 83.2 - 54.9 %   MCV 77.8 (L) 80.0 - 100.0 fL   MCH 25.6 (L) 26.0 - 34.0 pg   MCHC 32.9 30.0 - 36.0 g/dL   RDW 14.0  11.5 - 15.5 %   Platelets 273 150 - 400 K/uL   nRBC 0.0 0.0 - 0.2 %    IMAGING Pt informed that the ultrasound is considered a limited OB ultrasound and is not intended to be a complete ultrasound exam.  Patient also informed that the ultrasound is not being completed with the intent of assessing for fetal or placental anomalies or any pelvic abnormalities.  Explained that the purpose of today's ultrasound is to assess for Viability.  Patient acknowledges the purpose of the exam and the limitations of the study.   Single IUGS seen with embryo measuring [redacted]w[redacted]d FHR 160 Fetus active  MAU Management/MDM: Ordered labs for eval.  No hypokalemia or leukocytosis IV fluids ordered with Reglan for gastric emptying and nausea She felt immensely better after one liter of fluid  ASSESSMENT Single IUP at [redacted]w[redacted]d by LMP, [redacted]w[redacted]d by informal [place] Vomiting, limited Constipation Food regurgitation  PLAN Discharge home Rx Reglan for nausea and regurgitation Rx Miralax for constipation Folllow up with prenatal care  Pt stable at time of discharge. Encouraged to return here if she develops worsening of symptoms, increase in pain, fever, or other concerning symptoms.    Wynelle Bourgeois CNM, MSN Certified Nurse-Midwife 10/24/2020  9:40 PM

## 2020-10-24 NOTE — MAU Note (Signed)
I have been trying to trouble shoot for 2 or so wks but I get dehydrated in early pregnancy. I have only thrown up once but reflux chunks of food. I came because I am feeling close to passing out. Denies VB or d/c. Some abd pressure due constipation but not really pain

## 2020-11-07 ENCOUNTER — Other Ambulatory Visit: Payer: Self-pay

## 2020-11-07 ENCOUNTER — Encounter (HOSPITAL_COMMUNITY): Payer: Self-pay | Admitting: Obstetrics and Gynecology

## 2020-11-07 ENCOUNTER — Inpatient Hospital Stay (HOSPITAL_COMMUNITY)
Admission: AD | Admit: 2020-11-07 | Discharge: 2020-11-07 | Disposition: A | Discharge status: Home / Self Care | Payer: Medicaid Other | Attending: Obstetrics and Gynecology | Admitting: Obstetrics and Gynecology

## 2020-11-07 DIAGNOSIS — Z3A10 10 weeks gestation of pregnancy: Secondary | ICD-10-CM | POA: Diagnosis not present

## 2020-11-07 DIAGNOSIS — O210 Mild hyperemesis gravidarum: Secondary | ICD-10-CM | POA: Diagnosis present

## 2020-11-07 DIAGNOSIS — O211 Hyperemesis gravidarum with metabolic disturbance: Secondary | ICD-10-CM

## 2020-11-07 DIAGNOSIS — Z87891 Personal history of nicotine dependence: Secondary | ICD-10-CM | POA: Insufficient documentation

## 2020-11-07 DIAGNOSIS — O219 Vomiting of pregnancy, unspecified: Secondary | ICD-10-CM

## 2020-11-07 DIAGNOSIS — E860 Dehydration: Secondary | ICD-10-CM | POA: Diagnosis not present

## 2020-11-07 DIAGNOSIS — O99611 Diseases of the digestive system complicating pregnancy, first trimester: Secondary | ICD-10-CM | POA: Insufficient documentation

## 2020-11-07 DIAGNOSIS — K219 Gastro-esophageal reflux disease without esophagitis: Secondary | ICD-10-CM | POA: Diagnosis not present

## 2020-11-07 LAB — BASIC METABOLIC PANEL
Anion gap: 10 (ref 5–15)
BUN: 7 mg/dL (ref 6–20)
CO2: 18 mmol/L — ABNORMAL LOW (ref 22–32)
Calcium: 8.7 mg/dL — ABNORMAL LOW (ref 8.9–10.3)
Chloride: 105 mmol/L (ref 98–111)
Creatinine, Ser: 0.58 mg/dL (ref 0.44–1.00)
GFR, Estimated: >60 mL/min (ref >60)
Glucose, Bld: 75 mg/dL (ref 70–99)
Potassium: 5.3 mmol/L — ABNORMAL HIGH (ref 3.5–5.1)
Sodium: 133 mmol/L — ABNORMAL LOW (ref 135–145)

## 2020-11-07 LAB — URINALYSIS, ROUTINE W REFLEX MICROSCOPIC
Bilirubin Urine: NEGATIVE
Glucose, UA: NEGATIVE mg/dL
Hgb urine dipstick: NEGATIVE
Ketones, ur: 20 mg/dL — AB
Nitrite: NEGATIVE
Protein, ur: 30 mg/dL — AB
Specific Gravity, Urine: 1.029 (ref 1.005–1.030)
WBC, UA: >50 WBC/hpf — ABNORMAL HIGH (ref 0–5)
pH: 8 (ref 5.0–8.0)

## 2020-11-07 LAB — CBC
HCT: 36.2 % (ref 36.0–46.0)
Hemoglobin: 11.9 g/dL — ABNORMAL LOW (ref 12.0–15.0)
MCH: 25.5 pg — ABNORMAL LOW (ref 26.0–34.0)
MCHC: 32.9 g/dL (ref 30.0–36.0)
MCV: 77.5 fL — ABNORMAL LOW (ref 80.0–100.0)
Platelets: 288 10*3/uL (ref 150–400)
RBC: 4.67 MIL/uL (ref 3.87–5.11)
RDW: 14.6 % (ref 11.5–15.5)
WBC: 5.1 10*3/uL (ref 4.0–10.5)
nRBC: 0 % (ref 0.0–0.2)

## 2020-11-07 MED ORDER — LACTATED RINGERS IV BOLUS
1000.0000 mL | Freq: Once | INTRAVENOUS | Status: AC
  Administered 2020-11-07: 1000 mL via INTRAVENOUS

## 2020-11-07 MED ORDER — FAMOTIDINE 20 MG PO TABS: 20.0000 mg | ORAL_TABLET | Freq: Two times a day (BID) | ORAL | 0 refills | Status: DC

## 2020-11-07 MED ORDER — DIPHENHYDRAMINE HCL 50 MG/ML IJ SOLN
25.0000 mg | Freq: Once | INTRAMUSCULAR | Status: AC
  Administered 2020-11-07: 25 mg via INTRAVENOUS
  Filled 2020-11-07: qty 1

## 2020-11-07 MED ORDER — FAMOTIDINE 20 MG IN NS 100 ML IVPB
20.0000 mg | Freq: Once | INTRAVENOUS | Status: AC
  Administered 2020-11-07: 20 mg via INTRAVENOUS
  Filled 2020-11-07: qty 100

## 2020-11-07 MED ORDER — SCOPOLAMINE 1 MG/3DAYS TD PT72: 1.0000 | MEDICATED_PATCH | TRANSDERMAL | 12 refills | Status: DC

## 2020-11-07 MED ORDER — SODIUM CHLORIDE 0.9 % IV SOLN
8.0000 mg | INTRAVENOUS | Status: AC
  Administered 2020-11-07: 8 mg via INTRAVENOUS
  Filled 2020-11-07: qty 4

## 2020-11-07 MED ORDER — SCOPOLAMINE 1 MG/3DAYS TD PT72
1.0000 | MEDICATED_PATCH | TRANSDERMAL | Status: DC
  Administered 2020-11-07: 1.5 mg via TRANSDERMAL
  Filled 2020-11-07: qty 1

## 2020-11-07 MED ORDER — METOCLOPRAMIDE HCL 5 MG/ML IJ SOLN
10.0000 mg | Freq: Once | INTRAMUSCULAR | Status: AC
  Administered 2020-11-07: 10 mg via INTRAVENOUS
  Filled 2020-11-07: qty 2

## 2020-11-07 MED ORDER — PROMETHAZINE HCL 25 MG PO TABS: 25.0000 mg | ORAL_TABLET | Freq: Four times a day (QID) | ORAL | 2 refills | Status: DC | PRN

## 2020-11-07 NOTE — MAU Note (Signed)
Presents with c/o N/V, reports only able to keep popsicles down.  States taking Reglan but continues to vomit.  Denies VB or abdominal pain,  states has abdominal pressure.

## 2020-11-07 NOTE — MAU Provider Note (Addendum)
History     CSN: [id number]  Arrival date and time: 11/07/20 1658   Event Date/Time   First Provider Initiated Contact with Patient 11/07/20 1914      Chief Complaint  Patient presents with   Emesis   Nausea   HPI Breanna [name] is a 35 y.o. [id number] at [redacted]w[redacted]d who presents to MAU with chief complaint of nausea and vomiting. These are recurrent problems, which patient previously managed with Reglan. However, the Reglan as since become ineffective and patient isn't able to tolerate anything PO except popsicles. She endorses intermittent abdominal pressure but denies pain, vaginal bleeding, dysuria.  OB History     Gravida  7   Para  4   Term  4   Preterm  0   AB  2   Living  4      SAB  1   IAB  1   Ectopic  0   Multiple  0   Live Births  4           Past Medical History:  Diagnosis Date   Anemia    Anxiety    BV (bacterial vaginosis)    Depression    Herpes genitalia    History of postpartum depression, currently pregnant    PTSD (post-traumatic stress disorder)    UTI (lower urinary tract infection)     Past Surgical History:  Procedure Laterality Date   APPENDECTOMY  2018   MOUTH SURGERY     RHINOPLASTY      Family History  Problem Relation Age of Onset   Healthy Mother    Healthy Father    Thyroid disease Maternal Grandmother    Bipolar disorder Maternal Grandmother    Diabetes Paternal Grandmother    Heart disease Paternal Grandmother    Hypertension Paternal Grandmother    Alcohol abuse Neg Hx    Arthritis Neg Hx    Asthma Neg Hx    Birth defects Neg Hx    Cancer Neg Hx    COPD Neg Hx    Depression Neg Hx    Drug abuse Neg Hx    Early death Neg Hx    Hearing loss Neg Hx    Hyperlipidemia Neg Hx    Learning disabilities Neg Hx    Kidney disease Neg Hx    Mental illness Neg Hx    Mental retardation Neg Hx    Miscarriages / Stillbirths Neg Hx    Stroke Neg Hx    Vision loss Neg Hx     Social History   Tobacco Use    Smoking status: Former    Types: Cigarettes    Quit date: 02/17/2012    Years since quitting: 8.7   Smokeless tobacco: Never  Vaping Use   Vaping Use: Never used  Substance Use Topics   Alcohol use: Not Currently    Comment: ocasionally   Drug use: No    Allergies:  Allergies  Allergen Reactions   Latex Itching    Vaginal irritation   Tape Rash    Medications Prior to Admission  Medication Sig Dispense Refill Last Dose   calcium carbonate (TUMS EX) 750 MG chewable tablet Chew 1 tablet by mouth daily.   11/06/2020   metoCLOPramide (REGLAN) 5 MG tablet Take 1 tablet (5 mg total) by mouth every 6 (six) hours as needed for nausea. 30 tablet 0 11/07/2020 at 0800   Prenatal MV & Min w/FA-DHA (PRENATAL ADULT GUMMY/DHA/FA PO) Take 1 tablet by  mouth daily.    Past Week   polyethylene glycol (MIRALAX) 17 g packet Take 17 g by mouth daily. 14 each 0    sertraline (ZOLOFT) 100 MG tablet Take 1 tablet (100 mg total) by mouth daily. 30 tablet 2     Review of Systems  Gastrointestinal:  Positive for nausea and vomiting.  All other systems reviewed and are negative. Physical Exam   Blood pressure 106/76, pulse 76, temperature 98.1 F (36.7 C), temperature source Oral, resp. rate 20, height 5\' 4"  (1.626 m), weight 65.5 kg, last menstrual period 08/23/2020, SpO2 98 %, unknown if currently breastfeeding.  Physical Exam Vitals and nursing note reviewed. Exam conducted with a chaperone present.  Constitutional:      Appearance: Normal appearance. She is ill-appearing.  HENT:     Mouth/Throat:     Mouth: Mucous membranes are pale and dry.  Cardiovascular:     Rate and Rhythm: Normal rate and regular rhythm.     Pulses: Normal pulses.     Heart sounds: Normal heart sounds.  Pulmonary:     Effort: Pulmonary effort is normal.  Skin:    General: Skin is dry.     Capillary Refill: Capillary refill takes less than 2 seconds.     Coloration: Skin is pale.  Neurological:     Mental Status: She  is alert and oriented to person, place, and time.  Psychiatric:        Mood and Affect: Mood normal.        Behavior: Behavior normal.        Thought Content: Thought content normal.        Judgment: Judgment normal.    MAU Course  Procedures  --CNM returned to bedside at 0840. Patient 20 min s/p PO challenge, overtly struggling not to vomit. With patient buy-in, will order Reglan and Scopolamine patch, retry PO challenge when ready.  Orders Placed This Encounter  Procedures   Urinalysis, Routine w reflex microscopic Urine, Clean Catch   CBC   Basic metabolic panel   Blood draw with IV start   Insert peripheral IV   Meds ordered this encounter  Medications   lactated ringers bolus 1,000 mL   ondansetron (ZOFRAN) 8 mg in sodium chloride 0.9 % 50 mL IVPB   famotidine (PEPCID) IVPB 20 mg in NS 100 mL IVPB   scopolamine (TRANSDERM-SCOP) 1 MG/3DAYS 1.5 mg   metoCLOPramide (REGLAN) injection 10 mg   diphenhydrAMINE (BENADRYL) injection 25 mg     Patient Vitals for the past 24 hrs:  BP Temp Temp src Pulse Resp SpO2 Height Weight  11/07/20 1716 106/76 98.1 F (36.7 C) Oral 76 20 98 % -- --  11/07/20 1710 -- -- -- -- -- -- 5\' 4"  (1.626 m) 65.5 kg   Results for orders placed or performed during the hospital encounter of 11/07/20 (from the past 24 hour(s))  Urinalysis, Routine w reflex microscopic Urine, Clean Catch     Status: Abnormal   Collection Time: 11/07/20  4:58 PM  Result Value Ref Range   Color, Urine YELLOW YELLOW   APPearance HAZY (A) CLEAR   Specific Gravity, Urine 1.029 1.005 - 1.030   pH 8.0 5.0 - 8.0   Glucose, UA NEGATIVE NEGATIVE mg/dL   Hgb urine dipstick NEGATIVE NEGATIVE   Bilirubin Urine NEGATIVE NEGATIVE   Ketones, ur 20 (A) NEGATIVE mg/dL   Protein, ur 30 (A) NEGATIVE mg/dL   Nitrite NEGATIVE NEGATIVE   Leukocytes,Ua LARGE (A) NEGATIVE  RBC / HPF 0-5 0 - 5 RBC/hpf   WBC, UA >50 (H) 0 - 5 WBC/hpf   Bacteria, UA RARE (A) NONE SEEN   Squamous  Epithelial / LPF 11-20 0 - 5   Mucus PRESENT   CBC     Status: Abnormal   Collection Time: 11/07/20  5:50 PM  Result Value Ref Range   WBC 5.1 4.0 - 10.5 K/uL   RBC 4.67 3.87 - 5.11 MIL/uL   Hemoglobin 11.9 (L) 12.0 - 15.0 g/dL   HCT 22.9 79.8 - 92.1 %   MCV 77.5 (L) 80.0 - 100.0 fL   MCH 25.5 (L) 26.0 - 34.0 pg   MCHC 32.9 30.0 - 36.0 g/dL   RDW 19.4 17.4 - 08.1 %   Platelets 288 150 - 400 K/uL   nRBC 0.0 0.0 - 0.2 %  Basic metabolic panel     Status: Abnormal   Collection Time: 11/07/20  5:50 PM  Result Value Ref Range   Sodium 133 (L) 135 - 145 mmol/L   Potassium 5.3 (H) 3.5 - 5.1 mmol/L   Chloride 105 98 - 111 mmol/L   CO2 18 (L) 22 - 32 mmol/L   Glucose, Bld 75 70 - 99 mg/dL   BUN 7 6 - 20 mg/dL   Creatinine, Ser 4.48 0.44 - 1.00 mg/dL   Calcium 8.7 (L) 8.9 - 10.3 mg/dL   GFR, Estimated >18 >56 mL/min   Anion gap 10 5 - 15   Report given to M. Mayford Knife, CNM who assumes care of patient at this time  Clayton Bibles, MSN, CNM Certified Nurse Midwife, [facility] for [employer], [facility] 11/07/20 8:53 PM   Felt better after fluids and meds   Assessment and Plan  A:  Single IUP at [redacted]w[redacted]d       Nausea and vomiting       Dehydration  P:   Discharge home       Rx Pepcid for acid reflux       Rx Phenergan PO/PV prn nausea       Rx scopolamine patches prn nausea      Advance diet as tolerated       Encouraged to return if she develops worsening of symptoms, increase in pain, fever, or other concerning symptoms.   Aviva Signs, CNM

## 2020-11-08 ENCOUNTER — Other Ambulatory Visit: Payer: Self-pay

## 2020-11-11 ENCOUNTER — Inpatient Hospital Stay (HOSPITAL_COMMUNITY)
Admission: AD | Admit: 2020-11-11 | Discharge: 2020-11-11 | Disposition: A | Discharge status: Home / Self Care | Payer: Medicaid Other | Attending: Obstetrics & Gynecology | Admitting: Obstetrics & Gynecology

## 2020-11-11 ENCOUNTER — Encounter (HOSPITAL_COMMUNITY): Payer: Self-pay | Admitting: Obstetrics & Gynecology

## 2020-11-11 ENCOUNTER — Other Ambulatory Visit: Payer: Self-pay

## 2020-11-11 DIAGNOSIS — R8290 Unspecified abnormal findings in urine: Secondary | ICD-10-CM | POA: Diagnosis not present

## 2020-11-11 DIAGNOSIS — O211 Hyperemesis gravidarum with metabolic disturbance: Secondary | ICD-10-CM | POA: Insufficient documentation

## 2020-11-11 DIAGNOSIS — O99281 Endocrine, nutritional and metabolic diseases complicating pregnancy, first trimester: Secondary | ICD-10-CM | POA: Diagnosis not present

## 2020-11-11 DIAGNOSIS — E860 Dehydration: Secondary | ICD-10-CM

## 2020-11-11 DIAGNOSIS — Z3A11 11 weeks gestation of pregnancy: Secondary | ICD-10-CM | POA: Diagnosis not present

## 2020-11-11 DIAGNOSIS — O219 Vomiting of pregnancy, unspecified: Secondary | ICD-10-CM | POA: Diagnosis not present

## 2020-11-11 DIAGNOSIS — O210 Mild hyperemesis gravidarum: Secondary | ICD-10-CM | POA: Diagnosis present

## 2020-11-11 DIAGNOSIS — O26891 Other specified pregnancy related conditions, first trimester: Secondary | ICD-10-CM | POA: Diagnosis not present

## 2020-11-11 LAB — URINALYSIS, ROUTINE W REFLEX MICROSCOPIC
Bilirubin Urine: NEGATIVE
Glucose, UA: NEGATIVE mg/dL
Ketones, ur: 80 mg/dL — AB
Nitrite: NEGATIVE
Protein, ur: 100 mg/dL — AB
Specific Gravity, Urine: 1.031 — ABNORMAL HIGH (ref 1.005–1.030)
WBC, UA: >50 WBC/hpf — ABNORMAL HIGH (ref 0–5)
pH: 5 (ref 5.0–8.0)

## 2020-11-11 MED ORDER — METOCLOPRAMIDE HCL 5 MG PO TABS: 5.0000 mg | ORAL_TABLET | Freq: Three times a day (TID) | ORAL | 0 refills | Status: DC | PRN

## 2020-11-11 MED ORDER — LACTATED RINGERS IV BOLUS
1000.0000 mL | Freq: Once | INTRAVENOUS | Status: AC
  Administered 2020-11-11: 1000 mL via INTRAVENOUS

## 2020-11-11 MED ORDER — FAMOTIDINE 20 MG IN NS 100 ML IVPB
20.0000 mg | Freq: Once | INTRAVENOUS | Status: AC
  Administered 2020-11-11: 20 mg via INTRAVENOUS
  Filled 2020-11-11: qty 100

## 2020-11-11 MED ORDER — METOCLOPRAMIDE HCL 5 MG/ML IJ SOLN
10.0000 mg | Freq: Once | INTRAMUSCULAR | Status: AC
  Administered 2020-11-11: 10 mg via INTRAVENOUS
  Filled 2020-11-11: qty 2

## 2020-11-11 NOTE — MAU Note (Signed)
Reports she has not been able to keep anything down for over 24 hrs. Has not been able to take her nausea medicine since Friday.  Feel weak and dizzy. C/o joint pain and some chest tightness

## 2020-11-11 NOTE — MAU Provider Note (Signed)
History     CSN: [id number]  Arrival date and time: 11/11/20 1636   None     Chief Complaint  Patient presents with   Hyperemesis Gravidarum   Breanna [name] is a 35 year old female [id number] at [redacted]w[redacted]d gestation presenting for evaluation of recurrent nausea/vomiting and dehydration.  She has known N/V already during this pregnancy, last seen in the MAU on 9/22.  Reports she has not been able to keep any food or drink down in the last 24 hours.  Last ate yesterday around 11 AM, ate 1 [place] fry.  Attempted to drink some ginger ale this morning, however proceeded to vomit afterwards.  Emesis is clear.  5-7 episodes in the past 24 hours.  She last attempted to take antinausea medication on Saturday, 9/24, but vomited the Zofran back up.  She has Phenergan at home as well, however is not taking it to avoid drowsiness.  She ran out of her Reglan on Friday which had been overall effective to keep recurrent emesis at bay, believes this caused her symptoms to get out of control again.  Scopolamine patches irritate her skin, does not want to use.  Feels like she has been having some abdominal twitching but otherwise denies any pain/cramping, leakage of fluid, or vaginal bleeding. Feeling generally weak and fatigued. Having some heartburn as well. Urine decreased today, "dribbling" twice since this morning, and darker in color.  Denies any dysuria, CVA tenderness, fever, urinary urgency.  Has initial prenatal scheduled in 2 days at [facility].    Past Medical History:  Diagnosis Date   Anemia    Anxiety    BV (bacterial vaginosis)    Depression    Herpes genitalia    History of postpartum depression, currently pregnant    PTSD (post-traumatic stress disorder)    UTI (lower urinary tract infection)     Past Surgical History:  Procedure Laterality Date   APPENDECTOMY  2018   MOUTH SURGERY     RHINOPLASTY      Family History  Problem Relation Age of Onset   Healthy Mother    Healthy Father    Thyroid  disease Maternal Grandmother    Bipolar disorder Maternal Grandmother    Diabetes Paternal Grandmother    Heart disease Paternal Grandmother    Hypertension Paternal Grandmother    Alcohol abuse Neg Hx    Arthritis Neg Hx    Asthma Neg Hx    Birth defects Neg Hx    Cancer Neg Hx    COPD Neg Hx    Depression Neg Hx    Drug abuse Neg Hx    Early death Neg Hx    Hearing loss Neg Hx    Hyperlipidemia Neg Hx    Learning disabilities Neg Hx    Kidney disease Neg Hx    Mental illness Neg Hx    Mental retardation Neg Hx    Miscarriages / Stillbirths Neg Hx    Stroke Neg Hx    Vision loss Neg Hx     Social History   Tobacco Use   Smoking status: Former    Types: Cigarettes    Quit date: 02/17/2012    Years since quitting: 8.7   Smokeless tobacco: Never  Vaping Use   Vaping Use: Never used  Substance Use Topics   Alcohol use: Not Currently    Comment: ocasionally   Drug use: No    Allergies:  Allergies  Allergen Reactions   Latex Itching    Vaginal irritation  Scopolamine Rash   Tape Rash    Medications Prior to Admission  Medication Sig Dispense Refill Last Dose   famotidine (PEPCID) 20 MG tablet Take 1 tablet (20 mg total) by mouth 2 (two) times daily. 30 tablet 0 Past Week   ondansetron (ZOFRAN) 8 MG tablet Take 8 mg by mouth every 8 (eight) hours as needed for nausea or vomiting.   Past Week   Prenatal MV & Min w/FA-DHA (PRENATAL ADULT GUMMY/DHA/FA PO) Take 1 tablet by mouth daily.    Past Month   calcium carbonate (TUMS EX) 750 MG chewable tablet Chew 1 tablet by mouth daily.      metoCLOPramide (REGLAN) 5 MG tablet Take 1 tablet (5 mg total) by mouth every 6 (six) hours as needed for nausea. 30 tablet 0    polyethylene glycol (MIRALAX) 17 g packet Take 17 g by mouth daily. 14 each 0    promethazine (PHENERGAN) 25 MG tablet Take 1 tablet (25 mg total) by mouth every 6 (six) hours as needed for nausea or vomiting. 30 tablet 2    scopolamine (TRANSDERM-SCOP) 1  MG/3DAYS Place 1 patch (1.5 mg total) onto the skin every 3 (three) days. 10 patch 12    sertraline (ZOLOFT) 100 MG tablet Take 1 tablet (100 mg total) by mouth daily. 30 tablet 2     Review of Systems  Constitutional:  Positive for appetite change and fatigue. Negative for activity change and fever.  Eyes:  Negative for visual disturbance.  Respiratory:  Negative for shortness of breath.   Cardiovascular:  Negative for palpitations.  Gastrointestinal:  Positive for nausea and vomiting. Negative for abdominal distention, abdominal pain, constipation and diarrhea.  Genitourinary:  Positive for decreased urine volume. Negative for dysuria, flank pain, frequency and vaginal bleeding.  Neurological:  Positive for weakness. Negative for syncope, numbness and headaches.  Psychiatric/Behavioral:  Negative for confusion.   Physical Exam   Blood pressure 115/73, pulse 87, temperature 98.3 F (36.8 C), resp. rate 18, last menstrual period 08/23/2020, unknown if currently breastfeeding.  FHT 166 bpm.   Physical Exam Constitutional:      Comments: Appears tired but nontoxic.  HENT:     Head: Normocephalic and atraumatic.     Mouth/Throat:     Mouth: Mucous membranes are dry.  Eyes:     Extraocular Movements: Extraocular movements intact.  Cardiovascular:     Pulses: Normal pulses.  Pulmonary:     Effort: Pulmonary effort is normal.  Abdominal:     Comments: Soft, nondistended, nontender to palpation of all 4 quadrants.  No CVA tenderness bilaterally.  Skin:    General: Skin is warm and dry.     Capillary Refill: Capillary refill takes less than 2 seconds.  Neurological:     Mental Status: She is alert and oriented to person, place, and time.  Psychiatric:        Mood and Affect: Mood normal.        Behavior: Behavior normal.    MAU Course   MDM UA consistent with dehydration> elevated SG and 80 ketones. Pt reports not a clean catch, numerous squamous epithelial cells. No urinary  s/sx concerning for infection.  Start IV with LR bolus  IV pepcid + reglan and reassess.   Reassessed 1900: IVF bolus almost complete, pepcid/reglan finished. Still feeling alittle fatigued, would like to try po challenge. Nausea and heartburn has resolved.   Reassessed 1945: Feeling much better, like her regular self. Tolerated several packages of crackers  and full AJ/water without emesis. No emesis since MAU evaluation. Pt feels ready to discharge home.   Assessment and Plan  1. Nausea and vomiting in pregnancy Much improved after IV fluids, pepcid, reglan. Benign abdomen. Rx'd refill of reglan. Cont daily pepcid. Encouraged small freq meals/sips of fluid, B6, ginger, sea bands.  2. Dehydration  S/p IV fluids, now tolerated po hydration.  3. [redacted] weeks gestation of pregnancy   4. Abnormal Urinalysis Dehydrated and not a clean catch. No urinary symptoms. Will have ob culture obtained during initial prenatal visit on Wednesday.   Discharged home in stable condition. Maintain initial prenatal in 2 days. MAU precautions discussed.   Allayne Stack 11/11/2020, 6:25 PM

## 2020-11-11 NOTE — Discharge Instructions (Signed)
Try B6 vitamins, ginger, and sea bands to also help with your nausea. Small frequent meals, room temperature fluids with small sips at a time.

## 2020-11-13 ENCOUNTER — Encounter: Payer: Medicaid Other | Admitting: Family Medicine

## 2020-11-20 ENCOUNTER — Telehealth (INDEPENDENT_AMBULATORY_CARE_PROVIDER_SITE_OTHER): Payer: Medicaid Other

## 2020-11-20 DIAGNOSIS — Z3A00 Weeks of gestation of pregnancy not specified: Secondary | ICD-10-CM

## 2020-11-20 DIAGNOSIS — Z3490 Encounter for supervision of normal pregnancy, unspecified, unspecified trimester: Secondary | ICD-10-CM

## 2020-11-20 DIAGNOSIS — O219 Vomiting of pregnancy, unspecified: Secondary | ICD-10-CM

## 2020-11-20 DIAGNOSIS — O0990 Supervision of high risk pregnancy, unspecified, unspecified trimester: Secondary | ICD-10-CM | POA: Insufficient documentation

## 2020-11-20 DIAGNOSIS — Z1331 Encounter for screening for depression: Secondary | ICD-10-CM

## 2020-11-20 MED ORDER — BLOOD PRESSURE KIT DEVI: 1.0000 | Freq: Once | 0 refills | Status: AC

## 2020-11-20 MED ORDER — DOXYLAMINE-PYRIDOXINE 10-10 MG PO TBEC: 2.0000 | DELAYED_RELEASE_TABLET | Freq: Every day | ORAL | 2 refills | Status: DC

## 2020-11-20 MED ORDER — GOJJI WEIGHT SCALE MISC: 1.0000 | Freq: Once | 0 refills | Status: AC

## 2020-11-20 NOTE — Progress Notes (Signed)
New OB Intake  I connected with  [name] on 11/20/20 at 0830 by MyChart Video Visit and verified that I am speaking with the correct person using two identifiers. Nurse is located at [facility] and pt is located at home.  I discussed the limitations, risks, security and privacy concerns of performing an evaluation and management service by telephone and the availability of in person appointments. I also discussed with the patient that there may be a patient responsible charge related to this service. The patient expressed understanding and agreed to proceed.  I explained I am completing New OB Intake today. We discussed her EDD of 05/30/21 that is based on LMP of 08/23/20. Pt is G7/P4024. I reviewed her allergies, medications, Medical/Surgical/OB history, and appropriate screenings. I informed her of [facility] services. Patient would like to follow up with Asher Muir, [facility] Based on history, this is a low risk pregnancy.  Patient Active Problem List   Diagnosis Date Noted   Supervision of low-risk pregnancy, unspecified trimester 11/20/2020   History of COVID-19 08/23/2019   PTSD (post-traumatic stress disorder)    Anxiety    Depression    Herpes genitalia    Fibromyalgia 04/09/2016   Mental disorders of mother, antepartum 09/13/2012    Concerns addressed today Nausea; pt reports continued nausea and vomiting while taking Reglan, Pepcid, and Zofran as scheduled. Pt reports no BM since visit to [facility]. Diclegis ordered per protocol. Recommended patient add Diclegis and try to lengthen time between Zofran doses. Also instructed pt to continue daily Miralax until constipation resolves.  Delivery Plans:  Plans to deliver at [facility].   MyChart/Babyscripts MyChart access verified. I explained pt will have some visits in office and some virtually. Babyscripts instructions given and order placed. Patient verifies receipt of registration text/e-mail. Account successfully created and app  downloaded.  Blood Pressure Cuff  Blood pressure cuff ordered for patient to pick-up from [employer]. Explained after first prenatal appt pt will check weekly and document in Babyscripts.  Weight scale: Weight scale ordered for patient to pick up form [facility].   Anatomy US Explained first scheduled [place] will be around 19 weeks. Anatomy US scheduled for 01/03/21.  Labs Discussed Avelina Laine genetic screening with patient, would like drawn at new ob appt. Routine prenatal labs needed.  COVID Vaccine Patient has not had COVID vaccine.   Mother/ Baby Dyad Candidate?    No  Social Determinants of Health Food Insecurity: Patient expresses food insecurity. Food Market information given to patient; explained patient may visit at the end of first OB appointment. WIC Referral: Patient is interested in referral to [facility].  Transportation: Patient denies transportation needs. Childcare: Discussed no children allowed at ultrasound appointments. Offered childcare services; patient declines childcare services at this time.  First visit review I reviewed new OB appt with pt. I explained she will have a provider visit with OB blood work. Explained pt will be seen by Ephriam Jenkins, MD at first visit; encounter routed to appropriate provider. Patient requests to see CNMs for following visits. Explained that patient will be seen by pregnancy navigator following visit with provider.  Marjo Bicker, RN 11/20/2020  8:35 AM

## 2020-11-21 ENCOUNTER — Telehealth: Payer: Medicaid Other

## 2020-11-21 MED ORDER — METOCLOPRAMIDE HCL 5 MG PO TABS: 5.0000 mg | ORAL_TABLET | Freq: Three times a day (TID) | ORAL | 0 refills | Status: DC | PRN

## 2020-11-21 MED ORDER — FAMOTIDINE 20 MG PO TABS: 20.0000 mg | ORAL_TABLET | Freq: Two times a day (BID) | ORAL | 0 refills | Status: DC

## 2020-11-21 NOTE — Addendum Note (Signed)
Addended by: Maxwell Marion E on: 11/21/2020 12:01 PM   Modules accepted: Orders

## 2020-11-22 NOTE — BH Specialist Note (Deleted)
Integrated Behavioral Health via Telemedicine Visit  11/22/2020 Breanna [name] [id number]  Number of Integrated Behavioral Health visits: *** Session Start time: 9:45***  Session End time: 10:45*** Total time: {IBH Total TMHD:62229798}  Referring Provider: *** Patient/Family location: Home*** [facility] Provider location: [facility]  All persons participating in visit: Patient *** and [facility] Medora Roorda ***  Types of Service: {CHL AMB TYPE OF SERVICE:[phone]}  I connected with Breanna [name] and/or Breanna [name]'s {family members:20773} via  Telephone or Engineer, civil (consulting)  (Video is Caregility application) and verified that I am speaking with the correct person using two identifiers. Discussed confidentiality: {YES/NO:21197}  I discussed the limitations of telemedicine and the availability of in person appointments.  Discussed there is a possibility of technology failure and discussed alternative modes of communication if that failure occurs.  I discussed that engaging in this telemedicine visit, they consent to the provision of behavioral healthcare and the services will be billed under their insurance.  Patient and/or legal guardian expressed understanding and consented to Telemedicine visit: {YES/NO:21197}  Presenting Concerns: Patient and/or family reports the following symptoms/concerns: *** Duration of problem: ***; Severity of problem: {Mild/Moderate/Severe:20260}  Patient and/or Family's Strengths/Protective Factors: {CHL AMB BH PROTECTIVE FACTORS:[id number]}  Goals Addressed: Patient will:  Reduce symptoms of: {IBH Symptoms:21014056}   Increase knowledge and/or ability of: {IBH Patient Tools:21014057}   Demonstrate ability to: {IBH Goals:21014053}  Progress towards Goals: {CHL AMB BH PROGRESS TOWARDS [phone]}  Interventions: Interventions utilized:  {IBH  Interventions:21014054} Standardized Assessments completed: {IBH Screening Tools:21014051}  Patient and/or Family Response: ***  Assessment: Patient currently experiencing ***.   Patient may benefit from ***.  Plan: Follow up with behavioral health clinician on : *** Behavioral recommendations: *** Referral(s): {IBH Referrals:21014055}  I discussed the assessment and treatment plan with the patient and/or parent/guardian. They were provided an opportunity to ask questions and all were answered. They agreed with the plan and demonstrated an understanding of the instructions.   They were advised to call back or seek an in-person evaluation if the symptoms worsen or if the condition fails to improve as anticipated.  [name] Malaia Buchta, LCSW

## 2020-12-09 ENCOUNTER — Ambulatory Visit (INDEPENDENT_AMBULATORY_CARE_PROVIDER_SITE_OTHER): Payer: Medicaid Other | Admitting: Family Medicine

## 2020-12-09 ENCOUNTER — Other Ambulatory Visit: Payer: Self-pay

## 2020-12-09 VITALS — BP 107/75 | HR 88 | Wt 144.5 lb

## 2020-12-09 DIAGNOSIS — Z3490 Encounter for supervision of normal pregnancy, unspecified, unspecified trimester: Secondary | ICD-10-CM

## 2020-12-09 DIAGNOSIS — F419 Anxiety disorder, unspecified: Secondary | ICD-10-CM

## 2020-12-09 DIAGNOSIS — F32A Depression, unspecified: Secondary | ICD-10-CM

## 2020-12-09 DIAGNOSIS — Z3A15 15 weeks gestation of pregnancy: Secondary | ICD-10-CM

## 2020-12-09 MED ORDER — DOCUSATE SODIUM 100 MG PO CAPS: 100.0000 mg | ORAL_CAPSULE | Freq: Two times a day (BID) | ORAL | 0 refills | Status: DC

## 2020-12-09 NOTE — Progress Notes (Signed)
Subjective:   Breanna [name] is a 35 y.o. [id number] at [redacted]w[redacted]d by LMP being seen today for her first obstetrical visit.  Her obstetrical history is significant for  four prior vaginal deliveries. Most recently in 07/2019 . Patient does intend to breast feed. Pregnancy history fully reviewed.  #Nausea/vomiting - reports has been having nausea and vomiting throughout current pregnancy - vomiting has improved as of 2 weeks ago - nausea continues - on reglan every morning - stopped Zofran because of constipation - She stopped Diclegis because room spinning - although vomiting has stopped nausea has been debilitating - appetite is not good - only eating breakfast and lunch most days - interested in outpatient Zofran and LR infusions -   #Constipation - taking Miralax previously but stopped - Last BM 2.5 weeks ago - passing gas every day - prune juice didn't work - occasionally eating dates - appedenctomy in 2017  - Discussed restarting Miralax daily and doing a short course of Colace and patient wants to try  #Depression Long standing hx of depression and anxiety Was previously on medication but does not want to take while pregnant Today PHQ9 elevated to severe MDD and GAD7 also elevated Denies thoughts of harming self or others Previously had appt with [facility] Asher Muir) but unable to keep appt due to her vomiting Would like to reschedule Offered to see if Asher Muir available today for check in but patient reports needs to go so would like to set up telemed appt instead  Patient reports nausea.  HISTORY: OB History  Gravida Para Term Preterm AB Living  7 4 4  0 2 4  SAB IAB Ectopic Multiple Live Births  1 1 0 0 4    # Outcome Date GA Lbr Len/2nd Weight Sex Delivery Anes PTL Lv  7 Current           6 Term 07/19/19 [redacted]w[redacted]d 05:12 / 00:08 6 lb 15.3 oz (3.155 kg) M Vag-Spont EPI  LIV     Name: Wence,BOY Kalila     Apgar1: 8  Apgar5: 9  5 Term 09/25/12 [redacted]w[redacted]d  7 lb 10.8 oz (3.481 kg) M  Vag-Spont None  LIV     Birth Comments: within normal limits; except was twin pregnancy and one twin vanished early in pregnancy     Name: Keo,BOY Yailine     Apgar1: 8  Apgar5: 9  4 Term 06/2010 [redacted]w[redacted]d 06:00 7 lb 11 oz (3.487 kg) M Vag-Spont EPI  LIV     Birth Comments: no complications  3 SAB 2011             Birth Comments: worked at [employer] for [employer], had + pregnancy test, then not pregnant  2 IAB 2007          1 Term 07/2004 [redacted]w[redacted]d 13:00 7 lb 8 oz (3.402 kg) M Vag-Spont EPI  LIV     Birth Comments: oligo   Last pap smear was  12/2018 and was normal Past Medical History:  Diagnosis Date   Anemia    Anxiety    BV (bacterial vaginosis)    Depression    Herpes genitalia    History of postpartum depression, currently pregnant    PTSD (post-traumatic stress disorder)    UTI (lower urinary tract infection)    Past Surgical History:  Procedure Laterality Date   APPENDECTOMY  2018   MOUTH SURGERY     RHINOPLASTY     Family History  Problem Relation Age of  Onset   Healthy Mother    Healthy Father    Thyroid disease Maternal Grandmother    Bipolar disorder Maternal Grandmother    Diabetes Paternal Grandmother    Heart disease Paternal Grandmother    Hypertension Paternal Grandmother    Alcohol abuse Neg Hx    Arthritis Neg Hx    Asthma Neg Hx    Birth defects Neg Hx    Cancer Neg Hx    COPD Neg Hx    Depression Neg Hx    Drug abuse Neg Hx    Early death Neg Hx    Hearing loss Neg Hx    Hyperlipidemia Neg Hx    Learning disabilities Neg Hx    Kidney disease Neg Hx    Mental illness Neg Hx    Mental retardation Neg Hx    Miscarriages / Stillbirths Neg Hx    Stroke Neg Hx    Vision loss Neg Hx    Social History   Tobacco Use   Smoking status: Former    Types: Cigarettes    Quit date: 02/17/2012    Years since quitting: 8.8   Smokeless tobacco: Never  Vaping Use   Vaping Use: Never used  Substance Use Topics   Alcohol use: Not Currently    Comment:  ocasionally   Drug use: No   Allergies  Allergen Reactions   Latex Itching    Vaginal irritation   Scopolamine Rash   Tape Rash   Current Outpatient Medications on File Prior to Visit  Medication Sig Dispense Refill   calcium carbonate (TUMS EX) 750 MG chewable tablet Chew 1 tablet by mouth daily.     famotidine (PEPCID) 20 MG tablet Take 1 tablet (20 mg total) by mouth 2 (two) times daily. 60 tablet 0   metoCLOPramide (REGLAN) 5 MG tablet Take 1 tablet (5 mg total) by mouth every 8 (eight) hours as needed for nausea or refractory nausea / vomiting. 60 tablet 0   Doxylamine-Pyridoxine (DICLEGIS) 10-10 MG TBEC Take 2 tablets by mouth at bedtime. May add 1 tablet at breakfast and 1 tablet at lunch if needed. (Patient not taking: Reported on 12/09/2020) 100 tablet 2   ondansetron (ZOFRAN) 8 MG tablet Take 8 mg by mouth every 8 (eight) hours as needed for nausea or vomiting. (Patient not taking: Reported on 12/09/2020)     polyethylene glycol (MIRALAX) 17 g packet Take 17 g by mouth daily. (Patient not taking: Reported on 12/09/2020) 14 each 0   Prenatal MV & Min w/FA-DHA (PRENATAL ADULT GUMMY/DHA/FA PO) Take 1 tablet by mouth daily.  (Patient not taking: Reported on 12/09/2020)     promethazine (PHENERGAN) 25 MG tablet Take 1 tablet (25 mg total) by mouth every 6 (six) hours as needed for nausea or vomiting. (Patient not taking: No sig reported) 30 tablet 2   scopolamine (TRANSDERM-SCOP) 1 MG/3DAYS Place 1 patch (1.5 mg total) onto the skin every 3 (three) days. (Patient not taking: No sig reported) 10 patch 12   sertraline (ZOLOFT) 100 MG tablet Take 1 tablet (100 mg total) by mouth daily. 30 tablet 2   [DISCONTINUED] ferrous sulfate 325 (65 FE) MG tablet Take 1 tablet (325 mg total) by mouth daily with breakfast. (Patient not taking: Reported on 06/27/2015) 30 tablet 3   No current facility-administered medications on file prior to visit.     Exam   Vitals:   12/09/20 1017  BP: 107/75   Pulse: 88  Weight: 144 lb 8  oz (65.5 kg)   Fetal Heart Rate (bpm): 152  System: General: well-developed, well-nourished female in no acute distress   Skin: normal coloration and turgor, no rashes   Neurologic: oriented, normal   HEENT extraocular movement intact and sclera clear, anicteric   Mouth/Teeth mucous membranes moist   Neck supple and no masses   Cardiovascular: regular rate and rhythm   Respiratory:  no respiratory distress, normal breath sounds   Abdomen: soft, non-tender; bowel sounds normal     Assessment:   Pregnancy: [id number] Patient Active Problem List   Diagnosis Date Noted   Supervision of low-risk pregnancy, unspecified trimester 11/20/2020   History of COVID-19 08/23/2019   PTSD (post-traumatic stress disorder)    Anxiety    Depression    Herpes genitalia    Fibromyalgia 04/09/2016   Mental disorders of mother, antepartum 09/13/2012     Plan:  1. Supervision of low-risk pregnancy, unspecified trimester 2. [redacted] weeks gestation of pregnancy Fundal height and heart tones appropriate. Having nausea and vomiting and worsening depression symptoms (see separate problems below). Has also lost weight during this pregnancy in setting of low appetite and food intake  - initial labs sent - referral to food pantry placed - see below for plan for nausea  - follow up in 4 weeks  3.  Anxiety and depression Long standing hx of depression and anxiety Was previously on medication but does not want to take while pregnant She was on meds for a prior pregnancy which helped but is concerned it is correlated to developmental concerns for her child from that pregnancy Today PHQ9 elevated at level that gives dx of severe MDD and GAD7 also elevated Denies thoughts of harming self or others Previously had appt with [facility] Asher Muir) but unable to keep appt due to her vomiting Would like to reschedule Offered to see if Asher Muir available today for check in but patient reports needs to go  so would like to set up telemed appt instead - Discussed safety plan including her support system - discussed medications and reassured patient and we are here to support her and the importance of finding appropriate management for her symptoms as a component of helping her pregnancy be healthy - provided reinforcement that we are here to support her in her care as much as possible in whatever way she prefers (whether that is counseling, meds, or both) - at this time opts to reconnect with Asher Muir - appt made with Asher Muir tomorrow (10/25)   4. Nausea/Vomiting in pregnancy - reports has been having nausea and vomiting throughout current pregnancy - vomiting has improved as of 2 weeks ago - nausea continues - on reglan every morning - stopped Zofran because of constipation - She stopped Diclegis because room spinning - although vomiting has stopped nausea has been debilitating - appetite is not good - only eating breakfast and lunch most days - interested in outpatient Zofran and LR infusions Plan: IV infusions set up at infusion center (IV Zofran 4mg , LR 1000cc, Benadryl 12.5mg  two times a week for three weeks) Will continue Reglan at this time Follow up at next visit  5. Constipation Patient with hx of constipation that is worsened in pregnancy. Passing lots of gas per patient. Does have a hx of prior abdominal surgery (appendectomy) however given passing gas and normal bowel sounds at this time no evidence of SBO. Has recently stopped all constipation medications.  - will trial short course of colace - also restart Miralax daily - and  discussed drinking warm liquids  6. Weight loss 7. Poor appetite Patient with nausea and vomiting in this pregnancy as well as worsening depressing symptoms and poor appetite. Likely appetite is multifactorial in setting of above factors.  -See plan for treatment of constipation and nausea above.  -Also discussed adding in snacks in the evening if full meal  is not possible.  - Discussed protein packed snacks if possible to help  -Also discussed treatment for her depression which is likely contributing to symptoms -If continues to have symptoms and weight loss could consider meeting with nutritionist to discuss foods    Initial labs drawn. Continue prenatal vitamins. Genetic Screening discussed, First trimester screen, Quad screen, and NIPS: declined. Ultrasound discussed; fetal anatomic survey: ordered. Problem list reviewed and updated. The nature of [facility] with multiple MDs and other Advanced Practice Providers was explained to patient; also emphasized that residents, students are part of our team. Routine obstetric precautions reviewed. Return in about 4 weeks (around 01/06/2021) for LROB, CNM preferred .  Warner Mccreedy, MD, MPH OB Fellow, Faculty Practice

## 2020-12-09 NOTE — BH Specialist Note (Signed)
Integrated Behavioral Health via Telemedicine Visit  12/09/2020 [name] [id number]  Number of Integrated Behavioral Health visits: 1 Session Start time: 8:17  Session End time: 8:39 Total time:  22  Referring Provider: Warner Mccreedy, MD Patient/Family [facility] Provider location: [facility]  All persons participating in visit: Patient Breanna [name] and [facility] Logan Vegh   Types of Service: Individual psychotherapy and Video visit  I connected with Estera M Clipper and/or Antoninette M Benfer's  n/a  via  Telephone or Video Enabled Telemedicine Application  (Video is Caregility application) and verified that I am speaking with the correct person using two identifiers. Discussed confidentiality: Yes   I discussed the limitations of telemedicine and the availability of in person appointments.  Discussed there is a possibility of technology failure and discussed alternative modes of communication if that failure occurs.  I discussed that engaging in this telemedicine visit, they consent to the provision of behavioral healthcare and the services will be billed under their insurance.  Patient and/or legal guardian expressed understanding and consented to Telemedicine visit: Yes   Presenting Concerns: Patient and/or family reports the following symptoms/concerns: Increase in symptoms of anxiety and depression, with irritability and anger, along with ongoing nausea, heartburn, constipation, poor sleep quality, "brain fog", and financial stress while out of work; took Zoloft after last pregnancy to cope.  Duration of problem: Ongoing, with increase physical symptoms in pregnancy; Severity of problem: severe  Patient and/or Family's Strengths/Protective Factors: Sense of purpose  Goals Addressed: Patient will:  Reduce symptoms of: anxiety, depression, and stress   Increase knowledge and/or ability of: healthy habits    Demonstrate ability to: Increase healthy adjustment to current life circumstances  Progress towards Goals: Ongoing  Interventions: Interventions utilized:  Solution-Focused Strategies and Psychoeducation and/or Health Education Standardized Assessments completed:  PHQ9/GAD7 given in past two weeks  Patient and/or Family Response: Pt agrees with treatment plan  Assessment: Patient currently experiencing Major depressive disorder, recurrent,severe, without psychotic features and Anxiety disorder, unspecified  Patient may benefit from psychoeducation and brief therapeutic interventions regarding coping with symptoms of anxiety, depression, life stress .  Plan: Follow up with behavioral health clinician on : Three weeks; Call Jordynn Perrier at [phone], as needed. Behavioral recommendations:  -Begin taking Zoloft 25mg  as prescribed -Continue plan to take gummy prenatals, while nausea remains -Continue taking nausea medication; discuss with medical provider at upcoming medical visit -Accept referral to update [facility] -Continue plan to reapply for EBT -Continue plan to use [facility], as additional support Referral(s): Integrated [facility] (In Clinic)  I discussed the assessment and treatment plan with the patient and/or parent/guardian. They were provided an opportunity to ask questions and all were answered. They agreed with the plan and demonstrated an understanding of the instructions.   They were advised to call back or seek an in-person evaluation if the symptoms worsen or if the condition fails to improve as anticipated.  [employer], LCSW  Depression screen [facility] 2/9 12/09/2020 11/20/2020 08/31/2019 08/02/2019 07/18/2019  Decreased Interest 3 3 3 1 1   Down, Depressed, Hopeless 3 3 3  0 0  PHQ - 2 Score 6 6 6 1 1   Altered sleeping 3 3 3 1  0  Tired, decreased energy 3 3 3 3 1   Change in appetite 3 3 3 3  0  Feeling bad or failure about yourself  3 3 1  0 0  Trouble  concentrating 3 3 3  3 1  Moving slowly or fidgety/restless 3 3 3 3  0  Suicidal thoughts 0 0 0 0 0  PHQ-9 Score 24 24 22 14 3   Some recent data might be hidden   GAD 7 : Generalized Anxiety Score 12/09/2020 11/20/2020 08/31/2019 08/02/2019  Nervous, Anxious, on Edge 3 3 3 3   Control/stop worrying 3 3 3 3   Worry too much - different things 3 3 3 3   Trouble relaxing 3 3 3 3   Restless 3 3 3 1   Easily annoyed or irritable 1 3 3 3   Afraid - awful might happen 3 3 3 3   Total GAD 7 Score 19 21 21  19

## 2020-12-10 ENCOUNTER — Ambulatory Visit (INDEPENDENT_AMBULATORY_CARE_PROVIDER_SITE_OTHER): Payer: Medicaid Other | Admitting: Clinical

## 2020-12-10 DIAGNOSIS — F332 Major depressive disorder, recurrent severe without psychotic features: Secondary | ICD-10-CM | POA: Diagnosis not present

## 2020-12-10 DIAGNOSIS — Z658 Other specified problems related to psychosocial circumstances: Secondary | ICD-10-CM

## 2020-12-10 DIAGNOSIS — F419 Anxiety disorder, unspecified: Secondary | ICD-10-CM

## 2020-12-10 LAB — CBC/D/PLT+RPR+RH+ABO+RUBIGG...
Antibody Screen: NEGATIVE
Basophils Absolute: 0 10*3/uL (ref 0.0–0.2)
Basos: 0 %
EOS (ABSOLUTE): 0.1 10*3/uL (ref 0.0–0.4)
Eos: 2 %
HCV Ab: <0.1 s/co ratio (ref 0.0–0.9)
HIV Screen 4th Generation wRfx: NONREACTIVE
Hematocrit: 34.4 % (ref 34.0–46.6)
Hemoglobin: 11.8 g/dL (ref 11.1–15.9)
Hepatitis B Surface Ag: NEGATIVE
Immature Grans (Abs): 0 10*3/uL (ref 0.0–0.1)
Immature Granulocytes: 0 %
Lymphocytes Absolute: 1 10*3/uL (ref 0.7–3.1)
Lymphs: 16 %
MCH: 26 pg — ABNORMAL LOW (ref 26.6–33.0)
MCHC: 34.3 g/dL (ref 31.5–35.7)
MCV: 76 fL — ABNORMAL LOW (ref 79–97)
Monocytes Absolute: 0.4 10*3/uL (ref 0.1–0.9)
Monocytes: 6 %
Neutrophils Absolute: 4.6 10*3/uL (ref 1.4–7.0)
Neutrophils: 76 %
Platelets: 313 10*3/uL (ref 150–450)
RBC: 4.53 x10E6/uL (ref 3.77–5.28)
RDW: 14.6 % (ref 11.7–15.4)
RPR Ser Ql: NONREACTIVE
Rh Factor: POSITIVE
Rubella Antibodies, IGG: 13.3 index (ref >0.99)
WBC: 6.1 10*3/uL (ref 3.4–10.8)

## 2020-12-10 LAB — HEMOGLOBIN A1C
Est. average glucose Bld gHb Est-mCnc: 103 mg/dL
Hgb A1c MFr Bld: 5.2 % (ref 4.8–5.6)

## 2020-12-10 LAB — HCV INTERPRETATION

## 2020-12-10 NOTE — Patient Instructions (Signed)
[facility] at [facility] [address] [place], [place] [address] [phone] (main office) [phone] (Quenisha Lovins's office)   

## 2020-12-11 ENCOUNTER — Other Ambulatory Visit: Payer: Self-pay | Admitting: Family Medicine

## 2020-12-11 DIAGNOSIS — Z3490 Encounter for supervision of normal pregnancy, unspecified, unspecified trimester: Secondary | ICD-10-CM

## 2020-12-11 LAB — CULTURE, OB URINE

## 2020-12-11 LAB — URINE CULTURE, OB REFLEX

## 2020-12-23 NOTE — BH Specialist Note (Signed)
Integrated Behavioral Health via Telemedicine Visit  12/23/2020 Breanna [name] [id number]  Number of Integrated Behavioral Health visits: 2 Session Start time: 8:16  Session End time: 8:35 Total time:  13  Referring Provider: Warner Mccreedy, MD Patient/Family location: Home [facility] Provider location: Center for [facility]  All persons participating in visit: Patient Breanna [name] and Breanna [name]   Types of Service: Individual psychotherapy and Video visit  I connected with Breanna [name] and/or Breanna [name]'s  n/a  via  Telephone or Video Enabled Telemedicine Application  (Video is Caregility application) and verified that I am speaking with the correct person using two identifiers. Discussed confidentiality: Yes   I discussed the limitations of telemedicine and the availability of in person appointments.  Discussed there is a possibility of technology failure and discussed alternative modes of communication if that failure occurs.  I discussed that engaging in this telemedicine visit, they consent to the provision of behavioral healthcare and the services will be billed under their insurance.  Patient and/or legal guardian expressed understanding and consented to Telemedicine visit: Yes   Presenting Concerns: Patient and/or family reports the following symptoms/concerns: Started Zoloft 25mg  on Thursday, Friday ("restless, angry/jittery at night", forgot to take Saturday, took Sunday morning, again Sunday night, "dry heaving" Monday; also forgetfulness and lost 3lbs in current pregnancy; "broken, light sleep" at night, but naps during daytime.   Duration of problem: Past week; Severity of problem:  moderately sever e  Patient and/or Family's Strengths/Protective Factors: Sense of purpose  Goals Addressed: Patient will:  Reduce symptoms of: anxiety, depression, and stress   Increase knowledge and/or ability of: healthy habits    Demonstrate ability to: Improve medication compliance  Progress towards Goals: Ongoing  Interventions: Interventions utilized:  Medication Monitoring Standardized Assessments completed: Not Needed  Patient and/or Family Response: Pt agrees with treatment plan  Assessment: Patient currently experiencing Major depressive disorder, recurrent, severe, no psychotic features, Anxiety disorder, Psychosocial stress.   Patient may benefit from continued psychoeducation and brief therapeutic interventions regarding coping with symptoms of depression, anxiety, stress .  Plan: Follow up with behavioral health clinician on : Two weeks; Call Breanna [name] at [phone], as needed. Behavioral recommendations:  -Continue taking Zoloft as prescribed, same time every morning with breakfast. Set timer on phone as reminder. -Continue prioritizing routine sleeping, eating, and hydration in current pregnancy daily Referral(s): Integrated [phone] (In Clinic)  I discussed the assessment and treatment plan with the patient and/or parent/guardian. They were provided an opportunity to ask questions and all were answered. They agreed with the plan and demonstrated an understanding of the instructions.   They were advised to call back or seek an in-person evaluation if the symptoms worsen or if the condition fails to improve as anticipated.  [employer], LCSW  Depression screen [facility] 2/9 12/09/2020 11/20/2020 08/31/2019 08/02/2019 07/18/2019  Decreased Interest 3 3 3 1 1   Down, Depressed, Hopeless 3 3 3  0 0  PHQ - 2 Score 6 6 6 1 1   Altered sleeping 3 3 3 1  0  Tired, decreased energy 3 3 3 3 1   Change in appetite 3 3 3 3  0  Feeling bad or failure about yourself  3 3 1  0 0  Trouble concentrating 3 3 3 3 1   Moving slowly or fidgety/restless 3 3 3 3  0  Suicidal thoughts 0 0 0 0 0  PHQ-9 Score 24 24 22 14 3   Some  recent data might be hidden   GAD 7 : Generalized Anxiety Score 12/09/2020  11/20/2020 08/31/2019 08/02/2019  Nervous, Anxious, on Edge 3 3 3 3   Control/stop worrying 3 3 3 3   Worry too much - different things 3 3 3 3   Trouble relaxing 3 3 3 3   Restless 3 3 3 1   Easily annoyed or irritable 1 3 3 3   Afraid - awful might happen 3 3 3 3   Total GAD 7 Score 19 21 21  19

## 2020-12-24 ENCOUNTER — Telehealth: Payer: Self-pay | Admitting: Family Medicine

## 2020-12-24 MED ORDER — SERTRALINE HCL 25 MG PO TABS: 25.0000 mg | ORAL_TABLET | Freq: Every day | ORAL | 0 refills | Status: DC

## 2020-12-24 NOTE — Telephone Encounter (Signed)
Received message from BH specialist Jamie McMannes that patient would like to start Sertraline for her depression symptoms in addition to continuing therapy.   Patient was previously on Sertraline in previous pregnancy and felt it helped with her symptoms. During our initial prenatal visit for current pregnancy (on 10/24) patient wanted to hold off on starting medications. PHQ 9 elevated at visit (met criteria for severe MDD) and anxiety.   Called patient to discuss that given her severe depression symptoms and prior hx of benefit from Sertraline that it is appropriate to restart medication.   No answer on phone. Left voicemail. Will message clinical pool to call patient to discuss.   , MD, MPH OB Fellow, Faculty Practice  

## 2020-12-30 ENCOUNTER — Inpatient Hospital Stay (HOSPITAL_COMMUNITY): Admission: RE | Admit: 2020-12-30 | Payer: Medicaid Other | Source: Ambulatory Visit

## 2020-12-30 ENCOUNTER — Telehealth: Payer: Self-pay

## 2020-12-30 NOTE — Telephone Encounter (Addendum)
VM left by patient on nurse VM requesting a call back to discuss Zoloft. Reports eye pressure, blurry vision, and vomiting after starting Zoloft. Patient reports checking BP with home cuff. Result was lower than normal, but still within normal limits. Reviewed with [facility] who states common side effects are diarrhea and increased sleepiness. Called pt to follow up; VM left stating I am calling in response to VM.   Reports taking Zoloft for past 3 days. This morning reports waking up and vomiting. Has not had episode of vomiting in 2 weeks. Currently taking Reglan and Pepcid daily. Would not like any additional medication for nausea. Denies any symptoms of fever. Encouraged pt to monitor at home and call the office if she does not improve. Pt was scheduled for IV infusion appt (LR, benadryl, and Zofran) but was unable to go to appointment due to feeling poorly. Has been rescheduled to later this week, would like to see if this improves nausea prior to adding any additional med.

## 2020-12-31 ENCOUNTER — Ambulatory Visit (INDEPENDENT_AMBULATORY_CARE_PROVIDER_SITE_OTHER): Payer: Medicaid Other | Admitting: Clinical

## 2020-12-31 DIAGNOSIS — F332 Major depressive disorder, recurrent severe without psychotic features: Secondary | ICD-10-CM | POA: Diagnosis not present

## 2020-12-31 DIAGNOSIS — Z658 Other specified problems related to psychosocial circumstances: Secondary | ICD-10-CM

## 2020-12-31 DIAGNOSIS — F419 Anxiety disorder, unspecified: Secondary | ICD-10-CM

## 2021-01-01 NOTE — BH Specialist Note (Signed)
Integrated Behavioral Health via Telemedicine Visit  01/01/2021 BLAIKE NEWBURN [id number]  Number of Integrated Behavioral Health visits: 3 Session Start time: 10:20  Session End time: 10:38 Total time:  18  Referring Provider: Warner Mccreedy, MD Patient/Family location: Home [facility] Provider location: Center for [facility]  All persons participating in visit: Patient Amoni Scallan and [facility] Susan Arana   Types of Service: Individual psychotherapy and Video visit  I connected with Tylasia M Sans and/or Matty M Pingley's  n/a  via  Telephone or Video Enabled Telemedicine Application  (Video is Caregility application) and verified that I am speaking with the correct person using two identifiers. Discussed confidentiality: Yes   I discussed the limitations of telemedicine and the availability of in person appointments.  Discussed there is a possibility of technology failure and discussed alternative modes of communication if that failure occurs.  I discussed that engaging in this telemedicine visit, they consent to the provision of behavioral healthcare and the services will be billed under their insurance.  Patient and/or legal guardian expressed understanding and consented to Telemedicine visit: Yes   Presenting Concerns: Patient and/or family reports the following symptoms/concerns: No refills of both Pepcid and Reglan; is taking Wellbutrin as prescribed with mild increased busts of  energy (able to accomplish tasks previously unable to complete). Pt worries about running out of medication affecting her ability to function well.  Duration of problem: Current pregnancy increase; Severity of problem: severe  Patient and/or Family's Strengths/Protective Factors: Concrete supports in place (healthy food, safe environments, etc.), Sense of purpose, and Physical Health (exercise, healthy diet, medication compliance, etc.)  Goals  Addressed: Patient will:  Reduce symptoms of: anxiety, depression, and stress   Demonstrate ability to: Increase healthy adjustment to current life circumstances  Progress towards Goals: Ongoing  Interventions: Interventions utilized:  Medication Monitoring Standardized Assessments completed:  PHQ9/GAD7 given in past two weeks  Patient and/or Family Response: Pt agrees with treatment plan  Assessment: Patient currently experiencing Major depressive disorder, recurrent, severe, without psychotic features.   Patient may benefit from continued psychoeducation and brief therapeutic interventions regarding coping with symptoms of anxiety, depression, stress .  Plan: Follow up with behavioral health clinician on : One month; Call Morris Markham at [phone], as needed. Behavioral recommendations:  -Continue taking Wellbutrin as prescribed -Continue taking nausea medication as prescribed -Continue plan to have infusion on 01/20/21 at [facility] at 9:00am -Continue eliminating unnecessary household tasks (ex. Use paper plates) through holidays Referral(s): Integrated [employer] (In Clinic)  I discussed the assessment and treatment plan with the patient and/or parent/guardian. They were provided an opportunity to ask questions and all were answered. They agreed with the plan and demonstrated an understanding of the instructions.   They were advised to call back or seek an in-person evaluation if the symptoms worsen or if the condition fails to improve as anticipated.  Rae Lips, LCSW  Depression screen [facility] 2/9 01/03/2021 12/09/2020 11/20/2020 08/31/2019 08/02/2019  Decreased Interest 3 3 3 3 1   Down, Depressed, Hopeless 3 3 3 3  0  PHQ - 2 Score 6 6 6 6 1   Altered sleeping 3 3 3 3 1   Tired, decreased energy 3 3 3 3 3   Change in appetite 3 3 3 3 3   Feeling bad or failure about yourself  3 3 3 1  0  Trouble concentrating 3 3 3 3 3   Moving slowly or  fidgety/restless 3 3  3 3 3   Suicidal thoughts 0 0 0 0 0  PHQ-9 Score 24 24 24 22 14   Some recent data might be hidden   GAD 7 : Generalized Anxiety Score 01/03/2021 12/09/2020 11/20/2020 08/31/2019  Nervous, Anxious, on Edge 3 3 3 3   Control/stop worrying 3 3 3 3   Worry too much - different things 3 3 3 3   Trouble relaxing 3 3 3 3   Restless 3 3 3 3   Easily annoyed or irritable 3 1 3 3   Afraid - awful might happen 3 3 3 3   Total GAD 7 Score 21 19 21  21

## 2021-01-02 ENCOUNTER — Encounter (HOSPITAL_COMMUNITY)
Admission: RE | Admit: 2021-01-02 | Discharge: 2021-01-02 | Disposition: A | Discharge status: Home / Self Care | Payer: Medicaid Other | Source: Ambulatory Visit | Attending: Family Medicine | Admitting: Family Medicine

## 2021-01-02 DIAGNOSIS — O26892 Other specified pregnancy related conditions, second trimester: Secondary | ICD-10-CM | POA: Insufficient documentation

## 2021-01-02 DIAGNOSIS — Z3A19 19 weeks gestation of pregnancy: Secondary | ICD-10-CM | POA: Diagnosis not present

## 2021-01-02 DIAGNOSIS — O99342 Other mental disorders complicating pregnancy, second trimester: Secondary | ICD-10-CM | POA: Diagnosis not present

## 2021-01-02 DIAGNOSIS — F419 Anxiety disorder, unspecified: Secondary | ICD-10-CM | POA: Insufficient documentation

## 2021-01-02 DIAGNOSIS — K5903 Drug induced constipation: Secondary | ICD-10-CM | POA: Diagnosis not present

## 2021-01-02 DIAGNOSIS — O219 Vomiting of pregnancy, unspecified: Secondary | ICD-10-CM | POA: Insufficient documentation

## 2021-01-02 DIAGNOSIS — F32A Depression, unspecified: Secondary | ICD-10-CM | POA: Diagnosis not present

## 2021-01-02 MED ORDER — LACTATED RINGERS IV BOLUS
1000.0000 mL | INTRAVENOUS | Status: DC
  Administered 2021-01-02: 10:00:00 1000 mL via INTRAVENOUS

## 2021-01-02 MED ORDER — ONDANSETRON HCL 4 MG/2ML IJ SOLN
4.0000 mg | INTRAMUSCULAR | Status: DC
  Administered 2021-01-02: 10:00:00 4 mg via INTRAVENOUS

## 2021-01-02 MED ORDER — ONDANSETRON HCL 4 MG/2ML IJ SOLN
INTRAMUSCULAR | Status: AC
  Filled 2021-01-02: qty 2

## 2021-01-02 MED ORDER — DIPHENHYDRAMINE HCL 50 MG/ML IJ SOLN
12.5000 mg | INTRAMUSCULAR | Status: DC
  Administered 2021-01-02: 10:00:00 12.5 mg via INTRAVENOUS

## 2021-01-02 MED ORDER — DIPHENHYDRAMINE HCL 50 MG/ML IJ SOLN
INTRAMUSCULAR | Status: AC
  Filled 2021-01-02: qty 1

## 2021-01-03 ENCOUNTER — Ambulatory Visit (INDEPENDENT_AMBULATORY_CARE_PROVIDER_SITE_OTHER): Payer: Medicaid Other | Admitting: Certified Nurse Midwife

## 2021-01-03 ENCOUNTER — Other Ambulatory Visit: Payer: Self-pay

## 2021-01-03 ENCOUNTER — Ambulatory Visit: Payer: Medicaid Other | Attending: Family Medicine

## 2021-01-03 ENCOUNTER — Other Ambulatory Visit: Payer: Self-pay | Admitting: Family Medicine

## 2021-01-03 ENCOUNTER — Other Ambulatory Visit: Payer: Self-pay | Admitting: *Deleted

## 2021-01-03 VITALS — BP 103/71 | HR 76 | Wt 143.9 lb

## 2021-01-03 DIAGNOSIS — Z3490 Encounter for supervision of normal pregnancy, unspecified, unspecified trimester: Secondary | ICD-10-CM

## 2021-01-03 DIAGNOSIS — Z3A19 19 weeks gestation of pregnancy: Secondary | ICD-10-CM | POA: Diagnosis not present

## 2021-01-03 DIAGNOSIS — Z3492 Encounter for supervision of normal pregnancy, unspecified, second trimester: Secondary | ICD-10-CM

## 2021-01-03 DIAGNOSIS — F419 Anxiety disorder, unspecified: Secondary | ICD-10-CM

## 2021-01-03 DIAGNOSIS — O219 Vomiting of pregnancy, unspecified: Secondary | ICD-10-CM

## 2021-01-03 DIAGNOSIS — O09522 Supervision of elderly multigravida, second trimester: Secondary | ICD-10-CM | POA: Diagnosis not present

## 2021-01-03 DIAGNOSIS — K5903 Drug induced constipation: Secondary | ICD-10-CM

## 2021-01-03 DIAGNOSIS — R634 Abnormal weight loss: Secondary | ICD-10-CM

## 2021-01-03 DIAGNOSIS — Z363 Encounter for antenatal screening for malformations: Secondary | ICD-10-CM | POA: Insufficient documentation

## 2021-01-03 DIAGNOSIS — F32A Depression, unspecified: Secondary | ICD-10-CM

## 2021-01-03 DIAGNOSIS — O210 Mild hyperemesis gravidarum: Secondary | ICD-10-CM

## 2021-01-03 MED ORDER — BUPROPION HCL ER (XL) 150 MG PO TB24: 150.0000 mg | ORAL_TABLET | Freq: Every day | ORAL | 6 refills | Status: DC

## 2021-01-03 MED ORDER — MAGNESIUM OXIDE -MG SUPPLEMENT 200 MG PO TABS: 400.0000 mg | ORAL_TABLET | Freq: Every day | ORAL | 3 refills | Status: DC

## 2021-01-03 NOTE — Progress Notes (Signed)
PRENATAL VISIT NOTE  Subjective:  Breanna [name] is a 35 y.o. [id number] at [redacted]w[redacted]d being seen today for ongoing prenatal care.  She is currently monitored for the following issues for this low-risk pregnancy and has Mental disorders of mother, antepartum; Fibromyalgia; PTSD (post-traumatic stress disorder); Anxiety; Depression; Herpes genitalia; History of COVID-19; and Supervision of low-risk pregnancy, unspecified trimester on their problem list.  Patient reports heartburn, nausea, and anxiety, racing/intrusive thoughts, easily agitated and all of this makes her feel depressed. Has met with Roselyn Reef and started on Zoloft, which she has taken before. On it for the past week but has not noticed a difference. Has also been on Wellbutrin and that worked well for her, just hasn't tried it during pregnancy .  Contractions: Not present. Vag. Bleeding: None.  Movement: Present. Denies leaking of fluid.   The following portions of the patient's history were reviewed and updated as appropriate: allergies, current medications, past family history, past medical history, past social history, past surgical history and problem list.   Objective:   Vitals:   01/03/21 1100  BP: 103/71  Pulse: 76  Weight: 143 lb 14.4 oz (65.3 kg)    Fetal Status: Fetal Heart Rate (bpm): 162   Movement: Present     General:  Alert, oriented and cooperative. Patient is in no acute distress.  Skin: Skin is warm and dry. No rash noted.   Cardiovascular: Normal heart rate noted  Respiratory: Normal respiratory effort, no problems with respiration noted  Abdomen: Soft, gravid, appropriate for gestational age.  Pain/Pressure: Present     Pelvic: Cervical exam deferred        Extremities: Normal range of motion.  Edema: None  Mental Status: Normal mood and affect. Normal behavior. Normal judgment and thought content.   Assessment and Plan:  Pregnancy: [id number] at [redacted]w[redacted]d 1. Supervision of low-risk pregnancy, second trimester -  Starting to feel regular and vigorous fetal movement  2. [redacted] weeks gestation of pregnancy - Routine OB care  3. Anxiety and depression - HIGHLY overstimulated at home by kids, sounds, etc and has started sending her kids to stay with their grandmother because of it. Hates how she feels and how anxious/easily triggered she gets and this makes her depressed. While talking it is clear she has been battling hypersensory issues for years, along with racing/intrusive thoughts, etc. Thinks she may have ADHD that's gone undiagnosed. Validated her self-assessment and discussed a different way to manage her symptoms, since Wellbutrin is used for treating anx/dep r/t ADHD. She's been on it before and felt it worked well, wants to try that instead of Zoloft. - buPROPion (WELLBUTRIN XL) 150 MG 24 hr tablet; Take 1 tablet (150 mg total) by mouth daily.  Dispense: 30 tablet; Refill: 6  4. Nausea and vomiting during pregnancy - Not vomiting and able to eat but still feeling extremely nauseated all the time. Reglan is starting to work less. Felt better after the infusion for a few days. Zoloft works better but makes her very constipated.  5. Drug-induced constipation - Still only having a BM once a week. Discussed the possible correlation of her nausea with constipation and recommended nightly magnesium to help the constipation. - Magnesium Oxide (MAG-OXIDE) 200 MG TABS; Take 2 tablets (400 mg total) by mouth at bedtime. If that amount causes loose stools in the am, switch to $RemoveB'200mg'lxvRzFWA$  daily at bedtime.  Dispense: 60 tablet; Refill: 3  Preterm labor symptoms and general obstetric precautions including but not limited to  vaginal bleeding, contractions, leaking of fluid and fetal movement were reviewed in detail with the patient. Please refer to After Visit Summary for other counseling recommendations.   Return in about 4 weeks (around 01/31/2021) for IN-PERSON, LOB.  Future Appointments  Date Time Provider [place]  01/08/2021  9:00 AM MC[facility]-RM5 [facility] None  01/15/2021 10:15 AM [facility]-BEHAVIORAL HEALTH CLINICIAN [facility]  02/11/2021  2:30 PM [facility] NURSE [facility]  02/11/2021  2:45 PM W[facility] US5 W[facility]FCUS [place]    Gabriel Carina, CNM

## 2021-01-07 ENCOUNTER — Other Ambulatory Visit: Payer: Self-pay | Admitting: Obstetrics and Gynecology

## 2021-01-07 DIAGNOSIS — Z3490 Encounter for supervision of normal pregnancy, unspecified, unspecified trimester: Secondary | ICD-10-CM

## 2021-01-07 DIAGNOSIS — O219 Vomiting of pregnancy, unspecified: Secondary | ICD-10-CM

## 2021-01-08 ENCOUNTER — Inpatient Hospital Stay (HOSPITAL_COMMUNITY): Admission: RE | Admit: 2021-01-08 | Payer: Medicaid Other | Source: Ambulatory Visit

## 2021-01-10 ENCOUNTER — Inpatient Hospital Stay (HOSPITAL_COMMUNITY): Admission: RE | Admit: 2021-01-10 | Payer: Medicaid Other | Source: Ambulatory Visit

## 2021-01-15 ENCOUNTER — Ambulatory Visit (INDEPENDENT_AMBULATORY_CARE_PROVIDER_SITE_OTHER): Payer: Medicaid Other | Admitting: Clinical

## 2021-01-15 DIAGNOSIS — F332 Major depressive disorder, recurrent severe without psychotic features: Secondary | ICD-10-CM

## 2021-01-16 ENCOUNTER — Other Ambulatory Visit: Payer: Self-pay | Admitting: Obstetrics and Gynecology

## 2021-01-16 DIAGNOSIS — Z3490 Encounter for supervision of normal pregnancy, unspecified, unspecified trimester: Secondary | ICD-10-CM

## 2021-01-16 DIAGNOSIS — O219 Vomiting of pregnancy, unspecified: Secondary | ICD-10-CM

## 2021-01-16 MED ORDER — FAMOTIDINE 20 MG PO TABS: 20.0000 mg | ORAL_TABLET | Freq: Two times a day (BID) | ORAL | 0 refills | Status: DC

## 2021-01-16 MED ORDER — METOCLOPRAMIDE HCL 5 MG PO TABS: 5.0000 mg | ORAL_TABLET | Freq: Three times a day (TID) | ORAL | 0 refills | Status: DC | PRN

## 2021-01-17 ENCOUNTER — Other Ambulatory Visit (HOSPITAL_COMMUNITY): Payer: Self-pay | Admitting: *Deleted

## 2021-01-20 ENCOUNTER — Encounter (HOSPITAL_COMMUNITY)
Admission: RE | Admit: 2021-01-20 | Discharge: 2021-01-20 | Disposition: A | Discharge status: Home / Self Care | Payer: Medicaid Other | Source: Ambulatory Visit | Attending: Family Medicine | Admitting: Family Medicine

## 2021-01-20 DIAGNOSIS — F32A Depression, unspecified: Secondary | ICD-10-CM | POA: Insufficient documentation

## 2021-01-20 DIAGNOSIS — F419 Anxiety disorder, unspecified: Secondary | ICD-10-CM | POA: Diagnosis not present

## 2021-01-20 DIAGNOSIS — Z3A19 19 weeks gestation of pregnancy: Secondary | ICD-10-CM | POA: Diagnosis not present

## 2021-01-20 DIAGNOSIS — O219 Vomiting of pregnancy, unspecified: Secondary | ICD-10-CM | POA: Insufficient documentation

## 2021-01-20 DIAGNOSIS — O99342 Other mental disorders complicating pregnancy, second trimester: Secondary | ICD-10-CM | POA: Diagnosis not present

## 2021-01-20 DIAGNOSIS — K5903 Drug induced constipation: Secondary | ICD-10-CM | POA: Diagnosis not present

## 2021-01-20 DIAGNOSIS — O26892 Other specified pregnancy related conditions, second trimester: Secondary | ICD-10-CM | POA: Diagnosis not present

## 2021-01-20 MED ORDER — LACTATED RINGERS IV BOLUS
1000.0000 mL | INTRAVENOUS | Status: DC
  Administered 2021-01-20: 1000 mL via INTRAVENOUS

## 2021-01-20 MED ORDER — ONDANSETRON HCL 4 MG/2ML IJ SOLN
4.0000 mg | INTRAMUSCULAR | Status: DC
  Administered 2021-01-20: 4 mg via INTRAVENOUS

## 2021-01-20 MED ORDER — DIPHENHYDRAMINE HCL 50 MG/ML IJ SOLN
12.5000 mg | INTRAMUSCULAR | Status: DC
  Administered 2021-01-20: 12.5 mg via INTRAVENOUS

## 2021-01-23 ENCOUNTER — Other Ambulatory Visit: Payer: Self-pay

## 2021-01-23 ENCOUNTER — Encounter (HOSPITAL_COMMUNITY)
Admission: RE | Admit: 2021-01-23 | Discharge: 2021-01-23 | Disposition: A | Discharge status: Home / Self Care | Payer: Medicaid Other | Source: Ambulatory Visit | Attending: Certified Nurse Midwife | Admitting: Certified Nurse Midwife

## 2021-01-23 DIAGNOSIS — O219 Vomiting of pregnancy, unspecified: Secondary | ICD-10-CM | POA: Diagnosis not present

## 2021-01-23 MED ORDER — ONDANSETRON HCL 4 MG/2ML IJ SOLN: 4.0000 mg | INTRAMUSCULAR | Status: DC

## 2021-01-23 MED ORDER — ONDANSETRON HCL 4 MG/2ML IJ SOLN
INTRAMUSCULAR | Status: AC
  Administered 2021-01-23: 4 mg via INTRAVENOUS
  Filled 2021-01-23: qty 2

## 2021-01-23 MED ORDER — LACTATED RINGERS IV BOLUS
1000.0000 mL | INTRAVENOUS | Status: DC
  Administered 2021-01-23: 1000 mL via INTRAVENOUS

## 2021-01-23 MED ORDER — DIPHENHYDRAMINE HCL 50 MG/ML IJ SOLN: 12.5000 mg | INTRAMUSCULAR | Status: DC

## 2021-01-23 MED ORDER — DIPHENHYDRAMINE HCL 50 MG/ML IJ SOLN
INTRAMUSCULAR | Status: AC
  Administered 2021-01-23: 12.5 mg via INTRAVENOUS
  Filled 2021-01-23: qty 1

## 2021-01-27 ENCOUNTER — Inpatient Hospital Stay (HOSPITAL_COMMUNITY): Admission: RE | Admit: 2021-01-27 | Payer: Medicaid Other | Source: Ambulatory Visit

## 2021-01-29 ENCOUNTER — Telehealth: Payer: Self-pay

## 2021-01-29 ENCOUNTER — Ambulatory Visit (INDEPENDENT_AMBULATORY_CARE_PROVIDER_SITE_OTHER): Payer: Medicaid Other

## 2021-01-29 ENCOUNTER — Other Ambulatory Visit: Payer: Self-pay

## 2021-01-29 VITALS — BP 101/70 | HR 93 | Wt 144.8 lb

## 2021-01-29 DIAGNOSIS — Z3490 Encounter for supervision of normal pregnancy, unspecified, unspecified trimester: Secondary | ICD-10-CM

## 2021-01-29 NOTE — Telephone Encounter (Signed)
Patient called front office to report decreased fetal movement. Call transferred to clinical staff. Patient reports not feeling fetal movement overnight or this AM. Reviewed normal fetal movement at this gestational age with patient. Patient denies any pain or bleeding. Pt is concerned something is wrong and would like to go to [facility] but does not have child care. Offered FHR check in office this AM. Pt agreeable.

## 2021-01-29 NOTE — BH Specialist Note (Signed)
Integrated Behavioral Health via Telemedicine Visit  01/29/2021 Breanna [name] [id number]  Number of Integrated Behavioral Health visits: 5 Session Start time:10:28  Session End time: 10:42 Total time:  13  Referring Provider: Warner Mccreedy, MD Patient/Family [facility]: [facility] Provider location: Center for [facility]  All persons participating in visit: Patient Breanna [name] and [facility] Breanna [name]   Types of Service: Individual psychotherapy and Telephone visit  I connected with Breanna [name] and/or Breanna [name]'s  n/a  via  Telephone or Video Enabled Telemedicine Application  (Video is Caregility application) and verified that I am speaking with the correct person using two identifiers. Discussed confidentiality: Yes   I discussed the limitations of telemedicine and the availability of in person appointments.  Discussed there is a possibility of technology failure and discussed alternative modes of communication if that failure occurs.  I discussed that engaging in this telemedicine visit, they consent to the provision of behavioral healthcare and the services will be billed under their insurance.  Patient and/or legal guardian expressed understanding and consented to Telemedicine visit: Yes   Presenting Concerns: Patient and/or family reports the following symptoms/concerns: Taking Wellbutrin with no negative side effects; anxiety remains high leading to "not able to be around people much"; fatigue, poor appetite, lack of concentration, anxiety, worry, restlessness, irritability, dread, panic.  Duration of problem: Ongoing; Severity of problem: severe  Patient and/or Family's Strengths/Protective Factors: Concrete supports in place (healthy food, safe environments, etc.), Sense of purpose, and Physical Health (exercise, healthy diet, medication compliance, etc.)  Goals Addressed: Patient will:  Reduce symptoms of: anxiety,  depression, and stress   Progress towards Goals: Ongoing  Interventions: Interventions utilized:  Medication Monitoring and Supportive Reflection Standardized Assessments completed: GAD-7 and PHQ 9  Patient and/or Family Response: Pt agrees with treatment plan  Assessment: Patient currently experiencing Major depressive disorder, recurrent, severe, without psychotic features.   Patient may benefit from continued brief therapeutic interventions.  Plan: Follow up with behavioral health clinician on : Asher Muir will call to schedule follow up Behavioral recommendations:  -Continue taking Wellbutrin daily as prescribed; You will be notified with any change in [facility] medication -Continue to eliminate unnecesary household tasks; prioritize only the most important tasks of the day Referral(s): Integrated [employer] (In Clinic)  I discussed the assessment and treatment plan with the patient and/or parent/guardian. They were provided an opportunity to ask questions and all were answered. They agreed with the plan and demonstrated an understanding of the instructions.   They were advised to call back or seek an in-person evaluation if the symptoms worsen or if the condition fails to improve as anticipated.  Rae Lips, LCSW  Depression screen [facility] 2/9 02/12/2021 01/03/2021 12/09/2020 11/20/2020 08/31/2019  Decreased Interest 1 3 3 3 3   Down, Depressed, Hopeless 1 3 3 3 3   PHQ - 2 Score 2 6 6 6 6   Altered sleeping 1 3 3 3 3   Tired, decreased energy 3 3 3 3 3   Change in appetite 3 3 3 3 3   Feeling bad or failure about yourself  1 3 3 3 1   Trouble concentrating 3 3 3 3 3   Moving slowly or fidgety/restless 3 3 3 3 3   Suicidal thoughts - 0 0 0 0  PHQ-9 Score 16 24 24 24 22   Some recent data might be hidden   GAD 7 : Generalized Anxiety Score 02/12/2021 01/03/2021 12/09/2020 11/20/2020  Nervous, Anxious, on  Edge 3 3 3 3   Control/stop worrying 3 3 3 3   Worry too much -  different things 3 3 3 3   Trouble relaxing 3 3 3 3   Restless 3 3 3 3   Easily annoyed or irritable 3 3 1 3   Afraid - awful might happen 3 3 3 3   Total GAD 7 Score 21 21 19  21

## 2021-01-29 NOTE — Progress Notes (Signed)
Patient was assessed and managed by nursing staff during this encounter. I have reviewed the chart and agree with the documentation and plan.   Edd Arbour, MSN, CNM, [facility] 01/29/21 8:29 PM

## 2021-01-29 NOTE — Progress Notes (Signed)
Patient here for fetal heart rate check due to decreased fetal movement at 22w 5d pregnancy. Reports not feeling movement overnight or this morning. Reviewed normal fetal movement at this gestational age. Patient denies vaginal bleeding or abdominal pain today. FHR is 154 by doppler. Pt states she experienced irregular uterine contractions a few days ago. States she has cancelled last few infusions for nausea/vomiting because she felt worse following infusion. Encouraged good hydration. Explained that uterine irritability can be directly related to dehydration. Recommended pt contact the office if she is experiencing regular contractions every 10 minutes at any point. Pt denies any other concerns. Will follow up at [facility] visit on 02/04/21.  Fleet Contras RN 01/29/21

## 2021-01-31 ENCOUNTER — Ambulatory Visit (HOSPITAL_COMMUNITY): Payer: Medicaid Other

## 2021-02-04 ENCOUNTER — Encounter: Payer: Medicaid Other | Admitting: Student

## 2021-02-11 ENCOUNTER — Encounter: Payer: Self-pay | Admitting: *Deleted

## 2021-02-11 ENCOUNTER — Ambulatory Visit: Payer: Medicaid Other | Admitting: *Deleted

## 2021-02-11 ENCOUNTER — Other Ambulatory Visit: Payer: Self-pay

## 2021-02-11 ENCOUNTER — Ambulatory Visit: Payer: Medicaid Other | Attending: Obstetrics

## 2021-02-11 VITALS — BP 117/59 | HR 88

## 2021-02-11 DIAGNOSIS — O210 Mild hyperemesis gravidarum: Secondary | ICD-10-CM | POA: Insufficient documentation

## 2021-02-11 DIAGNOSIS — R634 Abnormal weight loss: Secondary | ICD-10-CM | POA: Insufficient documentation

## 2021-02-11 DIAGNOSIS — Z3490 Encounter for supervision of normal pregnancy, unspecified, unspecified trimester: Secondary | ICD-10-CM | POA: Diagnosis present

## 2021-02-11 DIAGNOSIS — Z3A24 24 weeks gestation of pregnancy: Secondary | ICD-10-CM | POA: Diagnosis not present

## 2021-02-11 DIAGNOSIS — O09522 Supervision of elderly multigravida, second trimester: Secondary | ICD-10-CM | POA: Diagnosis not present

## 2021-02-12 ENCOUNTER — Ambulatory Visit (INDEPENDENT_AMBULATORY_CARE_PROVIDER_SITE_OTHER): Payer: Medicaid Other | Admitting: Clinical

## 2021-02-12 DIAGNOSIS — F332 Major depressive disorder, recurrent severe without psychotic features: Secondary | ICD-10-CM | POA: Diagnosis not present

## 2021-02-16 NOTE — L&D Delivery Note (Addendum)
Vaginal Delivery Note ? ?Pre-procedure Diagnosis: SIUP @ [redacted]w[redacted]d ?Indications: 36 y.o. [id number] Estimated Date of Delivery: 05/30/21 here today for IUGR in 9%. Her pregnancy has been complicated by FGR, AMA, unwanted fertility, severe NVP, anxiety/depressive disorder, + HSV serology . Her labor course was uncomplicated.   ?Post-procedure Diagnosis: SIUP @ [redacted]w[redacted]d ; same ? ?Provider: Ardyth Harps, SNM; Camelia Eng, CNM ? ?Anesthesia: epidural ? ?Complications: none ? ?Delivery Estimated Blood Loss (EBL): 220  mL ? ?Transfusions: none ? ?Pathology: placenta discarded on l&d routinely ? ?Labor Events: ?Rupture date: 05/09/2021 , at 2:15 PM .  ?Rupture type: Artificial [2];Intact [6]  ?Fluid characteristic: Clear [1]  ?Interval from ROM to Delivery: 3h 56m ?Induction: Misoprostol [4];AROM [2];Pitocin [5]  ?Augmentation: None [1]  ?Sex: F ? ?Delivery Information for baby girl of Daniyla Gowens ?Time of Birth: 5:27 PM  ?Baby Weight:   ? ?APGARS One minute Five minutes ?Totals: 9  9   ?Newborn is SGA and well. Patient plans to breastfeed.   ? ?Procedure:  ? The patient was in semi fowlers position, draped in a routine fashion.   ?SVD of a viable female infant in cephalic presentation delivered spontaneously over an intact supported perineum. No nuchal cord, true knot present in proximal segment of cord. No dystocia. No meconium present. Nose and mouth suctioned with bulb; cord clamped and cut. The placenta was then delivered spontaneously and intact via Tomasa Blase; [facility]. The uterine fundus was firm after uterine massage and with a 300 ml bolus of 30 units of pitocin. Inspection revealed  an intact perineum with an external hemostatic labial abrasion  which did not require repair. Instrument, sponge, and needle counts were correct at the end of the procedure. ? ? ?Disposition: stable to transfer to [facility]  ? ? ?Electronically Signed By: ?Ardyth Harps, SNM ?05/09/21 5:48 PM   ? ? ?Attestation of CNM Supervision of midwife student:  Evaluation and management procedures were performed by the midwife studentunder my supervision. I was present, gloved, and immediately available for direct supervision, assistance and direction throughout this encounter. I also confirm that I have verified the information documented in the resident?s note, and that I have also personally reperformed the pertinent components of the physical exam and all of the medical decision making activities.  I have also made any necessary editorial changes. ? ? ?Brand Males, CNM ?05/09/2021 5:58 PM ? ? ?

## 2021-02-19 ENCOUNTER — Encounter: Payer: Medicaid Other | Admitting: Obstetrics and Gynecology

## 2021-02-21 ENCOUNTER — Ambulatory Visit (INDEPENDENT_AMBULATORY_CARE_PROVIDER_SITE_OTHER): Payer: Medicaid Other | Admitting: Obstetrics and Gynecology

## 2021-02-21 ENCOUNTER — Other Ambulatory Visit: Payer: Self-pay

## 2021-02-21 VITALS — BP 110/79 | HR 99 | Wt 148.2 lb

## 2021-02-21 DIAGNOSIS — O09529 Supervision of elderly multigravida, unspecified trimester: Secondary | ICD-10-CM

## 2021-02-21 DIAGNOSIS — A6000 Herpesviral infection of urogenital system, unspecified: Secondary | ICD-10-CM

## 2021-02-21 DIAGNOSIS — O0990 Supervision of high risk pregnancy, unspecified, unspecified trimester: Secondary | ICD-10-CM

## 2021-02-21 DIAGNOSIS — F419 Anxiety disorder, unspecified: Secondary | ICD-10-CM

## 2021-02-21 DIAGNOSIS — F32A Depression, unspecified: Secondary | ICD-10-CM

## 2021-02-21 MED ORDER — BLOOD GLUCOSE MONITOR KIT: PACK | 0 refills | Status: DC

## 2021-02-21 NOTE — Progress Notes (Signed)
° °  PRENATAL VISIT NOTE  Subjective:  Breanna [name] is a 36 y.o. [id number] at [redacted]w[redacted]d being seen today for ongoing prenatal care.  She is currently monitored for the following issues for this high-risk pregnancy and has Mental disorders of mother, antepartum; Fibromyalgia; PTSD (post-traumatic stress disorder); Anxiety; Depression; Herpes genitalia; History of COVID-19; Supervision of high risk pregnancy, antepartum; and Antepartum multigravida of advanced maternal age on their problem list.  Patient reports  stable GI s/s .  Contractions: Not present. Vag. Bleeding: None.  Movement: Present. Denies leaking of fluid.   The following portions of the patient's history were reviewed and updated as appropriate: allergies, current medications, past family history, past medical history, past social history, past surgical history and problem list.   Objective:   Vitals:   02/21/21 0853  BP: 110/79  Pulse: 99  Weight: 148 lb 3.2 oz (67.2 kg)    Fetal Status: Fetal Heart Rate (bpm): 148   Movement: Present     General:  Alert, oriented and cooperative. Patient is in no acute distress.  Skin: Skin is warm and dry. No rash noted.   Cardiovascular: Normal heart rate noted  Respiratory: Normal respiratory effort, no problems with respiration noted  Abdomen: Soft, gravid, appropriate for gestational age.  Pain/Pressure: Present     Pelvic: Cervical exam deferred        Extremities: Normal range of motion.  Edema: None  Mental Status: Normal mood and affect. Normal behavior. Normal judgment and thought content.   Assessment and Plan:  Pregnancy: [id number] at [redacted]w[redacted]d 1. Supervision of high risk pregnancy, antepartum Cbc, rpr, hiv nv Pt unsure if able to tolerate GTT nv. I gave her an Rx for meter, strips and lancets and told her to check it qid the week prior. If normal, I told her to repeat it in a month  Pt doing well on reglan and pepcid. 4lbs weight gain since last visit. Pt thinks IVF, phenergan  infusions were starting to be counterproductive and is no longer going.  F/u growth on 12/27 wnl on 16%, 641gm, ac 29%, afi wnl. Rpt growth in a month not scheduled. Will have RN do today  2. Antepartum multigravida of advanced maternal age No issues  3. Anxiety and depression Stable on wellbutrin  4. Genital herpes simplex, unspecified site Start ppx at 35-36wks  Preterm labor symptoms and general obstetric precautions including but not limited to vaginal bleeding, contractions, leaking of fluid and fetal movement were reviewed in detail with the patient. Please refer to After Visit Summary for other counseling recommendations.   Return in about 2 weeks (around 03/07/2021) for in person, low risk ob, with app.  No future appointments.  Aletha Halim, MD

## 2021-03-01 ENCOUNTER — Other Ambulatory Visit: Payer: Self-pay | Admitting: Family Medicine

## 2021-03-01 ENCOUNTER — Other Ambulatory Visit: Payer: Self-pay | Admitting: Obstetrics and Gynecology

## 2021-03-01 DIAGNOSIS — O219 Vomiting of pregnancy, unspecified: Secondary | ICD-10-CM

## 2021-03-01 DIAGNOSIS — Z3490 Encounter for supervision of normal pregnancy, unspecified, unspecified trimester: Secondary | ICD-10-CM

## 2021-03-07 ENCOUNTER — Ambulatory Visit (INDEPENDENT_AMBULATORY_CARE_PROVIDER_SITE_OTHER): Payer: Medicaid Other | Admitting: Family Medicine

## 2021-03-07 ENCOUNTER — Encounter: Payer: Self-pay | Admitting: Family Medicine

## 2021-03-07 ENCOUNTER — Other Ambulatory Visit: Payer: Self-pay

## 2021-03-07 VITALS — BP 111/72 | HR 93 | Wt 151.3 lb

## 2021-03-07 DIAGNOSIS — O09529 Supervision of elderly multigravida, unspecified trimester: Secondary | ICD-10-CM

## 2021-03-07 DIAGNOSIS — O0990 Supervision of high risk pregnancy, unspecified, unspecified trimester: Secondary | ICD-10-CM

## 2021-03-07 DIAGNOSIS — F32A Depression, unspecified: Secondary | ICD-10-CM

## 2021-03-07 DIAGNOSIS — A6000 Herpesviral infection of urogenital system, unspecified: Secondary | ICD-10-CM

## 2021-03-07 DIAGNOSIS — F419 Anxiety disorder, unspecified: Secondary | ICD-10-CM

## 2021-03-07 MED ORDER — BUPROPION HCL ER (XL) 150 MG PO TB24: 150.0000 mg | ORAL_TABLET | Freq: Two times a day (BID) | ORAL | 5 refills | Status: DC

## 2021-03-07 NOTE — Progress Notes (Signed)
° °  Subjective:  Breanna [name] is a 36 y.o. [id number] at [redacted]w[redacted]d being seen today for ongoing prenatal care.  She is currently monitored for the following issues for this high-risk pregnancy and has Mental disorders of mother, antepartum; Fibromyalgia; PTSD (post-traumatic stress disorder); Anxiety; Depression; Herpes genitalia; History of COVID-19; Supervision of high risk pregnancy, antepartum; and Antepartum multigravida of advanced maternal age on their problem list.  Patient reports  anxiety .  Contractions: Not present. Vag. Bleeding: None.  Movement: Present. Denies leaking of fluid.   The following portions of the patient's history were reviewed and updated as appropriate: allergies, current medications, past family history, past medical history, past social history, past surgical history and problem list. Problem list updated.  Objective:   Vitals:   03/07/21 1018  BP: 111/72  Pulse: 93  Weight: 151 lb 4.8 oz (68.6 kg)    Fetal Status: Fetal Heart Rate (bpm): 134   Movement: Present     General:  Alert, oriented and cooperative. Patient is in no acute distress.  Skin: Skin is warm and dry. No rash noted.   Cardiovascular: Normal heart rate noted  Respiratory: Normal respiratory effort, no problems with respiration noted  Abdomen: Soft, gravid, appropriate for gestational age. Pain/Pressure: Absent     Pelvic: Vag. Bleeding: None     Cervical exam deferred        Extremities: Normal range of motion.  Edema: None  Mental Status: Normal mood and affect. Normal behavior. Normal judgment and thought content.   Urinalysis:      Assessment and Plan:  Pregnancy: [id number] at [redacted]w[redacted]d  1. Supervision of high risk pregnancy, antepartum BP and FHR normal Husband planning on vasectomy but does not have appt even scheduled yet Discussed [place]ctomy program as a low cost option Also discussed BTL, went through procedure in detail, signed consent form with emphasis that it is  a consent form not a contract, she can still decline in the future but this will allow [place] to get it paid for through Medicaid Declined GTT, not checking sugars. Instructed on QID checks x1 week, review at next visit Will obtain 3rd trimester labs next visit and offer TDaP at that time  2. Genital herpes simplex, unspecified site Ppx at 36 weeks  3. Anxiety Increased anxiety, increase wellbutrin to 150mg  BID  4. Antepartum multigravida of advanced maternal age   Preterm labor symptoms and general obstetric precautions including but not limited to vaginal bleeding, contractions, leaking of fluid and fetal movement were reviewed in detail with the patient. Please refer to After Visit Summary for other counseling recommendations.  Return in 2 weeks (on 03/21/2021) for [facility], ob visit.   Clarnce Flock, MD

## 2021-03-07 NOTE — Patient Instructions (Signed)

## 2021-03-14 ENCOUNTER — Other Ambulatory Visit: Payer: Self-pay | Admitting: Obstetrics and Gynecology

## 2021-03-14 ENCOUNTER — Other Ambulatory Visit: Payer: Self-pay

## 2021-03-14 ENCOUNTER — Other Ambulatory Visit: Payer: Self-pay | Admitting: *Deleted

## 2021-03-14 ENCOUNTER — Encounter: Payer: Self-pay | Admitting: *Deleted

## 2021-03-14 ENCOUNTER — Ambulatory Visit: Payer: Medicaid Other | Attending: Obstetrics and Gynecology

## 2021-03-14 ENCOUNTER — Ambulatory Visit (HOSPITAL_BASED_OUTPATIENT_CLINIC_OR_DEPARTMENT_OTHER): Payer: Medicaid Other | Admitting: *Deleted

## 2021-03-14 ENCOUNTER — Ambulatory Visit: Payer: Medicaid Other | Admitting: *Deleted

## 2021-03-14 VITALS — BP 102/59 | HR 88

## 2021-03-14 DIAGNOSIS — O0990 Supervision of high risk pregnancy, unspecified, unspecified trimester: Secondary | ICD-10-CM | POA: Insufficient documentation

## 2021-03-14 DIAGNOSIS — O365930 Maternal care for other known or suspected poor fetal growth, third trimester, not applicable or unspecified: Secondary | ICD-10-CM | POA: Diagnosis present

## 2021-03-14 DIAGNOSIS — Z3A29 29 weeks gestation of pregnancy: Secondary | ICD-10-CM | POA: Diagnosis present

## 2021-03-14 DIAGNOSIS — O09523 Supervision of elderly multigravida, third trimester: Secondary | ICD-10-CM

## 2021-03-14 NOTE — Procedures (Signed)
Breanna [name] [birth year] [redacted]w[redacted]d  Fetus A Non-Stress Test Interpretation for 03/14/21  Indication: IUGR  Fetal Heart Rate A Mode: External Baseline Rate (A): 140 bpm Variability: Moderate Accelerations: 10 x 10 Decelerations: None Multiple birth?: No  Uterine Activity Mode: Palpation, Toco Contraction Frequency (min): none Resting Tone Palpated: Relaxed  Interpretation (Fetal Testing) Nonstress Test Interpretation: Reactive Overall Impression: Reassuring for gestational age Comments: Dr. Judeth Cornfield reviewed tracing

## 2021-03-20 ENCOUNTER — Encounter: Payer: Self-pay | Admitting: Obstetrics and Gynecology

## 2021-03-20 DIAGNOSIS — O365990 Maternal care for other known or suspected poor fetal growth, unspecified trimester, not applicable or unspecified: Secondary | ICD-10-CM | POA: Insufficient documentation

## 2021-03-21 ENCOUNTER — Other Ambulatory Visit: Payer: Self-pay

## 2021-03-21 ENCOUNTER — Ambulatory Visit: Payer: Medicaid Other | Admitting: *Deleted

## 2021-03-21 ENCOUNTER — Ambulatory Visit: Payer: Medicaid Other | Attending: Obstetrics and Gynecology

## 2021-03-21 ENCOUNTER — Ambulatory Visit (HOSPITAL_BASED_OUTPATIENT_CLINIC_OR_DEPARTMENT_OTHER): Payer: Medicaid Other | Admitting: *Deleted

## 2021-03-21 ENCOUNTER — Encounter: Payer: Self-pay | Admitting: *Deleted

## 2021-03-21 VITALS — BP 95/55 | HR 83

## 2021-03-21 DIAGNOSIS — O09529 Supervision of elderly multigravida, unspecified trimester: Secondary | ICD-10-CM

## 2021-03-21 DIAGNOSIS — Z3A30 30 weeks gestation of pregnancy: Secondary | ICD-10-CM | POA: Diagnosis not present

## 2021-03-21 DIAGNOSIS — O365930 Maternal care for other known or suspected poor fetal growth, third trimester, not applicable or unspecified: Secondary | ICD-10-CM

## 2021-03-21 DIAGNOSIS — O09523 Supervision of elderly multigravida, third trimester: Secondary | ICD-10-CM | POA: Diagnosis not present

## 2021-03-21 DIAGNOSIS — O0990 Supervision of high risk pregnancy, unspecified, unspecified trimester: Secondary | ICD-10-CM | POA: Insufficient documentation

## 2021-03-21 NOTE — Procedures (Signed)
Breanna [name] 1985/08/25 [redacted]w[redacted]d  Fetus A Non-Stress Test Interpretation for 03/21/21  Indication: IUGR  Fetal Heart Rate A Mode: External Baseline Rate (A): 150 bpm Variability: Minimal, Moderate Accelerations: 10 x 10 Decelerations: None Multiple birth?: No  Uterine Activity Mode: Palpation, Toco Contraction Frequency (min): none Resting Tone Palpated: Relaxed  Interpretation (Fetal Testing) Nonstress Test Interpretation: Reactive Overall Impression: Reassuring for gestational age Comments: Dr. Parke Poisson reviewed tracing

## 2021-03-24 ENCOUNTER — Encounter: Payer: Self-pay | Admitting: Obstetrics and Gynecology

## 2021-03-24 ENCOUNTER — Encounter: Payer: Medicaid Other | Admitting: Obstetrics and Gynecology

## 2021-03-24 ENCOUNTER — Other Ambulatory Visit: Payer: Self-pay

## 2021-03-24 ENCOUNTER — Ambulatory Visit (INDEPENDENT_AMBULATORY_CARE_PROVIDER_SITE_OTHER): Payer: Medicaid Other | Admitting: Obstetrics and Gynecology

## 2021-03-24 VITALS — BP 111/67 | HR 101

## 2021-03-24 DIAGNOSIS — O0990 Supervision of high risk pregnancy, unspecified, unspecified trimester: Secondary | ICD-10-CM

## 2021-03-24 DIAGNOSIS — Z8619 Personal history of other infectious and parasitic diseases: Secondary | ICD-10-CM

## 2021-03-24 DIAGNOSIS — O09529 Supervision of elderly multigravida, unspecified trimester: Secondary | ICD-10-CM

## 2021-03-24 DIAGNOSIS — Z3009 Encounter for other general counseling and advice on contraception: Secondary | ICD-10-CM

## 2021-03-24 DIAGNOSIS — O365930 Maternal care for other known or suspected poor fetal growth, third trimester, not applicable or unspecified: Secondary | ICD-10-CM

## 2021-03-24 NOTE — Progress Notes (Signed)
Subjective:  Breanna [name] is a 36 y.o. [id number] at [redacted]w[redacted]d being seen today for ongoing prenatal care.  She is currently monitored for the following issues for this high-risk pregnancy and has Mental disorders of mother, antepartum; Fibromyalgia; PTSD (post-traumatic stress disorder); Anxiety; Depression; History of COVID-19; Supervision of high risk pregnancy, antepartum; Antepartum multigravida of advanced maternal age; IUGR (intrauterine growth restriction) affecting care of mother; History of ELISA positive for HSV; and Unwanted fertility on their problem list.  Patient reports continued problems with N/V. Marland Kitchen  Contractions: Not present. Vag. Bleeding: None.  Movement: Present. Denies leaking of fluid.   The following portions of the patient's history were reviewed and updated as appropriate: allergies, current medications, past family history, past medical history, past social history, past surgical history and problem list. Problem list updated.  Objective:   Vitals:   03/24/21 0854  BP: 111/67  Pulse: (!) 101    Fetal Status: Fetal Heart Rate (bpm): 144 Fundal Height: 29 cm Movement: Present     General:  Alert, oriented and cooperative. Patient is in no acute distress.  Skin: Skin is warm and dry. No rash noted.   Cardiovascular: Normal heart rate noted  Respiratory: Normal respiratory effort, no problems with respiration noted  Abdomen: Soft, gravid, appropriate for gestational age. Pain/Pressure: Present     Pelvic:  Cervical exam deferred        Extremities: Normal range of motion.  Edema: None  Mental Status: Normal mood and affect. Normal behavior. Normal judgment and thought content.   Urinalysis:      Assessment and Plan:  Pregnancy: [id number] at [redacted]w[redacted]d  1. Supervision of high risk pregnancy, antepartum Stable Conitnue with Tx for N/V Pt not checking CBG's. Declines GTT  2. Antepartum multigravida of advanced maternal age Stable  3. Poor fetal growth affecting  management of mother in third trimester, single or unspecified fetus Serial growth scans and antenatal testing as per MFM  4. History of ELISA positive for HSV Prophylactic treatment at 36 weeks   5. Unwanted fertility BTL papers signed today  Preterm labor symptoms and general obstetric precautions including but not limited to vaginal bleeding, contractions, leaking of fluid and fetal movement were reviewed in detail with the patient. Please refer to After Visit Summary for other counseling recommendations.  No follow-ups on file.   Chancy Milroy, MD

## 2021-03-24 NOTE — Patient Instructions (Signed)

## 2021-03-28 ENCOUNTER — Ambulatory Visit: Payer: Medicaid Other | Attending: Obstetrics and Gynecology

## 2021-03-28 ENCOUNTER — Encounter: Payer: Self-pay | Admitting: *Deleted

## 2021-03-28 ENCOUNTER — Ambulatory Visit: Payer: Medicaid Other | Admitting: *Deleted

## 2021-03-28 ENCOUNTER — Other Ambulatory Visit: Payer: Self-pay | Admitting: *Deleted

## 2021-03-28 ENCOUNTER — Other Ambulatory Visit: Payer: Self-pay

## 2021-03-28 ENCOUNTER — Ambulatory Visit (HOSPITAL_BASED_OUTPATIENT_CLINIC_OR_DEPARTMENT_OTHER): Payer: Medicaid Other | Admitting: *Deleted

## 2021-03-28 VITALS — BP 113/74 | HR 118

## 2021-03-28 DIAGNOSIS — O09523 Supervision of elderly multigravida, third trimester: Secondary | ICD-10-CM | POA: Insufficient documentation

## 2021-03-28 DIAGNOSIS — O0990 Supervision of high risk pregnancy, unspecified, unspecified trimester: Secondary | ICD-10-CM | POA: Diagnosis present

## 2021-03-28 DIAGNOSIS — O09513 Supervision of elderly primigravida, third trimester: Secondary | ICD-10-CM | POA: Diagnosis present

## 2021-03-28 DIAGNOSIS — O365930 Maternal care for other known or suspected poor fetal growth, third trimester, not applicable or unspecified: Secondary | ICD-10-CM | POA: Diagnosis not present

## 2021-03-28 DIAGNOSIS — Z3A31 31 weeks gestation of pregnancy: Secondary | ICD-10-CM | POA: Diagnosis present

## 2021-03-28 DIAGNOSIS — O365931 Maternal care for other known or suspected poor fetal growth, third trimester, fetus 1: Secondary | ICD-10-CM

## 2021-03-28 NOTE — Procedures (Signed)
Breanna [name] 1985/04/18 [redacted]w[redacted]d  Fetus A Non-Stress Test Interpretation for 03/28/21  Indication: IUGR  Fetal Heart Rate A Mode: External Baseline Rate (A): 140 bpm Variability: Moderate Accelerations: 10 x 10 Decelerations: None  Uterine Activity Mode: Toco Contraction Frequency (min): none Resting Tone Palpated: Relaxed  Interpretation (Fetal Testing) Nonstress Test Interpretation: Reactive Overall Impression: Reassuring for gestational age Comments: tracing reviewed by Dr. Judeth Cornfield

## 2021-03-31 ENCOUNTER — Other Ambulatory Visit: Payer: Self-pay

## 2021-03-31 ENCOUNTER — Ambulatory Visit: Payer: Medicaid Other | Attending: Obstetrics and Gynecology | Admitting: *Deleted

## 2021-03-31 ENCOUNTER — Ambulatory Visit (HOSPITAL_BASED_OUTPATIENT_CLINIC_OR_DEPARTMENT_OTHER): Payer: Medicaid Other | Admitting: *Deleted

## 2021-03-31 VITALS — BP 103/63 | HR 102

## 2021-03-31 DIAGNOSIS — O365930 Maternal care for other known or suspected poor fetal growth, third trimester, not applicable or unspecified: Secondary | ICD-10-CM | POA: Insufficient documentation

## 2021-03-31 DIAGNOSIS — O09523 Supervision of elderly multigravida, third trimester: Secondary | ICD-10-CM | POA: Insufficient documentation

## 2021-03-31 DIAGNOSIS — Z3689 Encounter for other specified antenatal screening: Secondary | ICD-10-CM | POA: Insufficient documentation

## 2021-03-31 DIAGNOSIS — Z3A31 31 weeks gestation of pregnancy: Secondary | ICD-10-CM | POA: Insufficient documentation

## 2021-03-31 DIAGNOSIS — O0990 Supervision of high risk pregnancy, unspecified, unspecified trimester: Secondary | ICD-10-CM

## 2021-03-31 NOTE — Procedures (Signed)
AYANNAH GRUNERT 1985/04/02 [redacted]w[redacted]d  Fetus A Non-Stress Test Interpretation for 03/31/21  Indication: IUGR  Fetal Heart Rate A Mode: External Baseline Rate (A): 140 bpm Variability: Moderate Accelerations: 10 x 10 Decelerations: None Multiple birth?: No  Uterine Activity Mode: Palpation, Toco Contraction Frequency (min): None Resting Tone Palpated: Relaxed Resting Time: Adequate  Interpretation (Fetal Testing) Nonstress Test Interpretation: Reactive Overall Impression: Reassuring for gestational age Comments: Dr. Donalee Citrin reviewed tracing.

## 2021-04-04 ENCOUNTER — Ambulatory Visit: Payer: Medicaid Other | Admitting: *Deleted

## 2021-04-04 ENCOUNTER — Ambulatory Visit: Payer: Medicaid Other | Attending: Obstetrics and Gynecology

## 2021-04-04 ENCOUNTER — Encounter: Payer: Self-pay | Admitting: *Deleted

## 2021-04-04 ENCOUNTER — Other Ambulatory Visit: Payer: Self-pay

## 2021-04-04 VITALS — BP 108/60 | HR 94

## 2021-04-04 DIAGNOSIS — O09523 Supervision of elderly multigravida, third trimester: Secondary | ICD-10-CM | POA: Diagnosis not present

## 2021-04-04 DIAGNOSIS — O0990 Supervision of high risk pregnancy, unspecified, unspecified trimester: Secondary | ICD-10-CM

## 2021-04-04 DIAGNOSIS — O365930 Maternal care for other known or suspected poor fetal growth, third trimester, not applicable or unspecified: Secondary | ICD-10-CM

## 2021-04-04 DIAGNOSIS — Z3A32 32 weeks gestation of pregnancy: Secondary | ICD-10-CM

## 2021-04-04 NOTE — Procedures (Signed)
Breanna [name] Jun 24, 1985 [redacted]w[redacted]d  Fetus A Non-Stress Test Interpretation for 04/04/21  Indication: IUGR  Fetal Heart Rate A Mode: External Baseline Rate (A): 140 bpm Variability: Moderate Accelerations: 15 x 15 Decelerations: None Multiple birth?: No  Uterine Activity Mode: Palpation, Toco Contraction Frequency (min): none Resting Tone Palpated: Relaxed  Interpretation (Fetal Testing) Nonstress Test Interpretation: Reactive Overall Impression: Reassuring for gestational age Comments: Dr.Fang reviewed tracing

## 2021-04-10 ENCOUNTER — Other Ambulatory Visit: Payer: Self-pay

## 2021-04-10 ENCOUNTER — Ambulatory Visit (INDEPENDENT_AMBULATORY_CARE_PROVIDER_SITE_OTHER): Payer: Medicaid Other | Admitting: Obstetrics and Gynecology

## 2021-04-10 VITALS — BP 110/65 | HR 96 | Wt 150.0 lb

## 2021-04-10 DIAGNOSIS — F419 Anxiety disorder, unspecified: Secondary | ICD-10-CM

## 2021-04-10 DIAGNOSIS — O365930 Maternal care for other known or suspected poor fetal growth, third trimester, not applicable or unspecified: Secondary | ICD-10-CM

## 2021-04-10 DIAGNOSIS — Z3490 Encounter for supervision of normal pregnancy, unspecified, unspecified trimester: Secondary | ICD-10-CM

## 2021-04-10 DIAGNOSIS — O219 Vomiting of pregnancy, unspecified: Secondary | ICD-10-CM

## 2021-04-10 DIAGNOSIS — Z5941 Food insecurity: Secondary | ICD-10-CM

## 2021-04-10 DIAGNOSIS — O0990 Supervision of high risk pregnancy, unspecified, unspecified trimester: Secondary | ICD-10-CM

## 2021-04-10 DIAGNOSIS — F331 Major depressive disorder, recurrent, moderate: Secondary | ICD-10-CM

## 2021-04-10 DIAGNOSIS — O09529 Supervision of elderly multigravida, unspecified trimester: Secondary | ICD-10-CM

## 2021-04-10 DIAGNOSIS — Z3009 Encounter for other general counseling and advice on contraception: Secondary | ICD-10-CM

## 2021-04-10 DIAGNOSIS — Z8619 Personal history of other infectious and parasitic diseases: Secondary | ICD-10-CM

## 2021-04-10 MED ORDER — METOCLOPRAMIDE HCL 5 MG PO TABS: 5.0000 mg | ORAL_TABLET | Freq: Three times a day (TID) | ORAL | 0 refills | Status: DC | PRN

## 2021-04-10 MED ORDER — FAMOTIDINE 20 MG PO TABS: 20.0000 mg | ORAL_TABLET | Freq: Two times a day (BID) | ORAL | 0 refills | Status: DC

## 2021-04-10 NOTE — Progress Notes (Signed)
PRENATAL VISIT NOTE  Subjective:  Breanna [name] is a 36 y.o. [id number] at [redacted]w[redacted]d being seen today for ongoing prenatal care.  She is currently monitored for the following issues for this high-risk pregnancy and has Mental disorders of mother, antepartum; Fibromyalgia; PTSD (post-traumatic stress disorder); Anxiety; Depression; History of COVID-19; Supervision of high risk pregnancy, antepartum; Antepartum multigravida of advanced maternal age; IUGR (intrauterine growth restriction) affecting care of mother; History of ELISA positive for HSV; and Unwanted fertility on their problem list.  Patient reports no complaints.  Contractions: Not present. Vag. Bleeding: None.  Movement: Present. Denies leaking of fluid.   The following portions of the patient's history were reviewed and updated as appropriate: allergies, current medications, past family history, past medical history, past social history, past surgical history and problem list.   Objective:   Vitals:   04/10/21 0925  BP: 110/65  Pulse: 96  Weight: 150 lb (68 kg)    Fetal Status: Fetal Heart Rate (bpm): 145   Movement: Present     General:  Alert, oriented and cooperative. Patient is in no acute distress.  Skin: Skin is warm and dry. No rash noted.   Cardiovascular: Normal heart rate noted  Respiratory: Normal respiratory effort, no problems with respiration noted  Abdomen: Soft, gravid, appropriate for gestational age.  Pain/Pressure: Present     Pelvic: Cervical exam deferred        Extremities: Normal range of motion.  Edema: None  Mental Status: Normal mood and affect. Normal behavior. Normal judgment and thought content.   Assessment and Plan:  Pregnancy: [id number] at [redacted]w[redacted]d 1. Food insecurity - AMBULATORY REFERRAL TO [facility]  2. Supervision of high-risk pregnancy, unspecified trimester - metoCLOPramide (REGLAN) 5 MG tablet; Take 1 tablet (5 mg total) by mouth every 8 (eight) hours as needed for nausea or  refractory nausea / vomiting.  Dispense: 60 tablet; Refill: 0 - famotidine (PEPCID) 20 MG tablet; Take 1 tablet (20 mg total) by mouth 2 (two) times daily.  Dispense: 60 tablet; Refill: 0  3. Nausea and vomiting during pregnancy Weight stable. Refills sent in - metoCLOPramide (REGLAN) 5 MG tablet; Take 1 tablet (5 mg total) by mouth every 8 (eight) hours as needed for nausea or refractory nausea / vomiting.  Dispense: 60 tablet; Refill: 0 - famotidine (PEPCID) 20 MG tablet; Take 1 tablet (20 mg total) by mouth 2 (two) times daily.  Dispense: 60 tablet; Refill: 0  4. Supervision of high risk pregnancy, antepartum  5. Poor fetal growth affecting management of mother in third trimester, single or unspecified fetus Weekly dopplers tomorrow. Continue weekly testing. Next growth on 3/10. Likley pin down delivery date then 2/17: <6%, 1569gm, ac 7%, UA dopplers normal  6. Anxiety Stable on wellbutrin  7. Moderate episode of recurrent major depressive disorder (HCC)  8. Unwanted fertility BTL paper UTD  9. History of ELISA positive for HSV Start ppx next visit  10. Antepartum multigravida of advanced maternal age No issues  Preterm labor symptoms and general obstetric precautions including but not limited to vaginal bleeding, contractions, leaking of fluid and fetal movement were reviewed in detail with the patient. Please refer to After Visit Summary for other counseling recommendations.   Return in about 8 days (around 04/18/2021).  Future Appointments  Date Time Provider Department Center  04/11/2021 10:30 AM [facility] NURSE [facility]  04/11/2021 10:45 AM [facility]  04/17/2021  4:15 PM Briant Sites [facility]  04/18/2021  10:30 AM [facility] NURSE [facility]  04/18/2021 10:45 AM [facility]4 [facility]  04/25/2021  8:15 AM [facility] NURSE [facility]  04/25/2021  8:30 AM [facility]3 [facility]  04/25/2021 11:15 AM Griffin Basil, MD [facility]    Aletha Halim, MD

## 2021-04-10 NOTE — Progress Notes (Signed)
Patient has elevated PHQ9/GAD7. Per patient she is already seeing [facility] here at [facility].

## 2021-04-11 ENCOUNTER — Ambulatory Visit: Payer: Medicaid Other | Attending: Obstetrics and Gynecology

## 2021-04-11 ENCOUNTER — Ambulatory Visit: Payer: Medicaid Other | Admitting: *Deleted

## 2021-04-11 ENCOUNTER — Ambulatory Visit: Payer: Medicaid Other

## 2021-04-11 ENCOUNTER — Encounter: Payer: Self-pay | Admitting: *Deleted

## 2021-04-11 VITALS — BP 105/60 | HR 90

## 2021-04-11 DIAGNOSIS — O09513 Supervision of elderly primigravida, third trimester: Secondary | ICD-10-CM

## 2021-04-11 DIAGNOSIS — Z3A33 33 weeks gestation of pregnancy: Secondary | ICD-10-CM

## 2021-04-11 DIAGNOSIS — O0990 Supervision of high risk pregnancy, unspecified, unspecified trimester: Secondary | ICD-10-CM

## 2021-04-11 DIAGNOSIS — O365930 Maternal care for other known or suspected poor fetal growth, third trimester, not applicable or unspecified: Secondary | ICD-10-CM | POA: Diagnosis not present

## 2021-04-14 ENCOUNTER — Other Ambulatory Visit: Payer: Self-pay | Admitting: Obstetrics and Gynecology

## 2021-04-14 DIAGNOSIS — O365930 Maternal care for other known or suspected poor fetal growth, third trimester, not applicable or unspecified: Secondary | ICD-10-CM

## 2021-04-16 ENCOUNTER — Telehealth: Payer: Self-pay | Admitting: *Deleted

## 2021-04-16 NOTE — Telephone Encounter (Signed)
Received a voicemail from [place] from 04/15/21 at 2:05pm. She reports she was woken up from her sleep, states she felt like it was asthma attack , not sure- states may have been her reflux . States she would like to have a nurse to call her back and tell her if she needs to take her reflux med early or other suggestions.  ?Per chart review do not see that patient has went to hospital or sent messages  ?I called Francile and left a message I am returning her call and I hope she is well . I left message that she can call us back or send MyChart message if she still needs to talk with [place].  ?Nancy Fetter ?

## 2021-04-17 ENCOUNTER — Telehealth (INDEPENDENT_AMBULATORY_CARE_PROVIDER_SITE_OTHER): Payer: Medicaid Other | Admitting: Advanced Practice Midwife

## 2021-04-17 VITALS — BP 121/72 | HR 101 | Wt 149.2 lb

## 2021-04-17 DIAGNOSIS — O99613 Diseases of the digestive system complicating pregnancy, third trimester: Secondary | ICD-10-CM

## 2021-04-17 DIAGNOSIS — O365930 Maternal care for other known or suspected poor fetal growth, third trimester, not applicable or unspecified: Secondary | ICD-10-CM

## 2021-04-17 DIAGNOSIS — K219 Gastro-esophageal reflux disease without esophagitis: Secondary | ICD-10-CM

## 2021-04-17 DIAGNOSIS — O98313 Other infections with a predominantly sexual mode of transmission complicating pregnancy, third trimester: Secondary | ICD-10-CM

## 2021-04-17 DIAGNOSIS — Z3A33 33 weeks gestation of pregnancy: Secondary | ICD-10-CM

## 2021-04-17 DIAGNOSIS — O0990 Supervision of high risk pregnancy, unspecified, unspecified trimester: Secondary | ICD-10-CM

## 2021-04-17 DIAGNOSIS — O0993 Supervision of high risk pregnancy, unspecified, third trimester: Secondary | ICD-10-CM

## 2021-04-17 DIAGNOSIS — Z3009 Encounter for other general counseling and advice on contraception: Secondary | ICD-10-CM

## 2021-04-17 DIAGNOSIS — Z8619 Personal history of other infectious and parasitic diseases: Secondary | ICD-10-CM

## 2021-04-17 MED ORDER — VALACYCLOVIR HCL 500 MG PO TABS: 500.0000 mg | ORAL_TABLET | Freq: Two times a day (BID) | ORAL | 6 refills | Status: DC

## 2021-04-17 MED ORDER — OMEPRAZOLE MAGNESIUM 20 MG PO TBEC: 20.0000 mg | DELAYED_RELEASE_TABLET | Freq: Every day | ORAL | 0 refills | Status: DC

## 2021-04-17 NOTE — Progress Notes (Signed)
OBSTETRICS PRENATAL VIRTUAL VISIT ENCOUNTER NOTE  Provider location: [facility]   Patient location: Home  I connected with Breanna [name] on 04/17/21 at  4:15 PM EST by MyChart Video Encounter and verified that I am speaking with the correct person using two identifiers. I discussed the limitations, risks, security and privacy concerns of performing an evaluation and management service virtually and the availability of in person appointments. I also discussed with the patient that there may be a patient responsible charge related to this service. The patient expressed understanding and agreed to proceed. Subjective:  Breanna [name] is a 36 y.o. [id number] at [redacted]w[redacted]d being seen today for ongoing prenatal care.  She is currently monitored for the following issues for this high-risk pregnancy and has Mental disorders of mother, antepartum; Fibromyalgia; PTSD (post-traumatic stress disorder); Anxiety; Depression; History of COVID-19; Supervision of high risk pregnancy, antepartum; Antepartum multigravida of advanced maternal age; IUGR (intrauterine growth restriction) affecting care of mother; History of ELISA positive for HSV; and Unwanted fertility on their problem list.  Patient reports heartburn.  Contractions: Not present. Vag. Bleeding: None.  Movement: Present. Denies any leaking of fluid.   The following portions of the patient's history were reviewed and updated as appropriate: allergies, current medications, past family history, past medical history, past social history, past surgical history and problem list.   Objective:   Vitals:   04/17/21 1618  BP: 121/72  Pulse: (!) 101  Weight: 149 lb 3.2 oz (67.7 kg)    Fetal Status:     Movement: Present     General:  Alert, oriented and cooperative. Patient is in no acute distress.  Respiratory: Normal respiratory effort, no problems with respiration noted  Mental Status: Normal mood and  affect. Normal behavior. Normal judgment and thought content.  Rest of physical exam deferred due to type of encounter  Imaging: [place] MFM FETAL BPP W/NONSTRESS  Result Date: 04/04/2021 ----------------------------------------------------------------------  OBSTETRICS REPORT                       (Signed Final 04/04/2021 11:30 am) ---------------------------------------------------------------------- Patient Info  ID #:       [id number]                          D.O.B.:  Apr 28, 1985 (35 yrs)  Name:       Breanna [name]               Visit Date: 04/04/2021 10:01 am ---------------------------------------------------------------------- Performed By  Attending:        Johnell Comings MD         Referred By:      Renard Matter MD  Performed By:     Margaretann Loveless     [facility] ---------------------------------------------------------------------- Orders  #  Description                           Code        Ordered By  1  [place] MFM OB FOLLOW UP                   [id number]    RAVI SHANKAR  2  [place] MFM UA CORD DOPPLER                [id number]    RAVI SHANKAR  3  [place] MFM FETAL BPP                      16109.6     RAVI [facility]     W/NONSTRESS ----------------------------------------------------------------------  #  Order #                     Accession #                Episode #  1  [id number]                   [id number]                 [id number]  2  [id number]                   [id number]                 [id number]  3  [id number]                   [id number]                 [id number] ---------------------------------------------------------------------- Indications  Maternal care for known or suspected poor      O36.5930  fetal growth, third trimester, not applicable or  unspecified IUGR  Advanced  maternal age primigravida 9+,        O2.513  third trimester  Hyperemesis gravidarum                         O21.0  Herpes simplex virus (HSV)                     O98.519 B00.9  [redacted] weeks gestation of pregnancy                Z3A.32 ---------------------------------------------------------------------- Fetal Evaluation  Num Of Fetuses:         1  Fetal Heart Rate(bpm):  152  Cardiac Activity:       Observed  Presentation:           Cephalic  Placenta:               Anterior  P. Cord Insertion:      Previously Visualized  Amniotic Fluid  AFI FV:      Within normal limits  AFI Sum(cm)     %Tile       Largest Pocket(cm)  14.94  52          4.68  RUQ(cm)       RLQ(cm)       LUQ(cm)        LLQ(cm)  4.68          4.52          2.18           3.56 ---------------------------------------------------------------------- Biophysical Evaluation  Amniotic F.V:   Pocket => 2 cm             F. Tone:        Observed  F. Movement:    Observed                   N.S.T:          Reactive  F. Breathing:   Observed                   Score:          10/10 ---------------------------------------------------------------------- Biometry  BPD:      77.1  mm     G. Age:  31w 0d         14  %    CI:        71.86   %    70 - 86                                                          FL/HC:      20.0   %    19.1 - 21.3  HC:      289.5  mm     G. Age:  31w 6d         13  %    HC/AC:      1.11        0.96 - 1.17  AC:      259.9  mm     G. Age:  30w 1d          7  %    FL/BPD:     75.1   %    71 - 87  FL:       57.9  mm     G. Age:  30w 2d          5  %    FL/AC:      22.3   %    20 - 24  HUM:        50  mm     G. Age:  29w 2d        < 5  %  LV:        4.4  mm  Est. FW:    1569  gm      3 lb 7 oz      6  % ---------------------------------------------------------------------- OB History  Gravidity:    7         Term:   4         SAB:   1  TOP:          1 ---------------------------------------------------------------------- Gestational  Age  LMP:           32w 0d        Date:  08/23/20  EDD:   05/30/21  U/S Today:     30w 6d                                        EDD:   06/07/21  Best:          Milderd Meager 0d     Det. By:  LMP  (08/23/20)          EDD:   05/30/21 ---------------------------------------------------------------------- Anatomy  Cranium:               Appears normal         LVOT:                   Appears normal  Cavum:                 Previously seen        Aortic Arch:            Previously seen  Ventricles:            Appears normal         Ductal Arch:            Appears normal  Choroid Plexus:        Previously seen        Diaphragm:              Appears normal  Cerebellum:            Previously seen        Stomach:                Appears normal, left                                                                        sided  Posterior Fossa:       Previously seen        Abdomen:                Appears normal  Nuchal Fold:           Previously seen        Abdominal Wall:         Previously seen  Face:                  Orbits and profile     Cord Vessels:           Previously seen                         previously seen  Lips:                  Previously seen        Kidneys:                Appear normal  Palate:                Previously seen        Bladder:                Appears normal  Thoracic:  Previously seen        Spine:                  Previously seen  Heart:                 Appears normal         Upper Extremities:      Previously seen                         (4CH, axis, and                         situs)  RVOT:                  Appears normal         Lower Extremities:      Previously seen  Other:  Parents do not wish to know sex of fetus. Heels/feet, open hands/5th          digits, nasal bone, lenses, VC, 3VV and 3VTV prev visualized. ---------------------------------------------------------------------- Doppler - Fetal Vessels  Umbilical Artery   S/D     %tile      RI    %tile      PI    %tile             ADFV    RDFV   2.41       33    0.58       35    0.82       32               No      No ---------------------------------------------------------------------- Cervix Uterus Adnexa  Cervix  Not visualized (advanced GA >24wks)  Uterus  Normal shape and size.  Right Ovary  Within normal limits.  Left Ovary  Within normal limits.  Cul De Sac  No free fluid seen.  Adnexa  No adnexal mass visualized. ---------------------------------------------------------------------- Comments  This patient was seen for a follow up growth scan due to fetal  growth restriction. She denies any problems since her last  exam and reports feeling vigorous fetal movements  throughout the day.  On today's exam, the EFW measures at the 6th percentile for  her gestational age indicating fetal growth restriction.  The  fetus has grown over 1 pound over the past 3 weeks.  There  was normal amniotic fluid noted.  A biophysical profile performed today due to fetal growth  restriction was 10 out of 10 with a reactive NST.  Doppler studies of the umbilical arteries showed a normal  S/D ratio of 2.41.  There were no signs of absent or reversed  end-diastolic flow.  Due to fetal growth restriction, we will continue to follow her  with weekly fetal testing and umbilical artery Doppler studies.  Another BPP and umbilical artery Doppler study was  scheduled in 1 week. ----------------------------------------------------------------------                   Johnell Comings, MD Electronically Signed Final Report   04/04/2021 11:30 am ----------------------------------------------------------------------  [place] MFM FETAL BPP WO NON STRESS  Result Date: 04/15/2021 ----------------------------------------------------------------------  OBSTETRICS REPORT                    (Corrected Final 04/15/2021 08:38 am) ---------------------------------------------------------------------- Patient Info  ID #:       [id number]  D.O.B.:  [birth year]  (35 yrs)  Name:       Breanna [name]               Visit Date: 04/11/2021 11:14 am ---------------------------------------------------------------------- Performed By  Attending:        Lin Landsman      Referred By:      Warner Mccreedy MD                    MD  Performed By:     Emeline Darling BS,      [facility]                                     [facility] at                                                             [facility] ---------------------------------------------------------------------- Orders  #  Description                           Code        Ordered By  1  [place] MFM UA CORD DOPPLER                76820.02    RAVI SHANKAR  2  [place] MFM FETAL BPP WO NON               76819.01    RAVI [facility]     STRESS ----------------------------------------------------------------------  #  Order #                     Accession #                Episode #  1  [id number]                   [id number]                 [id number]  2  [id number]                   [id number]                 [id number] ---------------------------------------------------------------------- Indications  Maternal care for known or suspected poor      O36.5930  fetal growth, third trimester, not applicable or  unspecified IUGR  Advanced maternal age primigravida 70+,        O5.513  third trimester (35 yrs)  Hyperemesis gravidarum                         O21.0  Herpes simplex virus (HSV)                     O98.519 B00.9  [redacted] weeks gestation of pregnancy                Z3A.33 ---------------------------------------------------------------------- Fetal Evaluation  Num Of Fetuses:         1  Fetal Heart Rate(bpm):  147  Cardiac Activity:       Observed  Presentation:           Cephalic  Placenta:               Anterior  P. Cord Insertion:      Previously Visualized  Amniotic Fluid  AFI FV:      Within normal limits  AFI Sum(cm)      %Tile       Largest Pocket(cm)  11.2            27          4.4  RUQ(cm)       RLQ(cm)       LUQ(cm)        LLQ(cm)  4.4           2.3           2              2.5 ---------------------------------------------------------------------- Biophysical Evaluation  Amniotic F.V:   Pocket => 2 cm             F. Tone:        Observed  F. Movement:    Observed                   Score:          8/8  F. Breathing:   Observed ---------------------------------------------------------------------- OB History  Gravidity:    7         Term:   4         SAB:   1  TOP:          1 ---------------------------------------------------------------------- Gestational Age  LMP:           33w 0d        Date:  08/23/20                 EDD:   05/30/21  Best:          33w 0d     Det. By:  LMP  (08/23/20)          EDD:   05/30/21 ---------------------------------------------------------------------- Anatomy  Diaphragm:             Appears normal         Bladder:                Appears normal  Stomach:               Appears normal, left                         sided ---------------------------------------------------------------------- Doppler - Fetal Vessels  Umbilical Artery   S/D     %tile      RI    %tile      PI    %tile     PSV    ADFV    RDFV                                                     (  cm/s)   2.36       34    0.58       40    0.82       36     56.6      No      No ----------------------------------------------------------------------                    Sander Nephew, MD Electronically Signed Corrected Final Report  04/15/2021 08:38 am ----------------------------------------------------------------------  [place] MFM OB FOLLOW UP  Result Date: 04/04/2021 ----------------------------------------------------------------------  OBSTETRICS REPORT                       (Signed Final 04/04/2021 11:30 am) ---------------------------------------------------------------------- Patient Info  ID #:       [id number]                           D.O.B.:  April 29, 1985 (35 yrs)  Name:       Breanna [name]               Visit Date: 04/04/2021 10:01 am ---------------------------------------------------------------------- Performed By  Attending:        Johnell Comings MD         Referred By:      Renard Matter MD  Performed By:     Margaretann Loveless     [facility]                                     [facility] at                                                             [facility] ---------------------------------------------------------------------- Orders  #  Description                           Code        Ordered By  1  [place] MFM OB FOLLOW UP                   76816.01    RAVI SHANKAR  2  [place] MFM UA CORD DOPPLER                76820.02    RAVI SHANKAR  3  [place] MFM FETAL BPP                      [id number]     RAVI SHANKAR     W/NONSTRESS ----------------------------------------------------------------------  #  Order #  Accession #                Episode #  1  [id number]                   [id number]                 [id number]  2  [id number]                   [id number]                 [id number]  3  [id number]                   [id number]                 [id number] ---------------------------------------------------------------------- Indications  Maternal care for known or suspected poor      O36.5930  fetal growth, third trimester, not applicable or  unspecified IUGR  Advanced maternal age primigravida 55+,        O45.513  third trimester  Hyperemesis gravidarum                         O21.0  Herpes simplex virus (HSV)                     O98.519 B00.9  [redacted] weeks gestation of pregnancy                Z3A.32 ---------------------------------------------------------------------- Fetal Evaluation  Num Of Fetuses:         1  Fetal Heart Rate(bpm):  152  Cardiac Activity:       Observed  Presentation:           Cephalic  Placenta:                Anterior  P. Cord Insertion:      Previously Visualized  Amniotic Fluid  AFI FV:      Within normal limits  AFI Sum(cm)     %Tile       Largest Pocket(cm)  14.94           52          4.68  RUQ(cm)       RLQ(cm)       LUQ(cm)        LLQ(cm)  4.68          4.52          2.18           3.56 ---------------------------------------------------------------------- Biophysical Evaluation  Amniotic F.V:   Pocket => 2 cm             F. Tone:        Observed  F. Movement:    Observed                   N.S.T:          Reactive  F. Breathing:   Observed                   Score:          10/10 ---------------------------------------------------------------------- Biometry  BPD:      77.1  mm     G. Age:  31w 0d         14  %    CI:        71.86   %    70 -  86                                                          FL/HC:      20.0   %    19.1 - 21.3  HC:      289.5  mm     G. Age:  31w 6d         13  %    HC/AC:      1.11        0.96 - 1.17  AC:      259.9  mm     G. Age:  30w 1d          7  %    FL/BPD:     75.1   %    71 - 87  FL:       57.9  mm     G. Age:  30w 2d          5  %    FL/AC:      22.3   %    20 - 24  HUM:        50  mm     G. Age:  29w 2d        < 5  %  LV:        4.4  mm  Est. FW:    1569  gm      3 lb 7 oz      6  % ---------------------------------------------------------------------- OB History  Gravidity:    7         Term:   4         SAB:   1  TOP:          1 ---------------------------------------------------------------------- Gestational Age  LMP:           32w 0d        Date:  08/23/20                 EDD:   05/30/21  U/S Today:     30w 6d                                        EDD:   06/07/21  Best:          Milderd Meager 0d     Det. By:  LMP  (08/23/20)          EDD:   05/30/21 ---------------------------------------------------------------------- Anatomy  Cranium:               Appears normal         LVOT:                   Appears normal  Cavum:                 Previously seen        Aortic Arch:             Previously seen  Ventricles:            Appears normal         Ductal Arch:  Appears normal  Choroid Plexus:        Previously seen        Diaphragm:              Appears normal  Cerebellum:            Previously seen        Stomach:                Appears normal, left                                                                        sided  Posterior Fossa:       Previously seen        Abdomen:                Appears normal  Nuchal Fold:           Previously seen        Abdominal Wall:         Previously seen  Face:                  Orbits and profile     Cord Vessels:           Previously seen                         previously seen  Lips:                  Previously seen        Kidneys:                Appear normal  Palate:                Previously seen        Bladder:                Appears normal  Thoracic:              Previously seen        Spine:                  Previously seen  Heart:                 Appears normal         Upper Extremities:      Previously seen                         (4CH, axis, and                         situs)  RVOT:                  Appears normal         Lower Extremities:      Previously seen  Other:  Parents do not wish to know sex of fetus. Heels/feet, open hands/5th          digits, nasal bone, lenses, VC, 3VV and 3VTV prev visualized. ---------------------------------------------------------------------- Doppler - Fetal Vessels  Umbilical Artery   S/D     %tile  RI    %tile      PI    %tile            ADFV    RDFV   2.41       33    0.58       35    0.82       32               No      No ---------------------------------------------------------------------- Cervix Uterus Adnexa  Cervix  Not visualized (advanced GA >24wks)  Uterus  Normal shape and size.  Right Ovary  Within normal limits.  Left Ovary  Within normal limits.  Cul De Sac  No free fluid seen.  Adnexa  No adnexal mass visualized.  ---------------------------------------------------------------------- Comments  This patient was seen for a follow up growth scan due to fetal  growth restriction. She denies any problems since her last  exam and reports feeling vigorous fetal movements  throughout the day.  On today's exam, the EFW measures at the 6th percentile for  her gestational age indicating fetal growth restriction.  The  fetus has grown over 1 pound over the past 3 weeks.  There  was normal amniotic fluid noted.  A biophysical profile performed today due to fetal growth  restriction was 10 out of 10 with a reactive NST.  Doppler studies of the umbilical arteries showed a normal  S/D ratio of 2.41.  There were no signs of absent or reversed  end-diastolic flow.  Due to fetal growth restriction, we will continue to follow her  with weekly fetal testing and umbilical artery Doppler studies.  Another BPP and umbilical artery Doppler study was  scheduled in 1 week. ----------------------------------------------------------------------                   Johnell Comings, MD Electronically Signed Final Report   04/04/2021 11:30 am ----------------------------------------------------------------------  [place] MFM OB LIMITED  Result Date: 03/28/2021 ----------------------------------------------------------------------  OBSTETRICS REPORT                       (Signed Final 03/28/2021 10:38 am) ---------------------------------------------------------------------- Patient Info  ID #:       [id number]                          D.O.B.:  11-Feb-1986 (35 yrs)  Name:       Breanna [name]               Visit Date: 03/28/2021 08:03 am ---------------------------------------------------------------------- Performed By  Attending:        Tama High MD        Referred By:      Renard Matter MD  Performed By:     Eveline Keto         [facility]                                     [facility] ---------------------------------------------------------------------- Orders  #  Description                           Code        Ordered By  1  [place] MFM OB LIMITED                     [id number]    RAVI SHANKAR  2  [place] MFM UA CORD DOPPLER                [id number]    RAVI [name] ----------------------------------------------------------------------  #  Order #                     Accession #                Episode #  1  [id number]                   [id number]                 [id number]  2  [id number]                   [id number]                 [id number] ---------------------------------------------------------------------- Indications  [redacted] weeks gestation of pregnancy                Z3A.36  Maternal care for known or suspected poor      O36.5930  fetal growth, third trimester, not applicable or  unspecified IUGR  Advanced maternal age primigravida 30+,        O53.513  third trimester  Hyperemesis gravidarum                         O21.0  Herpes simplex virus (HSV)                     O98.519 B00.9 ---------------------------------------------------------------------- Fetal Evaluation  Num Of Fetuses:         1  Fetal Heart Rate(bpm):  147  Cardiac Activity:       Observed  Presentation:           Cephalic  Placenta:               Anterior  Amniotic Fluid  AFI FV:      Within normal limits  AFI Sum(cm)     %Tile       Largest Pocket(cm)  8.86            5           2.72  RUQ(cm)       RLQ(cm)       LUQ(cm)        LLQ(cm)  2.72          1.36          2.39           2.39 ---------------------------------------------------------------------- OB History  Gravidity:    7  Term:   4         SAB:   1  TOP:          1 ---------------------------------------------------------------------- Gestational Age  LMP:           31w 0d        Date:  08/23/20                 EDD:   05/30/21  Best:          Burke Keels 0d      Det. By:  LMP  (08/23/20)          EDD:   05/30/21 ---------------------------------------------------------------------- Anatomy  Cranium:               Previously seen        LVOT:                   Appears normal  Cavum:                 Previously seen        Aortic Arch:            Previously seen  Ventricles:            Previously seen        Ductal Arch:            Appears normal  Choroid Plexus:        Previously seen        Diaphragm:              Appears normal  Cerebellum:            Previously seen        Stomach:                Appears normal, left                                                                        sided  Posterior Fossa:       Previously seen        Abdomen:                Appears normal  Nuchal Fold:           Previously seen        Abdominal Wall:         Previously seen  Face:                  Orbits and profile     Cord Vessels:           Previously seen                         previously seen  Lips:                  Previously seen        Kidneys:                Appear normal  Palate:                Previously seen        Bladder:  Appears normal  Thoracic:              Previously seen        Spine:                  Previously seen  Heart:                 Appears normal         Upper Extremities:      Previously seen                         (4CH, axis, and                         situs)  RVOT:                  Appears normal         Lower Extremities:      Previously seen  Other:  Parents do not wish to know sex of fetus. Heels/feet, open hands/5th          digits, nasal bone, lenses, VC, 3VV and 3VTV prev visualized. ---------------------------------------------------------------------- Doppler - Fetal Vessels  Umbilical Artery   S/D     %tile      RI    %tile      PI    %tile   2.75       50    0.64       55    0.87       36 ---------------------------------------------------------------------- Impression  Severe fetal growth restriction.  On ultrasound performed  2  weeks ago, the estimated fetal weight was at the 3rd  percentile.  Amniotic fluid is normal and good fetal activity seen.  Umbilical artery Doppler showed normal forward diastolic  flow.  NST is reactive (confirmed on Obix and not on strip). ---------------------------------------------------------------------- Recommendations  -NST on 03/31/21  -BPP, NST and UA Doppler from next week.  -Fetal growth assessment next week. ----------------------------------------------------------------------                  Tama High, MD Electronically Signed Final Report   03/28/2021 10:38 am ----------------------------------------------------------------------  [place] MFM OB LIMITED  Result Date: 03/21/2021 ----------------------------------------------------------------------  OBSTETRICS REPORT                       (Signed Final 03/21/2021 09:58 am) ---------------------------------------------------------------------- Patient Info  ID #:       [id number]                          D.O.B.:  [birth year] (35 yrs)  Name:       Breanna [name]               Visit Date: 03/21/2021 07:26 am ---------------------------------------------------------------------- Performed By  Attending:        Johnell Comings MD         Referred By:      Renard Matter MD  Performed By:     Nathen May       [facility]                    RD[facility] at                                                             [place] for                                                             Women ---------------------------------------------------------------------- Orders  #  Description                           Code        Ordered By  1  [place] MFM OB LIMITED                     76815.01    RAVI SHANKAR  2  [place] MFM UA CORD DOPPLER                76820.02    RAVI [facility] ----------------------------------------------------------------------  #  Order #                     Accession #                 Episode #  1  [id number]                   [id number]                 [id number]  2  [id number]                   [id number]                 [id number] ---------------------------------------------------------------------- Indications  Maternal care for known or suspected poor      O36.5930  fetal growth, third trimester, not applicable or  unspecified IUGR  Advanced maternal age multigravida 33+,        O38.522  second trimester (36 yo)  Hyperemesis gravidarum                         O21.0  Herpes simplex virus (HSV)                     O98.519 B00.9  [redacted] weeks gestation of pregnancy                Z3A.30 ---------------------------------------------------------------------- Fetal Evaluation  Num Of Fetuses:         1  Fetal Heart Rate(bpm):  152  Cardiac Activity:       Observed  Presentation:           Breech  Placenta:               Anterior  P. Cord Insertion:      Previously Visualized  Amniotic Fluid  AFI FV:      Within normal limits  AFI Sum(cm)     %Tile       Largest Pocket(cm)  12.56           34  3.58  RUQ(cm)       RLQ(cm)       LUQ(cm)        LLQ(cm)  2.99          3.58          2.83           3.16 ---------------------------------------------------------------------- Biometry  LV:        4.3  mm ---------------------------------------------------------------------- OB History  Gravidity:    7         Term:   4         SAB:   1  TOP:          1 ---------------------------------------------------------------------- Gestational Age  LMP:           30w 0d        Date:  08/23/20                 EDD:   05/30/21  Best:          Georgiann Hahn 0d     Det. By:  LMP  (08/23/20)          EDD:   05/30/21 ---------------------------------------------------------------------- Doppler - Fetal Vessels  Umbilical Artery   S/D     %tile      RI    %tile      PI    %tile     PSV    ADFV    RDFV                                                     (cm/s)   2.63       37    0.62       40    0.82       22    45.25      No      No  ---------------------------------------------------------------------- Cervix Uterus Adnexa  Cervix  Length:           2.95  cm.  Normal appearance by transabdominal scan. ---------------------------------------------------------------------- Comments  This patient was seen due to an IUGR fetus.  She denies any  problems since her last exam.  She reports feeling vigorous  fetal movements throughout the day.  The patient had a reactive NST for her gestational age today.  There was normal amniotic fluid noted on today's ultrasound  exam.  Doppler studies of the umbilical arteries performed due to  fetal growth restriction showed a normal S/D ratio of 2.63.  There were no signs of absent or reversed end-diastolic flow  noted today.  She will return in 1 week for another NST and umbilical artery  Doppler study. ----------------------------------------------------------------------                   Ma Rings, MD Electronically Signed Final Report   03/21/2021 09:58 am ----------------------------------------------------------------------  [place] MFM UA CORD DOPPLER  Result Date: 04/15/2021 ----------------------------------------------------------------------  OBSTETRICS REPORT                    (Corrected Final 04/15/2021 08:38 am) ---------------------------------------------------------------------- Patient Info  ID #:       [id number]                          D.O.B.:  May 18, 1985 (35 yrs)  Name:       Breanna [name]               Visit Date: 04/11/2021 11:14 am ---------------------------------------------------------------------- Performed By  Attending:        Sander Nephew      Referred By:      Renard Matter MD                    MD  Performed By:     Jeanene Erb BS,      [facility]                    RDMS                                     Fetal Care at                                                             [place] for                                                              Women ---------------------------------------------------------------------- Orders  #  Description                           Code        Ordered By  1  [place] MFM UA CORD DOPPLER                76820.02    RAVI SHANKAR  2  [place] MFM FETAL BPP WO NON               76819.01    RAVI [facility]     STRESS ----------------------------------------------------------------------  #  Order #                     Accession #                Episode #  1  [id number]                   [id number]                 [id number]  2  [id number]                   [id number]                 [id number] ---------------------------------------------------------------------- Indications  Maternal care for known or suspected poor      O36.5930  fetal growth, third trimester, not applicable or  unspecified IUGR  Advanced maternal age primigravida 42+,        O40.513  third trimester (36 yrs)  Hyperemesis gravidarum                         O21.0  Herpes simplex virus (HSV)  O98.519 B00.9  [redacted] weeks gestation of pregnancy                Z3A.33 ---------------------------------------------------------------------- Fetal Evaluation  Num Of Fetuses:         1  Fetal Heart Rate(bpm):  147  Cardiac Activity:       Observed  Presentation:           Cephalic  Placenta:               Anterior  P. Cord Insertion:      Previously Visualized  Amniotic Fluid  AFI FV:      Within normal limits  AFI Sum(cm)     %Tile       Largest Pocket(cm)  11.2            27          4.4  RUQ(cm)       RLQ(cm)       LUQ(cm)        LLQ(cm)  4.4           2.3           2              2.5 ---------------------------------------------------------------------- Biophysical Evaluation  Amniotic F.V:   Pocket => 2 cm             F. Tone:        Observed  F. Movement:    Observed                   Score:          8/8  F. Breathing:   Observed ---------------------------------------------------------------------- OB History  Gravidity:    7         Term:   4         SAB:   1   TOP:          1 ---------------------------------------------------------------------- Gestational Age  LMP:           33w 0d        Date:  08/23/20                 EDD:   05/30/21  Best:          33w 0d     Det. By:  LMP  (08/23/20)          EDD:   05/30/21 ---------------------------------------------------------------------- Anatomy  Diaphragm:             Appears normal         Bladder:                Appears normal  Stomach:               Appears normal, left                         sided ---------------------------------------------------------------------- Doppler - Fetal Vessels  Umbilical Artery   S/D     %tile      RI    %tile      PI    %tile     PSV    ADFV    RDFV                                                     (  cm/s)   2.36       34    0.58       40    0.82       36     56.6      No      No ----------------------------------------------------------------------                    Sander Nephew, MD Electronically Signed Corrected Final Report  04/15/2021 08:38 am ----------------------------------------------------------------------  [place] MFM UA CORD DOPPLER  Result Date: 04/04/2021 ----------------------------------------------------------------------  OBSTETRICS REPORT                       (Signed Final 04/04/2021 11:30 am) ---------------------------------------------------------------------- Patient Info  ID #:       [id number]                          D.O.B.:  May 12, 1985 (35 yrs)  Name:       LORRANE SURBER [facility]               Visit Date: 04/04/2021 10:01 am ---------------------------------------------------------------------- Performed By  Attending:        Johnell Comings MD         Referred By:      Renard Matter MD  Performed By:     Margaretann Loveless     [facility]                    RDMS                                     Fetal Care at                                                             [place] for                                                              Women ---------------------------------------------------------------------- Orders  #  Description                           Code        Ordered By  1  [place] MFM OB FOLLOW UP                   76816.01    RAVI SHANKAR  2  [place] MFM UA CORD DOPPLER                76820.02    RAVI SHANKAR  3  [place] MFM FETAL BPP                      [id number]     RAVI SHANKAR     W/NONSTRESS ----------------------------------------------------------------------  #  Order #  Accession #                Episode #  1  [id number]                   [id number]                 [id number]  2  [id number]                   [id number]                 [id number]:[id number]  3  [id number]                   [id number]                 [id number]:[id number] ---------------------------------------------------------------------- Indications  Maternal care for known or suspected poor      O36.5930  fetal growth, third trimester, not applicable or  unspecified IUGR  Advanced maternal age primigravida 22+,        O5.513  third trimester  Hyperemesis gravidarum                         O21.0  Herpes simplex virus (HSV)                     O98.519 B00.9  [redacted] weeks gestation of pregnancy                Z3A.32 ---------------------------------------------------------------------- Fetal Evaluation  Num Of Fetuses:         1  Fetal Heart Rate(bpm):  152  Cardiac Activity:       Observed  Presentation:           Cephalic  Placenta:               Anterior  P. Cord Insertion:      Previously Visualized  Amniotic Fluid  AFI FV:      Within normal limits  AFI Sum(cm)     %Tile       Largest Pocket(cm)  14.94           52          4.68  RUQ(cm)       RLQ(cm)       LUQ(cm)        LLQ(cm)  4.68          4.52          2.18           3.56 ---------------------------------------------------------------------- Biophysical Evaluation  Amniotic F.V:   Pocket => 2 cm             F. Tone:        Observed  F. Movement:    Observed                   N.S.T:          Reactive  F. Breathing:    Observed                   Score:          10/10 ---------------------------------------------------------------------- Biometry  BPD:      77.1  mm     G. Age:  31w 0d         14  %    CI:        71.86   %    70 -  86                                                          FL/HC:      20.0   %    19.1 - 21.3  HC:      289.5  mm     G. Age:  31w 6d         13  %    HC/AC:      1.11        0.96 - 1.17  AC:      259.9  mm     G. Age:  30w 1d          7  %    FL/BPD:     75.1   %    71 - 87  FL:       57.9  mm     G. Age:  30w 2d          5  %    FL/AC:      22.3   %    20 - 24  HUM:        50  mm     G. Age:  29w 2d        < 5  %  LV:        4.4  mm  Est. FW:    1569  gm      3 lb 7 oz      6  % ---------------------------------------------------------------------- OB History  Gravidity:    7         Term:   4         SAB:   1  TOP:          1 ---------------------------------------------------------------------- Gestational Age  LMP:           32w 0d        Date:  08/23/20                 EDD:   05/30/21  U/S Today:     30w 6d                                        EDD:   06/07/21  Best:          Milderd Meager 0d     Det. By:  LMP  (08/23/20)          EDD:   05/30/21 ---------------------------------------------------------------------- Anatomy  Cranium:               Appears normal         LVOT:                   Appears normal  Cavum:                 Previously seen        Aortic Arch:            Previously seen  Ventricles:            Appears normal         Ductal Arch:  Appears normal  Choroid Plexus:        Previously seen        Diaphragm:              Appears normal  Cerebellum:            Previously seen        Stomach:                Appears normal, left                                                                        sided  Posterior Fossa:       Previously seen        Abdomen:                Appears normal  Nuchal Fold:           Previously seen        Abdominal Wall:         Previously seen  Face:                   Orbits and profile     Cord Vessels:           Previously seen                         previously seen  Lips:                  Previously seen        Kidneys:                Appear normal  Palate:                Previously seen        Bladder:                Appears normal  Thoracic:              Previously seen        Spine:                  Previously seen  Heart:                 Appears normal         Upper Extremities:      Previously seen                         (4CH, axis, and                         situs)  RVOT:                  Appears normal         Lower Extremities:      Previously seen  Other:  Parents do not wish to know sex of fetus. Heels/feet, open hands/5th          digits, nasal bone, lenses, VC, 3VV and 3VTV prev visualized. ---------------------------------------------------------------------- Doppler - Fetal Vessels  Umbilical Artery   S/D     %tile  RI    %tile      PI    %tile            ADFV    RDFV   2.41       33    0.58       35    0.82       32               No      No ---------------------------------------------------------------------- Cervix Uterus Adnexa  Cervix  Not visualized (advanced GA >24wks)  Uterus  Normal shape and size.  Right Ovary  Within normal limits.  Left Ovary  Within normal limits.  Cul De Sac  No free fluid seen.  Adnexa  No adnexal mass visualized. ---------------------------------------------------------------------- Comments  This patient was seen for a follow up growth scan due to fetal  growth restriction. She denies any problems since her last  exam and reports feeling vigorous fetal movements  throughout the day.  On today's exam, the EFW measures at the 6th percentile for  her gestational age indicating fetal growth restriction.  The  fetus has grown over 1 pound over the past 3 weeks.  There  was normal amniotic fluid noted.  A biophysical profile performed today due to fetal growth  restriction was 10 out of 10 with a reactive NST.   Doppler studies of the umbilical arteries showed a normal  S/D ratio of 2.41.  There were no signs of absent or reversed  end-diastolic flow.  Due to fetal growth restriction, we will continue to follow her  with weekly fetal testing and umbilical artery Doppler studies.  Another BPP and umbilical artery Doppler study was  scheduled in 1 week. ----------------------------------------------------------------------                   Johnell Comings, MD Electronically Signed Final Report   04/04/2021 11:30 am ----------------------------------------------------------------------  [place] MFM UA CORD DOPPLER  Result Date: 03/28/2021 ----------------------------------------------------------------------  OBSTETRICS REPORT                       (Signed Final 03/28/2021 10:38 am) ---------------------------------------------------------------------- Patient Info  ID #:       [id number]                          D.O.B.:  1985/08/26 (35 yrs)  Name:       KADEEJAH RAINGE [facility]               Visit Date: 03/28/2021 08:03 am ---------------------------------------------------------------------- Performed By  Attending:        Tama High MD        Referred By:      Renard Matter MD  Performed By:     Eveline Keto         [facility]                    RD[facility]                                     F[facility] at  [facility] for                                                             Women ---------------------------------------------------------------------- Orders  #  Description                           Code        Ordered By  1  [place] MFM OB LIMITED                     [id number]    RAVI SHANKAR  2  [place] MFM UA CORD DOPPLER                [id number]    RAVI [name] ----------------------------------------------------------------------  #  Order #                     Accession #                Episode #  1  [id number]                   [id number]                  [id number]  2  [id number]                   [id number]                 [id number] ---------------------------------------------------------------------- Indications  [redacted] weeks gestation of pregnancy                Z3A.36  Maternal care for known or suspected poor      O36.5930  fetal growth, third trimester, not applicable or  unspecified IUGR  Advanced maternal age primigravida 80+,        O41.513  third trimester  Hyperemesis gravidarum                         O21.0  Herpes simplex virus (HSV)                     O98.519 B00.9 ---------------------------------------------------------------------- Fetal Evaluation  Num Of Fetuses:         1  Fetal Heart Rate(bpm):  147  Cardiac Activity:       Observed  Presentation:           Cephalic  Placenta:               Anterior  Amniotic Fluid  AFI FV:      Within normal limits  AFI Sum(cm)     %Tile       Largest Pocket(cm)  8.86            5           2.72  RUQ(cm)       RLQ(cm)       LUQ(cm)        LLQ(cm)  2.72          1.36          2.39           2.39 ---------------------------------------------------------------------- OB History  Gravidity:    7  Term:   4         SAB:   1  TOP:          1 ---------------------------------------------------------------------- Gestational Age  LMP:           31w 0d        Date:  08/23/20                 EDD:   05/30/21  Best:          Burke Keels 0d     Det. By:  LMP  (08/23/20)          EDD:   05/30/21 ---------------------------------------------------------------------- Anatomy  Cranium:               Previously seen        LVOT:                   Appears normal  Cavum:                 Previously seen        Aortic Arch:            Previously seen  Ventricles:            Previously seen        Ductal Arch:            Appears normal  Choroid Plexus:        Previously seen        Diaphragm:              Appears normal  Cerebellum:            Previously seen        Stomach:                Appears normal, left                                                                         sided  Posterior Fossa:       Previously seen        Abdomen:                Appears normal  Nuchal Fold:           Previously seen        Abdominal Wall:         Previously seen  Face:                  Orbits and profile     Cord Vessels:           Previously seen                         previously seen  Lips:                  Previously seen        Kidneys:                Appear normal  Palate:                Previously seen        Bladder:  Appears normal  Thoracic:              Previously seen        Spine:                  Previously seen  Heart:                 Appears normal         Upper Extremities:      Previously seen                         (4CH, axis, and                         situs)  RVOT:                  Appears normal         Lower Extremities:      Previously seen  Other:  Parents do not wish to know sex of fetus. Heels/feet, open hands/5th          digits, nasal bone, lenses, VC, 3VV and 3VTV prev visualized. ---------------------------------------------------------------------- Doppler - Fetal Vessels  Umbilical Artery   S/D     %tile      RI    %tile      PI    %tile   2.75       50    0.64       55    0.87       36 ---------------------------------------------------------------------- Impression  Severe fetal growth restriction.  On ultrasound performed 2  weeks ago, the estimated fetal weight was at the 3rd  percentile.  Amniotic fluid is normal and good fetal activity seen.  Umbilical artery Doppler showed normal forward diastolic  flow.  NST is reactive (confirmed on Obix and not on strip). ---------------------------------------------------------------------- Recommendations  -NST on 03/31/21  -BPP, NST and UA Doppler from next week.  -Fetal growth assessment next week. ----------------------------------------------------------------------                  Tama High, MD Electronically Signed Final Report   03/28/2021 10:38 am  ----------------------------------------------------------------------  [place] MFM UA CORD DOPPLER  Result Date: 03/21/2021 ----------------------------------------------------------------------  OBSTETRICS REPORT                       (Signed Final 03/21/2021 09:58 am) ---------------------------------------------------------------------- Patient Info  ID #:       [id number]                          D.O.B.:  08/12/1985 (35 yrs)  Name:       ZULEYKA MANNER [facility]               Visit Date: 03/21/2021 07:26 am ---------------------------------------------------------------------- Performed By  Attending:        Johnell Comings MD         Referred By:      Renard Matter MD  Performed By:     Nathen May       [facility]                    RDMS  Fetal Care at                                                             [place] for                                                             Women ---------------------------------------------------------------------- Orders  #  Description                           Code        Ordered By  1  [place] MFM OB LIMITED                     76815.01    RAVI SHANKAR  2  [place] MFM UA CORD DOPPLER                [address].02    [name] ----------------------------------------------------------------------  #  Order #                     Accession #                Episode #  1  [id number]                   [id number]                 [id number]  2  [id number]                   [id number]                 [id number] ---------------------------------------------------------------------- Indications  Maternal care for known or suspected poor      O36.5930  fetal growth, third trimester, not applicable or  unspecified IUGR  Advanced maternal age multigravida 85+,        O78.522  second trimester (36 yo)  Hyperemesis gravidarum                         O21.0  Herpes simplex virus (HSV)                     O98.519 B00.9  [redacted] weeks gestation of  pregnancy                Z3A.30 ---------------------------------------------------------------------- Fetal Evaluation  Num Of Fetuses:         1  Fetal Heart Rate(bpm):  152  Cardiac Activity:       Observed  Presentation:           Breech  Placenta:               Anterior  P. Cord Insertion:      Previously Visualized  Amniotic Fluid  AFI FV:      Within normal limits  AFI Sum(cm)     %Tile       Largest Pocket(cm)  12.56           34  3.58  RUQ(cm)       RLQ(cm)       LUQ(cm)        LLQ(cm)  2.99          3.58          2.83           3.16 ---------------------------------------------------------------------- Biometry  LV:        4.3  mm ---------------------------------------------------------------------- OB History  Gravidity:    7         Term:   4         SAB:   1  TOP:          1 ---------------------------------------------------------------------- Gestational Age  LMP:           30w 0d        Date:  08/23/20                 EDD:   05/30/21  Best:          Weston Settle 0d     Det. By:  LMP  (08/23/20)          EDD:   05/30/21 ---------------------------------------------------------------------- Doppler - Fetal Vessels  Umbilical Artery   S/D     %tile      RI    %tile      PI    %tile     PSV    ADFV    RDFV                                                     (cm/s)   2.63       37    0.62       40    0.82       22    45.25      No      No ---------------------------------------------------------------------- Cervix Uterus Adnexa  Cervix  Length:           2.95  cm.  Normal appearance by transabdominal scan. ---------------------------------------------------------------------- Comments  This patient was seen due to an IUGR fetus.  She denies any  problems since her last exam.  She reports feeling vigorous  fetal movements throughout the day.  The patient had a reactive NST for her gestational age today.  There was normal amniotic fluid noted on today's ultrasound  exam.  Doppler studies of the umbilical  arteries performed due to  fetal growth restriction showed a normal S/D ratio of 2.63.  There were no signs of absent or reversed end-diastolic flow  noted today.  She will return in 1 week for another NST and umbilical artery  Doppler study. ----------------------------------------------------------------------                   Johnell Comings, MD Electronically Signed Final Report   03/21/2021 09:58 am ----------------------------------------------------------------------   Assessment and Plan:  Pregnancy: [id number] at [redacted]w[redacted]d 1. Supervision of high risk pregnancy, antepartum - Routine care  2. Poor fetal growth affecting management of mother in third trimester, single or unspecified fetus - Serial MFM surveillence  3. History of ELISA positive for HSV - Begin ppx 35-36 weeks - valACYclovir (VALTREX) 500 MG tablet; Take 1 tablet (500 mg total) by mouth 2 (two) times daily.  Dispense: 60 tablet; Refill: 6  4. Gastroesophageal reflux disease  without esophagitis - Continue Pepcid PRN - Reviewed dietary and behavioral modifications to reduce occurrence - omeprazole (PRILOSEC OTC) 20 MG tablet; Take 1 tablet (20 mg total) by mouth daily.  Dispense: 30 tablet; Refill: 0  5. Unwanted fertility - BTL consent signed 03/24/2021 with Dr. Rip Harbour  6. [redacted] weeks gestation of pregnancy   Preterm labor symptoms and general obstetric precautions including but not limited to vaginal bleeding, contractions, leaking of fluid and fetal movement were reviewed in detail with the patient. I discussed the assessment and treatment plan with the patient. The patient was provided an opportunity to ask questions and all were answered. The patient agreed with the plan and demonstrated an understanding of the instructions. The patient was advised to call back or seek an in-person office evaluation/go to [facility] at [facility] for any urgent or concerning symptoms. Please refer to After Visit Summary for other  counseling recommendations.   I provided ten minutes of face-to-face time during this encounter.  No follow-ups on file.  Future Appointments  Date Time Provider [place]  04/18/2021 10:30 AM [facility] NURSE [facility]  04/18/2021 10:45 AM [facility] US4 [facility]  04/25/2021  8:15 AM [facility] NURSE [name]  04/25/2021  8:30 AM [facility] US3 [name]  04/25/2021 11:15 AM Griffin Basil, MD [facility] [place], [place] for [employer], [place]

## 2021-04-18 ENCOUNTER — Other Ambulatory Visit: Payer: Self-pay

## 2021-04-18 ENCOUNTER — Ambulatory Visit: Payer: Medicaid Other

## 2021-04-18 ENCOUNTER — Ambulatory Visit: Payer: Medicaid Other | Admitting: *Deleted

## 2021-04-18 ENCOUNTER — Other Ambulatory Visit: Payer: Self-pay | Admitting: *Deleted

## 2021-04-18 ENCOUNTER — Encounter: Payer: Self-pay | Admitting: *Deleted

## 2021-04-18 ENCOUNTER — Ambulatory Visit: Payer: Medicaid Other | Attending: Obstetrics and Gynecology

## 2021-04-18 VITALS — BP 106/64 | HR 93

## 2021-04-18 DIAGNOSIS — O365931 Maternal care for other known or suspected poor fetal growth, third trimester, fetus 1: Secondary | ICD-10-CM

## 2021-04-18 DIAGNOSIS — O210 Mild hyperemesis gravidarum: Secondary | ICD-10-CM | POA: Diagnosis not present

## 2021-04-18 DIAGNOSIS — O98513 Other viral diseases complicating pregnancy, third trimester: Secondary | ICD-10-CM | POA: Diagnosis not present

## 2021-04-18 DIAGNOSIS — O09513 Supervision of elderly primigravida, third trimester: Secondary | ICD-10-CM | POA: Diagnosis not present

## 2021-04-18 DIAGNOSIS — Z3A34 34 weeks gestation of pregnancy: Secondary | ICD-10-CM

## 2021-04-18 DIAGNOSIS — B009 Herpesviral infection, unspecified: Secondary | ICD-10-CM

## 2021-04-18 DIAGNOSIS — O0990 Supervision of high risk pregnancy, unspecified, unspecified trimester: Secondary | ICD-10-CM | POA: Diagnosis present

## 2021-04-18 DIAGNOSIS — O365930 Maternal care for other known or suspected poor fetal growth, third trimester, not applicable or unspecified: Secondary | ICD-10-CM | POA: Diagnosis present

## 2021-04-18 DIAGNOSIS — O09523 Supervision of elderly multigravida, third trimester: Secondary | ICD-10-CM

## 2021-04-22 ENCOUNTER — Other Ambulatory Visit: Payer: Self-pay | Admitting: Obstetrics and Gynecology

## 2021-04-22 DIAGNOSIS — O365930 Maternal care for other known or suspected poor fetal growth, third trimester, not applicable or unspecified: Secondary | ICD-10-CM

## 2021-04-25 ENCOUNTER — Ambulatory Visit: Payer: Medicaid Other | Admitting: *Deleted

## 2021-04-25 ENCOUNTER — Ambulatory Visit: Payer: Medicaid Other | Attending: Obstetrics and Gynecology | Admitting: *Deleted

## 2021-04-25 ENCOUNTER — Ambulatory Visit (HOSPITAL_BASED_OUTPATIENT_CLINIC_OR_DEPARTMENT_OTHER): Payer: Medicaid Other

## 2021-04-25 ENCOUNTER — Other Ambulatory Visit: Payer: Self-pay

## 2021-04-25 ENCOUNTER — Encounter: Payer: Self-pay | Admitting: *Deleted

## 2021-04-25 ENCOUNTER — Telehealth (INDEPENDENT_AMBULATORY_CARE_PROVIDER_SITE_OTHER): Payer: Medicaid Other | Admitting: Obstetrics and Gynecology

## 2021-04-25 ENCOUNTER — Ambulatory Visit: Payer: Medicaid Other

## 2021-04-25 VITALS — BP 114/69 | HR 90

## 2021-04-25 DIAGNOSIS — O09523 Supervision of elderly multigravida, third trimester: Secondary | ICD-10-CM

## 2021-04-25 DIAGNOSIS — Z3A35 35 weeks gestation of pregnancy: Secondary | ICD-10-CM | POA: Insufficient documentation

## 2021-04-25 DIAGNOSIS — O98513 Other viral diseases complicating pregnancy, third trimester: Secondary | ICD-10-CM | POA: Insufficient documentation

## 2021-04-25 DIAGNOSIS — O365930 Maternal care for other known or suspected poor fetal growth, third trimester, not applicable or unspecified: Secondary | ICD-10-CM

## 2021-04-25 DIAGNOSIS — O210 Mild hyperemesis gravidarum: Secondary | ICD-10-CM | POA: Insufficient documentation

## 2021-04-25 DIAGNOSIS — O365931 Maternal care for other known or suspected poor fetal growth, third trimester, fetus 1: Secondary | ICD-10-CM

## 2021-04-25 DIAGNOSIS — O0993 Supervision of high risk pregnancy, unspecified, third trimester: Secondary | ICD-10-CM

## 2021-04-25 DIAGNOSIS — Z8619 Personal history of other infectious and parasitic diseases: Secondary | ICD-10-CM

## 2021-04-25 DIAGNOSIS — O09513 Supervision of elderly primigravida, third trimester: Secondary | ICD-10-CM

## 2021-04-25 DIAGNOSIS — O219 Vomiting of pregnancy, unspecified: Secondary | ICD-10-CM

## 2021-04-25 DIAGNOSIS — O98313 Other infections with a predominantly sexual mode of transmission complicating pregnancy, third trimester: Secondary | ICD-10-CM

## 2021-04-25 DIAGNOSIS — O26893 Other specified pregnancy related conditions, third trimester: Secondary | ICD-10-CM | POA: Diagnosis present

## 2021-04-25 DIAGNOSIS — B009 Herpesviral infection, unspecified: Secondary | ICD-10-CM

## 2021-04-25 DIAGNOSIS — O0990 Supervision of high risk pregnancy, unspecified, unspecified trimester: Secondary | ICD-10-CM

## 2021-04-25 DIAGNOSIS — O09529 Supervision of elderly multigravida, unspecified trimester: Secondary | ICD-10-CM

## 2021-04-25 NOTE — Progress Notes (Signed)
OBSTETRICS PRENATAL VIRTUAL VISIT ENCOUNTER NOTE  Provider location: [name]   Patient location: Home  I connected with Breanna [name] on 04/25/21 at 11:15 AM EST by MyChart Video Encounter and verified that I am speaking with the correct person using two identifiers. I discussed the limitations, risks, security and privacy concerns of performing an evaluation and management service virtually and the availability of in person appointments. I also discussed with the patient that there may be a patient responsible charge related to this service. The patient expressed understanding and agreed to proceed. Subjective:  Breanna [name] is a 36 y.o. [id number] at [redacted]w[redacted]d being seen today for ongoing prenatal care.  She is currently monitored for the following issues for this high-risk pregnancy and has Mental disorders of mother, antepartum; Fibromyalgia; PTSD (post-traumatic stress disorder); Anxiety; Depression; History of COVID-19; Supervision of high risk pregnancy, antepartum; Antepartum multigravida of advanced maternal age; IUGR (intrauterine growth restriction) affecting care of mother; History of ELISA positive for HSV; Unwanted fertility; and Nausea and vomiting during pregnancy on their problem list.  Patient reports  continued severe nausea and vomiting .  Contractions: Not present. Vag. Bleeding: None.  Movement: Present. Denies any leaking of fluid.   The following portions of the patient's history were reviewed and updated as appropriate: allergies, current medications, past family history, past medical history, past social history, past surgical history and problem list.   Objective:  There were no vitals filed for this visit.  Fetal Status:     Movement: Present     General:  Alert, oriented and cooperative. Patient is in no acute distress.  Respiratory: Normal respiratory effort, no problems with respiration noted  Mental Status: Normal  mood and affect. Normal behavior. Normal judgment and thought content.  Rest of physical exam deferred due to type of encounter  Imaging: [place] MFM FETAL BPP W/NONSTRESS  Result Date: 04/25/2021 ----------------------------------------------------------------------  OBSTETRICS REPORT                       (Signed Final 04/25/2021 09:55 am) ---------------------------------------------------------------------- Patient Info  ID #:       [id number]                          D.O.B.:  1986/02/02 (35 yrs)  Name:       Breanna [name]               Visit Date: 04/25/2021 08:06 am ---------------------------------------------------------------------- Performed By  Attending:        Johnell Comings MD         Referred By:      Renard Matter MD  Performed By:     Germain Osgood            Location:         [name]                    RD[facility]                                     [facility] at  [facility]                                                             [facility] ---------------------------------------------------------------------- Orders  #  Description                           Code        Ordered By  1  [place] MFM UA CORD DOPPLER                76820.02    RAVI SHANKAR  2  [place] MFM OB FOLLOW UP                   [id number]    RAVI SHANKAR  3  [place] MFM FETAL BPP                      [id number]     RAVI SHANKAR     W/NONSTRESS ----------------------------------------------------------------------  #  Order #                     Accession #                Episode #  1  [id number]                   [id number]                 [id number]  2  [id number]                   [id number]                 [id number]  3  [id number]                   [id number]                 [id number] ---------------------------------------------------------------------- Indications  Maternal care for known or suspected poor      O36.5930  fetal growth, third trimester, not applicable or  unspecified IUGR   Advanced maternal age primigravida 31+,        O28.513  third trimester (55 yrs)  Hyperemesis gravidarum                         O21.0  Herpes simplex virus (HSV)                     O98.519 B00.9  [redacted] weeks gestation of pregnancy                Z3A.35 ---------------------------------------------------------------------- Fetal Evaluation  Num Of Fetuses:         1  Fetal Heart Rate(bpm):  144  Cardiac Activity:       Observed  Presentation:           Cephalic  Placenta:               Anterior  P. Cord Insertion:      Visualized, central  Amniotic Fluid  AFI FV:      Within normal limits  AFI Sum(cm)     %Tile       Largest Pocket(cm)  13.27  45          4.1  RUQ(cm)       RLQ(cm)       LUQ(cm)        LLQ(cm)  3.61          2.79          2.77           4.1 ---------------------------------------------------------------------- Biophysical Evaluation  Amniotic F.V:   Within normal limits       F. Tone:        Observed  F. Movement:    Observed                   N.S.T:          Reactive  F. Breathing:   Not Observed               Score:          8/10 ---------------------------------------------------------------------- Biometry  BPD:      84.3  mm     G. Age:  34w 0d         23  %    CI:        75.78   %    70 - 86                                                          FL/HC:      21.0   %    20.1 - 22.3  HC:       307   mm     G. Age:  34w 2d          7  %    HC/AC:      1.06        0.93 - 1.11  AC:      289.6  mm     G. Age:  33w 0d          8  %    FL/BPD:     76.4   %    71 - 87  FL:       64.4  mm     G. Age:  33w 2d          8  %    FL/AC:      22.2   %    20 - 24  HUM:      54.7  mm     G. Age:  31w 6d        < 5  %  LV:        2.4  mm  Est. FW:    2156  gm    4 lb 12 oz       9  % ---------------------------------------------------------------------- OB History  Gravidity:    7         Term:   4         SAB:   1  TOP:          1 ----------------------------------------------------------------------  Gestational Age  LMP:           35w 0d        Date:  08/23/20                 EDD:  05/30/21  U/S Today:     33w 5d                                        EDD:   06/08/21  Best:          35w 0d     Det. By:  LMP  (08/23/20)          EDD:   05/30/21 ---------------------------------------------------------------------- Anatomy  Cranium:               Appears normal         LVOT:                   Appears normal  Cavum:                 Previously seen        Aortic Arch:            Previously seen  Ventricles:            Appears normal         Ductal Arch:            Appears normal  Choroid Plexus:        Previously seen        Diaphragm:              Appears normal  Cerebellum:            Previously seen        Stomach:                Appears normal, left                                                                        sided  Posterior Fossa:       Previously seen        Abdomen:                Appears normal  Nuchal Fold:           Previously seen        Abdominal Wall:         Previously seen  Face:                  Orbits and profile     Cord Vessels:           Previously seen                         previously seen  Lips:                  Previously seen        Kidneys:                Appear normal  Palate:                Previously seen        Bladder:                Appears normal  Thoracic:  Previously seen        Spine:                  Previously seen  Heart:                 Appears normal         Upper Extremities:      Previously seen                         (4CH, axis, and                         situs)  RVOT:                  Appears normal         Lower Extremities:      Previously seen  Other:  Parents do not wish to know sex of fetus. Heels/feet, open hands/5th          digits, nasal bone, lenses, VC, 3VV and 3VTV prev visualized. ---------------------------------------------------------------------- Doppler - Fetal Vessels  Umbilical Artery   S/D     %tile      RI    %tile      PI     %tile            ADFV    RDFV   2.19       31    0.54       30    0.77       34               No      No ---------------------------------------------------------------------- Cervix Uterus Adnexa  Cervix  Not visualized (advanced GA >24wks) ---------------------------------------------------------------------- Comments  This patient was seen for a follow up growth scan and BPP  due to fetal growth restriction. She denies any problems since  her last exam and reports feeling vigorous fetal movements  throughout the day.  On today's exam, the EFW measures at the 9th percentile for  her gestational age indicating fetal growth restriction.  The  fetus has grown over 1 pound over the past 3 weeks.  There  was normal amniotic fluid noted.  A biophysical profile performed today due to fetal growth  restriction was 6 out of 8.  She received a -2 for fetal  breathing movements that did not meet criteria.  She  subsequently had a reactive NST making her total  biophysical profile score 8 out of 10.  Doppler studies of the umbilical arteries showed a normal  S/D ratio of 2.19.  There were no signs of absent or reversed  end-diastolic flow.  Another BPP and umbilical artery Doppler study was  scheduled in 1 week.  Due to fetal growth restriction, delivery should be considered  at between 37 to 38 weeks. ----------------------------------------------------------------------                   Johnell Comings, MD Electronically Signed Final Report   04/25/2021 09:55 am ----------------------------------------------------------------------  [place] MFM FETAL BPP W/NONSTRESS  Result Date: 04/04/2021 ----------------------------------------------------------------------  OBSTETRICS REPORT                       (Signed Final 04/04/2021 11:30 am) ---------------------------------------------------------------------- Patient Info  ID #:       [id number]  D.O.B.:  1986/02/11 (35 yrs)  Name:       Breanna [name]                Visit Date: 04/04/2021 10:01 am ---------------------------------------------------------------------- Performed By  Attending:        Johnell Comings MD         Referred By:      Renard Matter MD  Performed By:     Margaretann Loveless     Location:         [name]                    RD[facility]                                     [facility] at                                                             [place] [facility]                                                             [facility] ---------------------------------------------------------------------- Orders  #  Description                           Code        Ordered By  1  [place] MFM OB FOLLOW UP                   76816.01    RAVI SHANKAR  2  [place] MFM UA CORD DOPPLER                76820.02    RAVI SHANKAR  3  [place] MFM FETAL BPP                      [id number]     RAVI [facility]     W/NONSTRESS ----------------------------------------------------------------------  #  Order #                     Accession #                Episode #  1  [id number]                   [id number]                 [id number]  2  [id number]                   [id number]                 [id number]  3  [id number]                   [id number]                 [id number] ---------------------------------------------------------------------- Indications  Maternal care for known or suspected poor      O36.5930  fetal growth, third trimester, not applicable or  unspecified IUGR  Advanced maternal age primigravida 2+,        O80.513  third trimester  Hyperemesis gravidarum                         O21.0  Herpes simplex virus (HSV)                     O98.519 B00.9  [redacted] weeks gestation of pregnancy                Z3A.32 ---------------------------------------------------------------------- Fetal Evaluation  Num Of Fetuses:         1  Fetal Heart Rate(bpm):  152  Cardiac Activity:       Observed  Presentation:           Cephalic  Placenta:               Anterior  P. Cord Insertion:      Previously Visualized   Amniotic Fluid  AFI FV:      Within normal limits  AFI Sum(cm)     %Tile       Largest Pocket(cm)  14.94           52          4.68  RUQ(cm)       RLQ(cm)       LUQ(cm)        LLQ(cm)  4.68          4.52          2.18           3.56 ---------------------------------------------------------------------- Biophysical Evaluation  Amniotic F.V:   Pocket => 2 cm             F. Tone:        Observed  F. Movement:    Observed                   N.S.T:          Reactive  F. Breathing:   Observed                   Score:          10/10 ---------------------------------------------------------------------- Biometry  BPD:      77.1  mm     G. Age:  31w 0d         14  %    CI:        71.86   %    70 - 86                                                          FL/HC:      20.0   %    19.1 - 21.3  HC:      289.5  mm     G. Age:  31w 6d         13  %    HC/AC:      1.11        0.96 - 1.17  AC:      259.9  mm     G. Age:  30w 1d          7  %  FL/BPD:     75.1   %    71 - 87  FL:       57.9  mm     G. Age:  30w 2d          5  %    FL/AC:      22.3   %    20 - 24  HUM:        50  mm     G. Age:  29w 2d        < 5  %  LV:        4.4  mm  Est. FW:    1569  gm      3 lb 7 oz      6  % ---------------------------------------------------------------------- OB History  Gravidity:    7         Term:   4         SAB:   1  TOP:          1 ---------------------------------------------------------------------- Gestational Age  LMP:           32w 0d        Date:  08/23/20                 EDD:   05/30/21  U/S Today:     30w 6d                                        EDD:   06/07/21  Best:          Milderd Meager 0d     Det. By:  LMP  (08/23/20)          EDD:   05/30/21 ---------------------------------------------------------------------- Anatomy  Cranium:               Appears normal         LVOT:                   Appears normal  Cavum:                 Previously seen        Aortic Arch:            Previously seen  Ventricles:            Appears normal          Ductal Arch:            Appears normal  Choroid Plexus:        Previously seen        Diaphragm:              Appears normal  Cerebellum:            Previously seen        Stomach:                Appears normal, left                                                                        sided  Posterior Fossa:  Previously seen        Abdomen:                Appears normal  Nuchal Fold:           Previously seen        Abdominal Wall:         Previously seen  Face:                  Orbits and profile     Cord Vessels:           Previously seen                         previously seen  Lips:                  Previously seen        Kidneys:                Appear normal  Palate:                Previously seen        Bladder:                Appears normal  Thoracic:              Previously seen        Spine:                  Previously seen  Heart:                 Appears normal         Upper Extremities:      Previously seen                         (4CH, axis, and                         situs)  RVOT:                  Appears normal         Lower Extremities:      Previously seen  Other:  Parents do not wish to know sex of fetus. Heels/feet, open hands/5th          digits, nasal bone, lenses, VC, 3VV and 3VTV prev visualized. ---------------------------------------------------------------------- Doppler - Fetal Vessels  Umbilical Artery   S/D     %tile      RI    %tile      PI    %tile            ADFV    RDFV   2.41       33    0.58       35    0.82       32               No      No ---------------------------------------------------------------------- Cervix Uterus Adnexa  Cervix  Not visualized (advanced GA >24wks)  Uterus  Normal shape and size.  Right Ovary  Within normal limits.  Left Ovary  Within normal limits.  Cul De Sac  No free fluid seen.  Adnexa  No adnexal mass visualized. ---------------------------------------------------------------------- Comments  This patient was seen for a follow up growth  scan due to fetal  growth restriction. She denies any problems  since her last  exam and reports feeling vigorous fetal movements  throughout the day.  On today's exam, the EFW measures at the 6th percentile for  her gestational age indicating fetal growth restriction.  The  fetus has grown over 1 pound over the past 3 weeks.  There  was normal amniotic fluid noted.  A biophysical profile performed today due to fetal growth  restriction was 10 out of 10 with a reactive NST.  Doppler studies of the umbilical arteries showed a normal  S/D ratio of 2.41.  There were no signs of absent or reversed  end-diastolic flow.  Due to fetal growth restriction, we will continue to follow her  with weekly fetal testing and umbilical artery Doppler studies.  Another BPP and umbilical artery Doppler study was  scheduled in 1 week. ----------------------------------------------------------------------                   Johnell Comings, MD Electronically Signed Final Report   04/04/2021 11:30 am ----------------------------------------------------------------------  [place] MFM FETAL BPP WO NON STRESS  Result Date: 04/22/2021 ----------------------------------------------------------------------  OBSTETRICS REPORT                    (Corrected Final 04/22/2021 01:23 pm) ---------------------------------------------------------------------- Patient Info  ID #:       [id number]                          D.O.B.:  April 23, 1985 (35 yrs)  Name:       Breanna [name]               Visit Date: 04/18/2021 10:51 am ---------------------------------------------------------------------- Performed By  Attending:        Sander Nephew      Referred By:      Renard Matter MD                    MD  Performed By:     Margaretann Loveless     Location:         [name]                    RD[facility]                                     Fetal Care at                                                             [facility]                                                              [facility] ---------------------------------------------------------------------- Orders  #  Description                           Code        Ordered By  1  [place] MFM UA CORD DOPPLER  [id number]    RAVI SHANKAR  2  [place] MFM FETAL BPP WO NON               76819.01    RAVI [name]     STRESS ----------------------------------------------------------------------  #  Order #                     Accession #                Episode #  1  [id number]                   [id number]                 [id number]  2  [id number]                   [id number]                 [id number] ---------------------------------------------------------------------- Indications  Maternal care for known or suspected poor      O36.5930  fetal growth, third trimester, not applicable or  unspecified IUGR  Advanced maternal age primigravida 91+,        O31.513  third trimester (67 yrs)  Hyperemesis gravidarum                         O21.0  Herpes simplex virus (HSV)                     O98.519 B00.9  [redacted] weeks gestation of pregnancy                Z3A.34 ---------------------------------------------------------------------- Fetal Evaluation  Num Of Fetuses:         1  Fetal Heart Rate(bpm):  131  Cardiac Activity:       Observed  Presentation:           Breech  Placenta:               Anterior  P. Cord Insertion:      Previously Visualized  Amniotic Fluid  AFI FV:      Within normal limits  AFI Sum(cm)     %Tile       Largest Pocket(cm)  10.51           23          4  RUQ(cm)       RLQ(cm)       LUQ(cm)        LLQ(cm)  1.76          2.99          4              1.76 ---------------------------------------------------------------------- Biophysical Evaluation  Amniotic F.V:   Pocket => 2 cm             F. Tone:        Observed  F. Movement:    Observed                   Score:          8/8  F. Breathing:   Observed ---------------------------------------------------------------------- Biometry  LV:          6  mm  ---------------------------------------------------------------------- OB History  Gravidity:    7         Term:   4         SAB:   1  TOP:          1 ---------------------------------------------------------------------- Gestational Age  LMP:           34w 0d        Date:  08/23/20                 EDD:   05/30/21  Best:          34w 0d     Det. By:  LMP  (08/23/20)          EDD:   05/30/21 ---------------------------------------------------------------------- Anatomy  Cranium:               Appears normal         Abdomen:                Bowel loop is                                                                        prominent  Ventricles:            Appears normal         Kidneys:                Appear normal  Diaphragm:             Appears normal         Bladder:                Appears normal  Stomach:               Appears normal, left                         sided  Other:  Technically difficult due to advanced GA and fetal position. ---------------------------------------------------------------------- Doppler - Fetal Vessels  Umbilical Artery   S/D     %tile      RI    %tile      PI    %tile            ADFV    RDFV   2.55       51    0.61       58    0.89       56               No      No ---------------------------------------------------------------------- Cervix Uterus Adnexa  Cervix  Not visualized (advanced GA >24wks)  Uterus  Normal shape and size.  Right Ovary  Within normal limits.  Left Ovary  Within normal limits. ---------------------------------------------------------------------- Impression  Antenatal testing due to IUGR with an EFW 6th% with an AC  < 7th%.  Biophysical profile 8/8 with good fetal movement and  amniotic fluid volume  UA Dopplers are normal with no evidence of AEDF or REDF  The colon appeared prominent with bowel loop measurments  of 9 and 10 mm. There is no polyhydramnios or  hyperperistalsis. ----------------------------------------------------------------------  Recommendations  Continue weekly testing with UA Dopplers  Growth is scheduled in 1 week.  Consider delivery around 38 weeks. ----------------------------------------------------------------------                    Sander Nephew,  MD Electronically Signed Corrected Final Report  04/22/2021 01:23 pm ----------------------------------------------------------------------  [place] MFM FETAL BPP WO NON STRESS  Result Date: 04/15/2021 ----------------------------------------------------------------------  OBSTETRICS REPORT                    (Corrected Final 04/15/2021 08:38 am) ---------------------------------------------------------------------- Patient Info  ID #:       [id number]                          D.O.B.:  [birth year] (35 yrs)  Name:       Breanna [name]               Visit Date: 04/11/2021 11:14 am ---------------------------------------------------------------------- Performed By  Attending:        Sander Nephew      Referred By:      Renard Matter MD                    MD  Performed By:     Jeanene Erb BS,      Location:         [name]                    RDMS                                     Fetal Care at                                                             [place] [facility]                                                             [facility] ---------------------------------------------------------------------- Orders  #  Description                           Code        Ordered By  1  [place] MFM UA CORD DOPPLER                76820.02    RAVI SHANKAR  2  [place] MFM FETAL BPP WO NON               76819.01    RAVI [name]     STRESS ----------------------------------------------------------------------  #  Order #                     Accession #                Episode #  1  [id number]                   [id number]                 [id number]  2  [id number]                   [id number]  [id number] ---------------------------------------------------------------------- Indications   Maternal care for known or suspected poor      O36.5930  fetal growth, third trimester, not applicable or  unspecified IUGR  Advanced maternal age primigravida 63+,        O15.513  third trimester (35 yrs)  Hyperemesis gravidarum                         O21.0  Herpes simplex virus (HSV)                     O98.519 B00.9  [redacted] weeks gestation of pregnancy                Z3A.33 ---------------------------------------------------------------------- Fetal Evaluation  Num Of Fetuses:         1  Fetal Heart Rate(bpm):  147  Cardiac Activity:       Observed  Presentation:           Cephalic  Placenta:               Anterior  P. Cord Insertion:      Previously Visualized  Amniotic Fluid  AFI FV:      Within normal limits  AFI Sum(cm)     %Tile       Largest Pocket(cm)  11.2            27          4.4  RUQ(cm)       RLQ(cm)       LUQ(cm)        LLQ(cm)  4.4           2.3           2              2.5 ---------------------------------------------------------------------- Biophysical Evaluation  Amniotic F.V:   Pocket => 2 cm             F. Tone:        Observed  F. Movement:    Observed                   Score:          8/8  F. Breathing:   Observed ---------------------------------------------------------------------- OB History  Gravidity:    7         Term:   4         SAB:   1  TOP:          1 ---------------------------------------------------------------------- Gestational Age  LMP:           33w 0d        Date:  08/23/20                 EDD:   05/30/21  Best:          33w 0d     Det. By:  LMP  (08/23/20)          EDD:   05/30/21 ---------------------------------------------------------------------- Anatomy  Diaphragm:             Appears normal         Bladder:                Appears normal  Stomach:               Appears normal, left  sided ---------------------------------------------------------------------- Doppler - Fetal Vessels  Umbilical Artery   S/D     %tile      RI    %tile      PI     %tile     PSV    ADFV    RDFV                                                     (cm/s)   2.36       34    0.58       40    0.82       36     56.6      No      No ----------------------------------------------------------------------                    Sander Nephew, MD Electronically Signed Corrected Final Report  04/15/2021 08:38 am ----------------------------------------------------------------------  [place] MFM OB FOLLOW UP  Result Date: 04/25/2021 ----------------------------------------------------------------------  OBSTETRICS REPORT                       (Signed Final 04/25/2021 09:55 am) ---------------------------------------------------------------------- Patient Info  ID #:       [id number]                          D.O.B.:  04-Sep-1985 (35 yrs)  Name:       Breanna [name]               Visit Date: 04/25/2021 08:06 am ---------------------------------------------------------------------- Performed By  Attending:        Johnell Comings MD         Referred By:      Renard Matter MD  Performed By:     Germain Osgood            Location:         [name]                    RD[facility]                                     [facility] at                                                             [place] [facility]                                                             Women ---------------------------------------------------------------------- Orders  #  Description                           Code        Ordered By  1  [place] MFM UA CORD DOPPLER  [id number]    RAVI SHANKAR  2  [place] MFM OB FOLLOW UP                   [id number]    RAVI SHANKAR  3  [place] MFM FETAL BPP                      [id number]     RAVI SHANKAR     W/NONSTRESS ----------------------------------------------------------------------  #  Order #                     Accession #                Episode #  1  [id number]                   [id number]                 [id number]  2  [id number]                   [id number]                 [id number]  3  [id number]                    [id number]                 [id number] ---------------------------------------------------------------------- Indications  Maternal care for known or suspected poor      O36.5930  fetal growth, third trimester, not applicable or  unspecified IUGR  Advanced maternal age primigravida 50+,        O90.513  third trimester (20 yrs)  Hyperemesis gravidarum                         O21.0  Herpes simplex virus (HSV)                     O98.519 B00.9  [redacted] weeks gestation of pregnancy                Z3A.35 ---------------------------------------------------------------------- Fetal Evaluation  Num Of Fetuses:         1  Fetal Heart Rate(bpm):  144  Cardiac Activity:       Observed  Presentation:           Cephalic  Placenta:               Anterior  P. Cord Insertion:      Visualized, central  Amniotic Fluid  AFI FV:      Within normal limits  AFI Sum(cm)     %Tile       Largest Pocket(cm)  13.27           45          4.1  RUQ(cm)       RLQ(cm)       LUQ(cm)        LLQ(cm)  3.61          2.79          2.77           4.1 ---------------------------------------------------------------------- Biophysical Evaluation  Amniotic F.V:   Within normal limits       F. Tone:        Observed  F. Movement:    Observed  N.S.T:          Reactive  F. Breathing:   Not Observed               Score:          8/10 ---------------------------------------------------------------------- Biometry  BPD:      84.3  mm     G. Age:  34w 0d         23  %    CI:        75.78   %    70 - 86                                                          FL/HC:      21.0   %    20.1 - 22.3  HC:       307   mm     G. Age:  34w 2d          7  %    HC/AC:      1.06        0.93 - 1.11  AC:      289.6  mm     G. Age:  33w 0d          8  %    FL/BPD:     76.4   %    71 - 87  FL:       64.4  mm     G. Age:  33w 2d          8  %    FL/AC:      22.2   %    20 - 24  HUM:      54.7  mm     G. Age:  31w 6d        < 5  %  LV:        2.4  mm  Est.  FW:    2156  gm    4 lb 12 oz       9  % ---------------------------------------------------------------------- OB History  Gravidity:    7         Term:   4         SAB:   1  TOP:          1 ---------------------------------------------------------------------- Gestational Age  LMP:           35w 0d        Date:  08/23/20                 EDD:   05/30/21  U/S Today:     33w 5d                                        EDD:   06/08/21  Best:          35w 0d     Det. By:  LMP  (08/23/20)          EDD:   05/30/21 ---------------------------------------------------------------------- Anatomy  Cranium:               Appears normal         LVOT:  Appears normal  Cavum:                 Previously seen        Aortic Arch:            Previously seen  Ventricles:            Appears normal         Ductal Arch:            Appears normal  Choroid Plexus:        Previously seen        Diaphragm:              Appears normal  Cerebellum:            Previously seen        Stomach:                Appears normal, left                                                                        sided  Posterior Fossa:       Previously seen        Abdomen:                Appears normal  Nuchal Fold:           Previously seen        Abdominal Wall:         Previously seen  Face:                  Orbits and profile     Cord Vessels:           Previously seen                         previously seen  Lips:                  Previously seen        Kidneys:                Appear normal  Palate:                Previously seen        Bladder:                Appears normal  Thoracic:              Previously seen        Spine:                  Previously seen  Heart:                 Appears normal         Upper Extremities:      Previously seen                         (4CH, axis, and                         situs)  RVOT:  Appears normal         Lower Extremities:      Previously seen  Other:  Parents do not wish to know sex  of fetus. Heels/feet, open hands/5th          digits, nasal bone, lenses, VC, 3VV and 3VTV prev visualized. ---------------------------------------------------------------------- Doppler - Fetal Vessels  Umbilical Artery   S/D     %tile      RI    %tile      PI    %tile            ADFV    RDFV   2.19       31    0.54       30    0.77       34               No      No ---------------------------------------------------------------------- Cervix Uterus Adnexa  Cervix  Not visualized (advanced GA >24wks) ---------------------------------------------------------------------- Comments  This patient was seen for a follow up growth scan and BPP  due to fetal growth restriction. She denies any problems since  her last exam and reports feeling vigorous fetal movements  throughout the day.  On today's exam, the EFW measures at the 9th percentile for  her gestational age indicating fetal growth restriction.  The  fetus has grown over 1 pound over the past 3 weeks.  There  was normal amniotic fluid noted.  A biophysical profile performed today due to fetal growth  restriction was 6 out of 8.  She received a -2 for fetal  breathing movements that did not meet criteria.  She  subsequently had a reactive NST making her total  biophysical profile score 8 out of 10.  Doppler studies of the umbilical arteries showed a normal  S/D ratio of 2.19.  There were no signs of absent or reversed  end-diastolic flow.  Another BPP and umbilical artery Doppler study was  scheduled in 1 week.  Due to fetal growth restriction, delivery should be considered  at between 37 to 38 weeks. ----------------------------------------------------------------------                   Johnell Comings, MD Electronically Signed Final Report   04/25/2021 09:55 am ----------------------------------------------------------------------  [place] MFM OB FOLLOW UP  Result Date: 04/04/2021 ----------------------------------------------------------------------  OBSTETRICS  REPORT                       (Signed Final 04/04/2021 11:30 am) ---------------------------------------------------------------------- Patient Info  ID #:       [id number]                          D.O.B.:  Aug 09, 1985 (35 yrs)  Name:       Breanna [name]               Visit Date: 04/04/2021 10:01 am ---------------------------------------------------------------------- Performed By  Attending:        Johnell Comings MD         Referred By:      Renard Matter MD  Performed By:     Margaretann Loveless     Location:         [name]                    RD[facility]  [facility] at                                                             [place] for                                                             Women ---------------------------------------------------------------------- Orders  #  Description                           Code        Ordered By  1  [place] MFM OB FOLLOW UP                   76816.01    RAVI SHANKAR  2  [place] MFM UA CORD DOPPLER                76820.02    RAVI SHANKAR  3  [place] MFM FETAL BPP                      76818.5     RAVI [name]     W/NONSTRESS ----------------------------------------------------------------------  #  Order #                     Accession #                Episode #  1  [id number]                   [id number]                 [id number]  2  [id number]                   [id number]                 [id number]  3  [id number]                   [id number]                 [id number] ---------------------------------------------------------------------- Indications  Maternal care for known or suspected poor      O36.5930  fetal growth, third trimester, not applicable or  unspecified IUGR  Advanced maternal age primigravida 45+,        O78.513  third trimester  Hyperemesis gravidarum                         O21.0  Herpes simplex virus (HSV)                     O98.519 B00.9  [redacted] weeks gestation of pregnancy                Z3A.32  ---------------------------------------------------------------------- Fetal Evaluation  Num Of Fetuses:         1  Fetal Heart Rate(bpm):  152  Cardiac Activity:       Observed  Presentation:  Cephalic  Placenta:               Anterior  P. Cord Insertion:      Previously Visualized  Amniotic Fluid  AFI FV:      Within normal limits  AFI Sum(cm)     %Tile       Largest Pocket(cm)  14.94           52          4.68  RUQ(cm)       RLQ(cm)       LUQ(cm)        LLQ(cm)  4.68          4.52          2.18           3.56 ---------------------------------------------------------------------- Biophysical Evaluation  Amniotic F.V:   Pocket => 2 cm             F. Tone:        Observed  F. Movement:    Observed                   N.S.T:          Reactive  F. Breathing:   Observed                   Score:          10/10 ---------------------------------------------------------------------- Biometry  BPD:      77.1  mm     G. Age:  31w 0d         14  %    CI:        71.86   %    70 - 86                                                          FL/HC:      20.0   %    19.1 - 21.3  HC:      289.5  mm     G. Age:  31w 6d         13  %    HC/AC:      1.11        0.96 - 1.17  AC:      259.9  mm     G. Age:  30w 1d          7  %    FL/BPD:     75.1   %    71 - 87  FL:       57.9  mm     G. Age:  30w 2d          5  %    FL/AC:      22.3   %    20 - 24  HUM:        50  mm     G. Age:  29w 2d        < 5  %  LV:        4.4  mm  Est. FW:    1569  gm      3 lb 7 oz      6  % ---------------------------------------------------------------------- OB History  Gravidity:    7  Term:   4         SAB:   1  TOP:          1 ---------------------------------------------------------------------- Gestational Age  LMP:           32w 0d        Date:  08/23/20                 EDD:   05/30/21  U/S Today:     30w 6d                                        EDD:   06/07/21  Best:          Armida Sans 0d     Det. By:  LMP  (08/23/20)          EDD:    05/30/21 ---------------------------------------------------------------------- Anatomy  Cranium:               Appears normal         LVOT:                   Appears normal  Cavum:                 Previously seen        Aortic Arch:            Previously seen  Ventricles:            Appears normal         Ductal Arch:            Appears normal  Choroid Plexus:        Previously seen        Diaphragm:              Appears normal  Cerebellum:            Previously seen        Stomach:                Appears normal, left                                                                        sided  Posterior Fossa:       Previously seen        Abdomen:                Appears normal  Nuchal Fold:           Previously seen        Abdominal Wall:         Previously seen  Face:                  Orbits and profile     Cord Vessels:           Previously seen                         previously seen  Lips:                  Previously seen  Kidneys:                Appear normal  Palate:                Previously seen        Bladder:                Appears normal  Thoracic:              Previously seen        Spine:                  Previously seen  Heart:                 Appears normal         Upper Extremities:      Previously seen                         (4CH, axis, and                         situs)  RVOT:                  Appears normal         Lower Extremities:      Previously seen  Other:  Parents do not wish to know sex of fetus. Heels/feet, open hands/5th          digits, nasal bone, lenses, VC, 3VV and 3VTV prev visualized. ---------------------------------------------------------------------- Doppler - Fetal Vessels  Umbilical Artery   S/D     %tile      RI    %tile      PI    %tile            ADFV    RDFV   2.41       33    0.58       35    0.82       32               No      No ---------------------------------------------------------------------- Cervix Uterus Adnexa  Cervix  Not visualized (advanced GA  >24wks)  Uterus  Normal shape and size.  Right Ovary  Within normal limits.  Left Ovary  Within normal limits.  Cul De Sac  No free fluid seen.  Adnexa  No adnexal mass visualized. ---------------------------------------------------------------------- Comments  This patient was seen for a follow up growth scan due to fetal  growth restriction. She denies any problems since her last  exam and reports feeling vigorous fetal movements  throughout the day.  On today's exam, the EFW measures at the 6th percentile for  her gestational age indicating fetal growth restriction.  The  fetus has grown over 1 pound over the past 3 weeks.  There  was normal amniotic fluid noted.  A biophysical profile performed today due to fetal growth  restriction was 10 out of 10 with a reactive NST.  Doppler studies of the umbilical arteries showed a normal  S/D ratio of 2.41.  There were no signs of absent or reversed  end-diastolic flow.  Due to fetal growth restriction, we will continue to follow her  with weekly fetal testing and umbilical artery Doppler studies.  Another BPP and umbilical artery Doppler study was  scheduled in 1 week. ----------------------------------------------------------------------  Johnell Comings, MD Electronically Signed Final Report   04/04/2021 11:30 am ----------------------------------------------------------------------  [place] MFM OB LIMITED  Result Date: 03/28/2021 ----------------------------------------------------------------------  OBSTETRICS REPORT                       (Signed Final 03/28/2021 10:38 am) ---------------------------------------------------------------------- Patient Info  ID #:       [id number]                          D.O.B.:  1985/12/08 (35 yrs)  Name:       Breanna [name] [name]               Visit Date: 03/28/2021 08:03 am ---------------------------------------------------------------------- Performed By  Attending:        Tama High MD        Referred By:      Renard Matter  MD  Performed By:     Eveline Keto         Location:         [name]                    RDMS                                     Fetal Care at                                                             [place] for                                                             Women ---------------------------------------------------------------------- Orders  #  Description                           Code        Ordered By  1  [place] MFM OB LIMITED                     76815.01    RAVI SHANKAR  2  [place] MFM UA CORD DOPPLER                [id number]    RAVI [name] ----------------------------------------------------------------------  #  Order #                     Accession #                Episode #  1  [id number]                   [id number]                 [id number]  2  [id number]                   [id number]                 [id number] ----------------------------------------------------------------------  Indications  [redacted] weeks gestation of pregnancy                Z3A.34  Maternal care for known or suspected poor      O36.5930  fetal growth, third trimester, not applicable or  unspecified IUGR  Advanced maternal age primigravida 60+,        O73.513  third trimester  Hyperemesis gravidarum                         O21.0  Herpes simplex virus (HSV)                     O98.519 B00.9 ---------------------------------------------------------------------- Fetal Evaluation  Num Of Fetuses:         1  Fetal Heart Rate(bpm):  147  Cardiac Activity:       Observed  Presentation:           Cephalic  Placenta:               Anterior  Amniotic Fluid  AFI FV:      Within normal limits  AFI Sum(cm)     %Tile       Largest Pocket(cm)  8.86            5           2.72  RUQ(cm)       RLQ(cm)       LUQ(cm)        LLQ(cm)  2.72          1.36          2.39           2.39 ---------------------------------------------------------------------- OB History  Gravidity:    7         Term:   4         SAB:   1  TOP:          1  ---------------------------------------------------------------------- Gestational Age  LMP:           31w 0d        Date:  08/23/20                 EDD:   05/30/21  Best:          31w 0d     Det. By:  LMP  (08/23/20)          EDD:   05/30/21 ---------------------------------------------------------------------- Anatomy  Cranium:               Previously seen        LVOT:                   Appears normal  Cavum:                 Previously seen        Aortic Arch:            Previously seen  Ventricles:            Previously seen        Ductal Arch:            Appears normal  Choroid Plexus:        Previously seen        Diaphragm:              Appears normal  Cerebellum:            Previously seen  Stomach:                Appears normal, left                                                                        sided  Posterior Fossa:       Previously seen        Abdomen:                Appears normal  Nuchal Fold:           Previously seen        Abdominal Wall:         Previously seen  Face:                  Orbits and profile     Cord Vessels:           Previously seen                         previously seen  Lips:                  Previously seen        Kidneys:                Appear normal  Palate:                Previously seen        Bladder:                Appears normal  Thoracic:              Previously seen        Spine:                  Previously seen  Heart:                 Appears normal         Upper Extremities:      Previously seen                         (4CH, axis, and                         situs)  RVOT:                  Appears normal         Lower Extremities:      Previously seen  Other:  Parents do not wish to know sex of fetus. Heels/feet, open hands/5th          digits, nasal bone, lenses, VC, 3VV and 3VTV prev visualized. ---------------------------------------------------------------------- Doppler - Fetal Vessels  Umbilical Artery   S/D     %tile      RI    %tile      PI     %tile   2.75       50    0.64       55    0.87       36 ---------------------------------------------------------------------- Impression  Severe fetal growth restriction.  On  ultrasound performed 2  weeks ago, the estimated fetal weight was at the 3rd  percentile.  Amniotic fluid is normal and good fetal activity seen.  Umbilical artery Doppler showed normal forward diastolic  flow.  NST is reactive (confirmed on Obix and not on strip). ---------------------------------------------------------------------- Recommendations  -NST on 03/31/21  -BPP, NST and UA Doppler from next week.  -Fetal growth assessment next week. ----------------------------------------------------------------------                  Tama High, MD Electronically Signed Final Report   03/28/2021 10:38 am ----------------------------------------------------------------------  [place] MFM UA CORD DOPPLER  Result Date: 04/25/2021 ----------------------------------------------------------------------  OBSTETRICS REPORT                       (Signed Final 04/25/2021 09:55 am) ---------------------------------------------------------------------- Patient Info  ID #:       [id number]                          D.O.B.:  [birth year] (35 yrs)  Name:       AMAZIN [facility]WELL [name]               Visit Date: 04/25/2021 08:06 am ---------------------------------------------------------------------- Performed By  Attending:        Johnell Comings MD         Referred By:      Renard Matter MD  Performed By:     Germain Osgood            Location:         [name]                    RDMS                                     Fetal Care at                                                             [place] for                                                             Women ---------------------------------------------------------------------- Orders  #  Description                           Code        Ordered By  1  [place] MFM UA CORD DOPPLER                76820.02    [place]  2  [place] MFM OB FOLLOW UP                   76816.01    RAVI SHANKAR  3  [place] MFM FETAL BPP                      [id number]     [place]  W/NONSTRESS ----------------------------------------------------------------------  #  Order #                     Accession #                Episode #  1  [id number]                   [id number]                 [id number]  2  [id number]                   [id number]                 [id number]  3  [id number]                   [id number]                 [id number] ---------------------------------------------------------------------- Indications  Maternal care for known or suspected poor      O36.5930  fetal growth, third trimester, not applicable or  unspecified IUGR  Advanced maternal age primigravida 55+,        O52.513  third trimester (35 yrs)  Hyperemesis gravidarum                         O21.0  Herpes simplex virus (HSV)                     O98.519 B00.9  [redacted] weeks gestation of pregnancy                Z3A.35 ---------------------------------------------------------------------- Fetal Evaluation  Num Of Fetuses:         1  Fetal Heart Rate(bpm):  144  Cardiac Activity:       Observed  Presentation:           Cephalic  Placenta:               Anterior  P. Cord Insertion:      Visualized, central  Amniotic Fluid  AFI FV:      Within normal limits  AFI Sum(cm)     %Tile       Largest Pocket(cm)  13.27           45          4.1  RUQ(cm)       RLQ(cm)       LUQ(cm)        LLQ(cm)  3.61          2.79          2.77           4.1 ---------------------------------------------------------------------- Biophysical Evaluation  Amniotic F.V:   Within normal limits       F. Tone:        Observed  F. Movement:    Observed                   N.S.T:          Reactive  F. Breathing:   Not Observed               Score:          8/10 ---------------------------------------------------------------------- Biometry  BPD:      84.3  mm     G. Age:  34w 0d         23  %  CI:        75.78   %    70  - 86                                                          FL/HC:      21.0   %    20.1 - 22.3  HC:       307   mm     G. Age:  34w 2d          7  %    HC/AC:      1.06        0.93 - 1.11  AC:      289.6  mm     G. Age:  33w 0d          8  %    FL/BPD:     76.4   %    71 - 87  FL:       64.4  mm     G. Age:  33w 2d          8  %    FL/AC:      22.2   %    20 - 24  HUM:      54.7  mm     G. Age:  31w 6d        < 5  %  LV:        2.4  mm  Est. FW:    2156  gm    4 lb 12 oz       9  % ---------------------------------------------------------------------- OB History  Gravidity:    7         Term:   4         SAB:   1  TOP:          1 ---------------------------------------------------------------------- Gestational Age  LMP:           35w 0d        Date:  08/23/20                 EDD:   05/30/21  U/S Today:     33w 5d                                        EDD:   06/08/21  Best:          35w 0d     Det. By:  LMP  (08/23/20)          EDD:   05/30/21 ---------------------------------------------------------------------- Anatomy  Cranium:               Appears normal         LVOT:                   Appears normal  Cavum:                 Previously seen        Aortic Arch:            Previously seen  Ventricles:            Appears normal  Ductal Arch:            Appears normal  Choroid Plexus:        Previously seen        Diaphragm:              Appears normal  Cerebellum:            Previously seen        Stomach:                Appears normal, left                                                                        sided  Posterior Fossa:       Previously seen        Abdomen:                Appears normal  Nuchal Fold:           Previously seen        Abdominal Wall:         Previously seen  Face:                  Orbits and profile     Cord Vessels:           Previously seen                         previously seen  Lips:                  Previously seen        Kidneys:                Appear normal  Palate:                 Previously seen        Bladder:                Appears normal  Thoracic:              Previously seen        Spine:                  Previously seen  Heart:                 Appears normal         Upper Extremities:      Previously seen                         (4CH, axis, and                         situs)  RVOT:                  Appears normal         Lower Extremities:      Previously seen  Other:  Parents do not wish to know sex of fetus. Heels/feet, open hands/5th          digits, nasal bone, lenses, VC, 3VV and 3VTV prev visualized. ---------------------------------------------------------------------- Doppler - Fetal Vessels  Umbilical Artery   S/D     %tile      RI    %tile      PI    %tile            ADFV    RDFV   2.19       31    0.54       30    0.77       34               No      No ---------------------------------------------------------------------- Cervix Uterus Adnexa  Cervix  Not visualized (advanced GA >24wks) ---------------------------------------------------------------------- Comments  This patient was seen for a follow up growth scan and BPP  due to fetal growth restriction. She denies any problems since  her last exam and reports feeling vigorous fetal movements  throughout the day.  On today's exam, the EFW measures at the 9th percentile for  her gestational age indicating fetal growth restriction.  The  fetus has grown over 1 pound over the past 3 weeks.  There  was normal amniotic fluid noted.  A biophysical profile performed today due to fetal growth  restriction was 6 out of 8.  She received a -2 for fetal  breathing movements that did not meet criteria.  She  subsequently had a reactive NST making her total  biophysical profile score 8 out of 10.  Doppler studies of the umbilical arteries showed a normal  S/D ratio of 2.19.  There were no signs of absent or reversed  end-diastolic flow.  Another BPP and umbilical artery Doppler study was  scheduled in 1 week.  Due to fetal  growth restriction, delivery should be considered  at between 37 to 38 weeks. ----------------------------------------------------------------------                   Johnell Comings, MD Electronically Signed Final Report   04/25/2021 09:55 am ----------------------------------------------------------------------  [place] MFM UA CORD DOPPLER  Result Date: 04/22/2021 ----------------------------------------------------------------------  OBSTETRICS REPORT                    (Corrected Final 04/22/2021 01:23 pm) ---------------------------------------------------------------------- Patient Info  ID #:       [id number]                          D.O.B.:  1985/09/25 (35 yrs)  Name:       LYLITH KERTIS [name]               Visit Date: 04/18/2021 10:51 am ---------------------------------------------------------------------- Performed By  Attending:        Sander Nephew      Referred By:      Renard Matter MD                    MD  Performed By:     Margaretann Loveless     Location:         [name]                    RDMS                                     [facility]                                                             [facility] ---------------------------------------------------------------------- Orders  #  Description                           Code        Ordered By  1  [place] MFM UA CORD DOPPLER                [id number]    RAVI SHANKAR  2  [place] MFM FETAL BPP WO NON               [id number]    RAVI [name]     STRESS ----------------------------------------------------------------------  #  Order #                     Accession #                Episode #  1  [id number]                   [id number]                 [id number]  2  [id number]                   [id number]                 [id number] ---------------------------------------------------------------------- Indications  Maternal care for known or suspected poor      O36.5930  fetal growth, third  trimester, not applicable or  unspecified IUGR  Advanced maternal age primigravida 64+,        O90.513  third trimester (57 yrs)  Hyperemesis gravidarum                         O21.0  Herpes simplex virus (HSV)                     O98.519 B00.9  [redacted] weeks gestation of pregnancy                Z3A.34 ---------------------------------------------------------------------- Fetal Evaluation  Num Of Fetuses:         1  Fetal Heart Rate(bpm):  131  Cardiac Activity:       Observed  Presentation:           Breech  Placenta:               Anterior  P. Cord Insertion:      Previously Visualized  Amniotic Fluid  AFI FV:      Within normal limits  AFI Sum(cm)     %Tile       Largest Pocket(cm)  10.51           23          4  RUQ(cm)       RLQ(cm)       LUQ(cm)        LLQ(cm)  1.76          2.99          4  1.76 ---------------------------------------------------------------------- Biophysical Evaluation  Amniotic F.V:   Pocket => 2 cm             F. Tone:        Observed  F. Movement:    Observed                   Score:          8/8  F. Breathing:   Observed ---------------------------------------------------------------------- Biometry  LV:          6  mm ---------------------------------------------------------------------- OB History  Gravidity:    7         Term:   4         SAB:   1  TOP:          1 ---------------------------------------------------------------------- Gestational Age  LMP:           34w 0d        Date:  08/23/20                 EDD:   05/30/21  Best:          34w 0d     Det. By:  LMP  (08/23/20)          EDD:   05/30/21 ---------------------------------------------------------------------- Anatomy  Cranium:               Appears normal         Abdomen:                Bowel loop is                                                                        prominent  Ventricles:            Appears normal         Kidneys:                Appear normal  Diaphragm:             Appears normal          Bladder:                Appears normal  Stomach:               Appears normal, left                         sided  Other:  Technically difficult due to advanced GA and fetal position. ---------------------------------------------------------------------- Doppler - Fetal Vessels  Umbilical Artery   S/D     %tile      RI    %tile      PI    %tile            ADFV    RDFV   2.55       51    0.61       58    0.89       56               No      No ---------------------------------------------------------------------- Cervix Uterus Adnexa  Cervix  Not visualized (advanced GA >24wks)  Uterus  Normal shape and size.  Right Ovary  Within normal limits.  Left Ovary  Within normal limits. ---------------------------------------------------------------------- Impression  Antenatal testing due to IUGR with an EFW 6th% with an AC  < 7th%.  Biophysical profile 8/8 with good fetal movement and  amniotic fluid volume  UA Dopplers are normal with no evidence of AEDF or REDF  The colon appeared prominent with bowel loop measurments  of 9 and 10 mm. There is no polyhydramnios or  hyperperistalsis. ---------------------------------------------------------------------- Recommendations  Continue weekly testing with UA Dopplers  Growth is scheduled in 1 week.  Consider delivery around 38 weeks. ----------------------------------------------------------------------                    Sander Nephew, MD Electronically Signed Corrected Final Report  04/22/2021 01:23 pm ----------------------------------------------------------------------  [place] MFM UA CORD DOPPLER  Result Date: 04/15/2021 ----------------------------------------------------------------------  OBSTETRICS REPORT                    (Corrected Final 04/15/2021 08:38 am) ---------------------------------------------------------------------- Patient Info  ID #:       [id number]                          D.O.B.:  12-19-1985 (35 yrs)  Name:       TAVARIA SPERBECK [name]               Visit  Date: 04/11/2021 11:14 am ---------------------------------------------------------------------- Performed By  Attending:        Sander Nephew      Referred By:      Renard Matter MD                    MD  Performed By:     Jeanene Erb BS,      Location:         [name]                    RDMS                                     Fetal Care at                                                             [place] [facility]                                                             [facility] ---------------------------------------------------------------------- Orders  #  Description                           Code        Ordered By  1  [place] MFM UA CORD DOPPLER                76820.02    RAVI SHANKAR  2  [place] MFM FETAL BPP WO NON  [id number]    [place] ----------------------------------------------------------------------  #  Order #                     Accession #                Episode #  1  [id number]                   [id number]                 [id number]  2  [id number]                   [id number]                 [id number] ---------------------------------------------------------------------- Indications  Maternal care for known or suspected poor      O36.5930  fetal growth, third trimester, not applicable or  unspecified IUGR  Advanced maternal age primigravida 57+,        O4.513  third trimester (1 yrs)  Hyperemesis gravidarum                         O21.0  Herpes simplex virus (HSV)                     O98.519 B00.9  [redacted] weeks gestation of pregnancy                Z3A.33 ---------------------------------------------------------------------- Fetal Evaluation  Num Of Fetuses:         1  Fetal Heart Rate(bpm):  147  Cardiac Activity:       Observed  Presentation:           Cephalic  Placenta:               Anterior  P. Cord Insertion:      Previously Visualized  Amniotic Fluid  AFI FV:      Within normal limits  AFI Sum(cm)     %Tile       Largest Pocket(cm)  11.2            27           4.4  RUQ(cm)       RLQ(cm)       LUQ(cm)        LLQ(cm)  4.4           2.3           2              2.5 ---------------------------------------------------------------------- Biophysical Evaluation  Amniotic F.V:   Pocket => 2 cm             F. Tone:        Observed  F. Movement:    Observed                   Score:          8/8  F. Breathing:   Observed ---------------------------------------------------------------------- OB History  Gravidity:    7         Term:   4         SAB:   1  TOP:          1 ---------------------------------------------------------------------- Gestational Age  LMP:           33w 0d        Date:  08/23/20  EDD:   05/30/21  Best:          33w 0d     Det. By:  LMP  (08/23/20)          EDD:   05/30/21 ---------------------------------------------------------------------- Anatomy  Diaphragm:             Appears normal         Bladder:                Appears normal  Stomach:               Appears normal, left                         sided ---------------------------------------------------------------------- Doppler - Fetal Vessels  Umbilical Artery   S/D     %tile      RI    %tile      PI    %tile     PSV    ADFV    RDFV                                                     (cm/s)   2.36       34    0.58       40    0.82       36     56.6      No      No ----------------------------------------------------------------------                    Sander Nephew, MD Electronically Signed Corrected Final Report  04/15/2021 08:38 am ----------------------------------------------------------------------  [place] MFM UA CORD DOPPLER  Result Date: 04/04/2021 ----------------------------------------------------------------------  OBSTETRICS REPORT                       (Signed Final 04/04/2021 11:30 am) ---------------------------------------------------------------------- Patient Info  ID #:       [id number]                          D.O.B.:  1985/11/02 (35 yrs)  Name:       ANGELYNA MCCLEERY  [name]               Visit Date: 04/04/2021 10:01 am ---------------------------------------------------------------------- Performed By  Attending:        Johnell Comings MD         Referred By:      Renard Matter MD  Performed By:     Margaretann Loveless     Location:         [name]                    RDMS                                     [facility] ---------------------------------------------------------------------- Orders  #  Description                           Code        Ordered By  1  [place] MFM OB FOLLOW UP                   [id number]    RAVI SHANKAR  2  [place] MFM UA CORD DOPPLER                [id number]    RAVI SHANKAR  3  [place] MFM FETAL BPP                      29924.2     RAVI [name]     W/NONSTRESS ----------------------------------------------------------------------  #  Order #                     Accession #                Episode #  1  [id number]                   [id number]  2  [id number]                   [id number]                 [id number]  3  [id number]                   [id number]                 [id number] ---------------------------------------------------------------------- Indications  Maternal care for known or suspected poor      O36.5930  fetal growth, third trimester, not applicable or  unspecified IUGR  Advanced maternal age primigravida 50+,        O20.513  third trimester  Hyperemesis gravidarum                         O21.0  Herpes simplex virus (HSV)                     O98.519 B00.9  [redacted] weeks gestation of pregnancy                Z3A.32 ---------------------------------------------------------------------- Fetal Evaluation  Num Of Fetuses:         1  Fetal Heart Rate(bpm):  152  Cardiac Activity:       Observed  Presentation:           Cephalic  Placenta:               Anterior  P. Cord Insertion:      Previously  Visualized  Amniotic Fluid  AFI FV:      Within normal limits  AFI Sum(cm)     %Tile       Largest Pocket(cm)  14.94  52          4.68  RUQ(cm)       RLQ(cm)       LUQ(cm)        LLQ(cm)  4.68          4.52          2.18           3.56 ---------------------------------------------------------------------- Biophysical Evaluation  Amniotic F.V:   Pocket => 2 cm             F. Tone:        Observed  F. Movement:    Observed                   N.S.T:          Reactive  F. Breathing:   Observed                   Score:          10/10 ---------------------------------------------------------------------- Biometry  BPD:      77.1  mm     G. Age:  31w 0d         14  %    CI:        71.86   %    70 - 86                                                          FL/HC:      20.0   %    19.1 - 21.3  HC:      289.5  mm     G. Age:  31w 6d         13  %    HC/AC:      1.11        0.96 - 1.17  AC:      259.9  mm     G. Age:  30w 1d          7  %    FL/BPD:     75.1   %    71 - 87  FL:       57.9  mm     G. Age:  30w 2d          5  %    FL/AC:      22.3   %    20 - 24  HUM:        50  mm     G. Age:  29w 2d        < 5  %  LV:        4.4  mm  Est. FW:    1569  gm      3 lb 7 oz      6  % ---------------------------------------------------------------------- OB History  Gravidity:    7         Term:   4         SAB:   1  TOP:          1 ---------------------------------------------------------------------- Gestational Age  LMP:           32w 0d        Date:  08/23/20  EDD:   05/30/21  U/S Today:     30w 6d                                        EDD:   06/07/21  Best:          Armida Sans32w 0d     Det. By:  LMP  (08/23/20)          EDD:   05/30/21 ---------------------------------------------------------------------- Anatomy  Cranium:               Appears normal         LVOT:                   Appears normal  Cavum:                 Previously seen        Aortic Arch:            Previously seen  Ventricles:             Appears normal         Ductal Arch:            Appears normal  Choroid Plexus:        Previously seen        Diaphragm:              Appears normal  Cerebellum:            Previously seen        Stomach:                Appears normal, left                                                                        sided  Posterior Fossa:       Previously seen        Abdomen:                Appears normal  Nuchal Fold:           Previously seen        Abdominal Wall:         Previously seen  Face:                  Orbits and profile     Cord Vessels:           Previously seen                         previously seen  Lips:                  Previously seen        Kidneys:                Appear normal  Palate:                Previously seen        Bladder:                Appears normal  Thoracic:  Previously seen        Spine:                  Previously seen  Heart:                 Appears normal         Upper Extremities:      Previously seen                         (4CH, axis, and                         situs)  RVOT:                  Appears normal         Lower Extremities:      Previously seen  Other:  Parents do not wish to know sex of fetus. Heels/feet, open hands/5th          digits, nasal bone, lenses, VC, 3VV and 3VTV prev visualized. ---------------------------------------------------------------------- Doppler - Fetal Vessels  Umbilical Artery   S/D     %tile      RI    %tile      PI    %tile            ADFV    RDFV   2.41       33    0.58       35    0.82       32               No      No ---------------------------------------------------------------------- Cervix Uterus Adnexa  Cervix  Not visualized (advanced GA >24wks)  Uterus  Normal shape and size.  Right Ovary  Within normal limits.  Left Ovary  Within normal limits.  Cul De Sac  No free fluid seen.  Adnexa  No adnexal mass visualized. ---------------------------------------------------------------------- Comments  This patient was seen for a  follow up growth scan due to fetal  growth restriction. She denies any problems since her last  exam and reports feeling vigorous fetal movements  throughout the day.  On today's exam, the EFW measures at the 6th percentile for  her gestational age indicating fetal growth restriction.  The  fetus has grown over 1 pound over the past 3 weeks.  There  was normal amniotic fluid noted.  A biophysical profile performed today due to fetal growth  restriction was 10 out of 10 with a reactive NST.  Doppler studies of the umbilical arteries showed a normal  S/D ratio of 2.41.  There were no signs of absent or reversed  end-diastolic flow.  Due to fetal growth restriction, we will continue to follow her  with weekly fetal testing and umbilical artery Doppler studies.  Another BPP and umbilical artery Doppler study was  scheduled in 1 week. ----------------------------------------------------------------------                   Johnell Comings, MD Electronically Signed Final Report   04/04/2021 11:30 am ----------------------------------------------------------------------  [place] MFM UA CORD DOPPLER  Result Date: 03/28/2021 ----------------------------------------------------------------------  OBSTETRICS REPORT                       (Signed Final 03/28/2021 10:38 am) ---------------------------------------------------------------------- Patient Info  ID #:       [id number]  D.O.B.:  04-Aug-1985 (35 yrs)  Name:       ULYSSA CROWNOVER [facility]               Visit Date: 03/28/2021 08:03 am ---------------------------------------------------------------------- Performed By  Attending:        Tama High MD        Referred By:      Renard Matter MD  Performed By:     Eveline Keto         Location:         [name]                    RDMS                                     Fetal Care at                                                             [place] for                                                              Women ---------------------------------------------------------------------- Orders  #  Description                           Code        Ordered By  1  [place] MFM OB LIMITED                     76815.01    RAVI SHANKAR  2  [place] MFM UA CORD DOPPLER                [id number]    RAVI [name] ----------------------------------------------------------------------  #  Order #                     Accession #                Episode #  1  [id number]                   [id number]                 [id number]  2  [id number]                   [id number]                 [id number] ---------------------------------------------------------------------- Indications  [redacted] weeks gestation of pregnancy                Z3A.35  Maternal care for known or suspected poor      O36.5930  fetal growth, third trimester, not applicable or  unspecified IUGR  Advanced maternal age primigravida 67+,        O10.513  third trimester  Hyperemesis gravidarum  O21.0  Herpes simplex virus (HSV)                     O98.519 B00.9 ---------------------------------------------------------------------- Fetal Evaluation  Num Of Fetuses:         1  Fetal Heart Rate(bpm):  147  Cardiac Activity:       Observed  Presentation:           Cephalic  Placenta:               Anterior  Amniotic Fluid  AFI FV:      Within normal limits  AFI Sum(cm)     %Tile       Largest Pocket(cm)  8.86            5           2.72  RUQ(cm)       RLQ(cm)       LUQ(cm)        LLQ(cm)  2.72          1.36          2.39           2.39 ---------------------------------------------------------------------- OB History  Gravidity:    7         Term:   4         SAB:   1  TOP:          1 ---------------------------------------------------------------------- Gestational Age  LMP:           31w 0d        Date:  08/23/20                 EDD:   05/30/21  Best:          Burke Keels 0d     Det. By:  LMP  (08/23/20)          EDD:   05/30/21  ---------------------------------------------------------------------- Anatomy  Cranium:               Previously seen        LVOT:                   Appears normal  Cavum:                 Previously seen        Aortic Arch:            Previously seen  Ventricles:            Previously seen        Ductal Arch:            Appears normal  Choroid Plexus:        Previously seen        Diaphragm:              Appears normal  Cerebellum:            Previously seen        Stomach:                Appears normal, left  sided  Posterior Fossa:       Previously seen        Abdomen:                Appears normal  Nuchal Fold:           Previously seen        Abdominal Wall:         Previously seen  Face:                  Orbits and profile     Cord Vessels:           Previously seen                         previously seen  Lips:                  Previously seen        Kidneys:                Appear normal  Palate:                Previously seen        Bladder:                Appears normal  Thoracic:              Previously seen        Spine:                  Previously seen  Heart:                 Appears normal         Upper Extremities:      Previously seen                         (4CH, axis, and                         situs)  RVOT:                  Appears normal         Lower Extremities:      Previously seen  Other:  Parents do not wish to know sex of fetus. Heels/feet, open hands/5th          digits, nasal bone, lenses, VC, 3VV and 3VTV prev visualized. ---------------------------------------------------------------------- Doppler - Fetal Vessels  Umbilical Artery   S/D     %tile      RI    %tile      PI    %tile   2.75       50    0.64       55    0.87       36 ---------------------------------------------------------------------- Impression  Severe fetal growth restriction.  On ultrasound performed 2  weeks ago, the estimated fetal weight was at  the 3rd  percentile.  Amniotic fluid is normal and good fetal activity seen.  Umbilical artery Doppler showed normal forward diastolic  flow.  NST is reactive (confirmed on Obix and not on strip). ---------------------------------------------------------------------- Recommendations  -NST on 03/31/21  -BPP, NST and UA Doppler from next week.  -Fetal growth assessment next week. ----------------------------------------------------------------------                  Tama High, MD Electronically  Signed Final Report   03/28/2021 10:38 am ----------------------------------------------------------------------   Assessment and Plan:  Pregnancy: [id number] at [redacted]w[redacted]d 1. Supervision of high risk pregnancy, antepartum Continue routine prenatal care,  GBS at next visit, start HSV prophylaxis next visit  2. [redacted] weeks gestation of pregnancy   3. Poor fetal growth affecting management of mother in third trimester, single or unspecified fetus BPP 8/10 today, EFW 9% MFM recommends IOL at 37-38 weeks IOL scheduled at 19 weeks  4. Antepartum multigravida of advanced maternal age   66. History of ELISA positive for HSV Prophylaxis at 36 weeks  6. Nausea and vomiting during pregnancy Pt offered zofran and phenergan, she stated she had phenergan at home already and the meds were not fully effective  Preterm labor symptoms and general obstetric precautions including but not limited to vaginal bleeding, contractions, leaking of fluid and fetal movement were reviewed in detail with the patient. I discussed the assessment and treatment plan with the patient. The patient was provided an opportunity to ask questions and all were answered. The patient agreed with the plan and demonstrated an understanding of the instructions. The patient was advised to call back or seek an in-person office evaluation/go to MAU at [facility] for any urgent or concerning symptoms. Please refer to After Visit Summary for  other counseling recommendations.   I provided 12 minutes of face-to-face time during this encounter.  Return in about 1 week (around 05/02/2021) for [name], in person, 36 weeks swabs.  Future Appointments  Date Time Provider [place]  04/25/2021  1:15 PM Valerie Salts, RN [name]  04/30/2021  9:15 AM [facility] NURSE [name]  04/30/2021  9:30 AM [facility] US3 [facility]  04/30/2021 10:45 AM [facility] NST [name]  05/01/2021  1:35 PM Helaine Chess [name]  05/09/2021  8:55 AM Gabriel Carina, CNM [name]  05/09/2021 10:15 AM [facility] NURSE [name]  05/09/2021 10:30 AM [facility] US3 [name]  05/09/2021  1:15 PM [facility] NST [name]  05/15/2021  9:35 AM Donnamae Jude, MD [name]  05/22/2021  8:35 AM Anyanwu, Sallyanne Havers, MD [name]    Griffin Basil, MD [name], [facility] [place]

## 2021-04-25 NOTE — Procedures (Signed)
Mercedez M Roache ?[birth year] ?[redacted]w[redacted]d ? ?Fetus A Non-Stress Test Interpretation for 04/25/21 ? ?Indication: Unsatisfactory BPP ? ?Fetal Heart Rate A ?Mode: External ?Baseline Rate (A): 140 bpm ?Variability: Moderate ?Accelerations: 15 x 15 ?Decelerations: None ?Multiple birth?: No ? ?Uterine Activity ?Mode: Toco ?Contraction Frequency (min): one in 20 min (not felt by pt) ?Contraction Duration (sec): 60 ?Contraction Quality: Mild ?Resting Tone Palpated: Relaxed ? ?Interpretation (Fetal Testing) ?Nonstress Test Interpretation: Reactive ?Overall Impression: Reassuring for gestational age ?Comments: tracing reviewed by Dr. Annamaria Boots ? ? ?

## 2021-04-30 ENCOUNTER — Telehealth (HOSPITAL_COMMUNITY): Payer: Self-pay | Admitting: *Deleted

## 2021-04-30 ENCOUNTER — Encounter: Payer: Self-pay | Admitting: *Deleted

## 2021-04-30 ENCOUNTER — Other Ambulatory Visit: Payer: Self-pay

## 2021-04-30 ENCOUNTER — Ambulatory Visit: Payer: Medicaid Other | Admitting: *Deleted

## 2021-04-30 ENCOUNTER — Ambulatory Visit: Payer: Medicaid Other | Attending: Maternal & Fetal Medicine

## 2021-04-30 ENCOUNTER — Other Ambulatory Visit: Payer: Self-pay | Admitting: Maternal & Fetal Medicine

## 2021-04-30 VITALS — BP 111/62 | HR 97

## 2021-04-30 DIAGNOSIS — O0990 Supervision of high risk pregnancy, unspecified, unspecified trimester: Secondary | ICD-10-CM | POA: Insufficient documentation

## 2021-04-30 DIAGNOSIS — O210 Mild hyperemesis gravidarum: Secondary | ICD-10-CM

## 2021-04-30 DIAGNOSIS — O09523 Supervision of elderly multigravida, third trimester: Secondary | ICD-10-CM

## 2021-04-30 DIAGNOSIS — O365930 Maternal care for other known or suspected poor fetal growth, third trimester, not applicable or unspecified: Secondary | ICD-10-CM

## 2021-04-30 DIAGNOSIS — O98513 Other viral diseases complicating pregnancy, third trimester: Secondary | ICD-10-CM

## 2021-04-30 DIAGNOSIS — O365931 Maternal care for other known or suspected poor fetal growth, third trimester, fetus 1: Secondary | ICD-10-CM

## 2021-04-30 DIAGNOSIS — Z3A35 35 weeks gestation of pregnancy: Secondary | ICD-10-CM

## 2021-04-30 DIAGNOSIS — B009 Herpesviral infection, unspecified: Secondary | ICD-10-CM

## 2021-04-30 NOTE — Telephone Encounter (Signed)
Preadmission screen  

## 2021-04-30 NOTE — Procedures (Signed)
Breanna [name] ?1985/08/14 ?[redacted]w[redacted]d ? ?Fetus A Non-Stress Test Interpretation for 04/30/21 ? ?Indication: IUGR ? ?Fetal Heart Rate A ?Mode: External ?Baseline Rate (A): 145 bpm ?Variability: Moderate ?Accelerations: 15 x 15 ?Decelerations: None ?Multiple birth?: No ? ?Uterine Activity ?Mode: Toco ?Contraction Frequency (min): one u/c in 20 min ?Contraction Duration (sec): 60 ?Contraction Quality: Mild ?Resting Tone Palpated: Relaxed ? ?Interpretation (Fetal Testing) ?Nonstress Test Interpretation: Reactive ?Overall Impression: Reassuring for gestational age ?Comments: tracing reveiwed by Dr. Judeth Cornfield ? ? ?

## 2021-05-01 ENCOUNTER — Telehealth (HOSPITAL_COMMUNITY): Payer: Self-pay | Admitting: *Deleted

## 2021-05-01 ENCOUNTER — Ambulatory Visit (INDEPENDENT_AMBULATORY_CARE_PROVIDER_SITE_OTHER): Payer: Medicaid Other | Admitting: Certified Nurse Midwife

## 2021-05-01 ENCOUNTER — Encounter (HOSPITAL_COMMUNITY): Payer: Self-pay | Admitting: *Deleted

## 2021-05-01 ENCOUNTER — Other Ambulatory Visit (HOSPITAL_COMMUNITY)
Admission: RE | Admit: 2021-05-01 | Discharge: 2021-05-01 | Disposition: A | Discharge status: Home / Self Care | Payer: Medicaid Other | Source: Ambulatory Visit | Attending: Certified Nurse Midwife | Admitting: Certified Nurse Midwife

## 2021-05-01 VITALS — BP 119/75 | HR 99 | Wt 149.5 lb

## 2021-05-01 DIAGNOSIS — Z8619 Personal history of other infectious and parasitic diseases: Secondary | ICD-10-CM | POA: Diagnosis not present

## 2021-05-01 DIAGNOSIS — O99343 Other mental disorders complicating pregnancy, third trimester: Secondary | ICD-10-CM | POA: Insufficient documentation

## 2021-05-01 DIAGNOSIS — O219 Vomiting of pregnancy, unspecified: Secondary | ICD-10-CM

## 2021-05-01 DIAGNOSIS — Z3A35 35 weeks gestation of pregnancy: Secondary | ICD-10-CM

## 2021-05-01 DIAGNOSIS — O0993 Supervision of high risk pregnancy, unspecified, third trimester: Secondary | ICD-10-CM | POA: Insufficient documentation

## 2021-05-01 DIAGNOSIS — F418 Other specified anxiety disorders: Secondary | ICD-10-CM

## 2021-05-01 LAB — OB RESULTS CONSOLE GC/CHLAMYDIA: Gonorrhea: NEGATIVE

## 2021-05-01 NOTE — Telephone Encounter (Signed)
Preadmission screen  

## 2021-05-01 NOTE — Progress Notes (Signed)
? ?  PRENATAL VISIT NOTE ? ?Subjective:  ?Breanna [name] is a 36 y.o. [id number] at [redacted]w[redacted]d being seen today for ongoing prenatal care.  She is currently monitored for the following issues for this high-risk pregnancy and has Mixed anxiety depressive disorder; Fibromyalgia; PTSD (post-traumatic stress disorder); History of COVID-19; Supervision of high risk pregnancy, antepartum; Antepartum multigravida of advanced maternal age; IUGR (intrauterine growth restriction) affecting care of mother; History of ELISA positive for HSV; Unwanted fertility; and Nausea and vomiting during pregnancy on their problem list. ? ?Patient reports  continued nausea and vomiting with reflux and strongly desires to increase wellbutrin when she discharges from the hospital .  Contractions: Irritability. Vag. Bleeding: None.  Movement: Present. Denies leaking of fluid.  ? ?The following portions of the patient's history were reviewed and updated as appropriate: allergies, current medications, past family history, past medical history, past social history, past surgical history and problem list.  ? ?Objective:  ? ?Vitals:  ? 05/01/21 1351  ?BP: 119/75  ?Pulse: 99  ?Weight: 149 lb 8 oz (67.8 kg)  ? ? ?Fetal Status:     Movement: Present    ? ?General:  Alert, oriented and cooperative. Patient is in no acute distress.  ?Skin: Skin is warm and dry. No rash noted.   ?Cardiovascular: Normal heart rate noted  ?Respiratory: Normal respiratory effort, no problems with respiration noted  ?Abdomen: Soft, gravid, appropriate for gestational age.  Pain/Pressure: Absent     ?Pelvic: Cervical exam deferred        ?Extremities: Normal range of motion.  Edema: Trace  ?Mental Status: Normal mood and affect. Normal behavior. Normal judgment and thought content.  ? ?Assessment and Plan:  ?Pregnancy: [id number] at [redacted]w[redacted]d ?1. Supervision of high risk pregnancy in third trimester ?- Feeling plenty of baby movement ? ?2. [redacted] weeks gestation of pregnancy ?- GC/Chlamydia  probe amp ([place])not at [facility] ?- Culture, beta strep (group b only) ? ?3. Nausea and vomiting during pregnancy ?- Vomited while waiting for providers, is looking forward to the end of her pregnancy ? ?4. Mixed anxiety and depressive disorder ?- Requests to increase wellbutrin at hospital discharge to prevent postpartum mood destabilization ? ?5. History of ELISA positive for HSV ?- taking Valtrex as ordered ? ?Preterm labor symptoms and general obstetric precautions including but not limited to vaginal bleeding, contractions, leaking of fluid and fetal movement were reviewed in detail with the patient. ?Please refer to After Visit Summary for other counseling recommendations.  ? ?Follow up for postpartum care. ? ?Future Appointments  ?Date Time Provider [place]  ?05/09/2021  7:15 AM [facility] [place] [facility] None  ? ? ?Gabriel Carina, CNM ?

## 2021-05-02 LAB — GC/CHLAMYDIA PROBE AMP (~~LOC~~) NOT AT ARMC
Chlamydia: NEGATIVE
Comment: NEGATIVE
Comment: NORMAL
Neisseria Gonorrhea: NEGATIVE

## 2021-05-03 ENCOUNTER — Other Ambulatory Visit: Payer: Self-pay | Admitting: Advanced Practice Midwife

## 2021-05-06 ENCOUNTER — Other Ambulatory Visit: Payer: Self-pay

## 2021-05-06 ENCOUNTER — Encounter (HOSPITAL_COMMUNITY): Payer: Self-pay | Admitting: Obstetrics & Gynecology

## 2021-05-06 ENCOUNTER — Other Ambulatory Visit: Payer: Self-pay | Admitting: Obstetrics and Gynecology

## 2021-05-06 ENCOUNTER — Inpatient Hospital Stay (HOSPITAL_COMMUNITY)
Admission: AD | Admit: 2021-05-06 | Discharge: 2021-05-06 | Disposition: A | Discharge status: Home / Self Care | Payer: Medicaid Other | Attending: Obstetrics & Gynecology | Admitting: Obstetrics & Gynecology

## 2021-05-06 DIAGNOSIS — Z3A36 36 weeks gestation of pregnancy: Secondary | ICD-10-CM | POA: Diagnosis not present

## 2021-05-06 DIAGNOSIS — E876 Hypokalemia: Secondary | ICD-10-CM | POA: Diagnosis not present

## 2021-05-06 DIAGNOSIS — O218 Other vomiting complicating pregnancy: Secondary | ICD-10-CM | POA: Diagnosis present

## 2021-05-06 DIAGNOSIS — K529 Noninfective gastroenteritis and colitis, unspecified: Secondary | ICD-10-CM | POA: Insufficient documentation

## 2021-05-06 DIAGNOSIS — O99283 Endocrine, nutritional and metabolic diseases complicating pregnancy, third trimester: Secondary | ICD-10-CM | POA: Insufficient documentation

## 2021-05-06 DIAGNOSIS — O26893 Other specified pregnancy related conditions, third trimester: Secondary | ICD-10-CM | POA: Diagnosis not present

## 2021-05-06 LAB — COMPREHENSIVE METABOLIC PANEL
ALT: 8 U/L (ref 0–44)
AST: 15 U/L (ref 15–41)
Albumin: 2.5 g/dL — ABNORMAL LOW (ref 3.5–5.0)
Alkaline Phosphatase: 167 U/L — ABNORMAL HIGH (ref 38–126)
Anion gap: 13 (ref 5–15)
BUN: <5 mg/dL — ABNORMAL LOW (ref 6–20)
CO2: 18 mmol/L — ABNORMAL LOW (ref 22–32)
Calcium: 8.3 mg/dL — ABNORMAL LOW (ref 8.9–10.3)
Chloride: 104 mmol/L (ref 98–111)
Creatinine, Ser: 0.55 mg/dL (ref 0.44–1.00)
GFR, Estimated: >60 mL/min (ref >60)
Glucose, Bld: 76 mg/dL (ref 70–99)
Potassium: 3 mmol/L — ABNORMAL LOW (ref 3.5–5.1)
Sodium: 135 mmol/L (ref 135–145)
Total Bilirubin: 0.5 mg/dL (ref 0.3–1.2)
Total Protein: 6.6 g/dL (ref 6.5–8.1)

## 2021-05-06 LAB — URINALYSIS, ROUTINE W REFLEX MICROSCOPIC
Bilirubin Urine: NEGATIVE
Glucose, UA: NEGATIVE mg/dL
Hgb urine dipstick: NEGATIVE
Ketones, ur: 80 mg/dL — AB
Nitrite: NEGATIVE
Protein, ur: 100 mg/dL — AB
Specific Gravity, Urine: 1.025 (ref 1.005–1.030)
pH: 5 (ref 5.0–8.0)

## 2021-05-06 LAB — CULTURE, BETA STREP (GROUP B ONLY): Strep Gp B Culture: NEGATIVE

## 2021-05-06 MED ORDER — SODIUM CHLORIDE 0.9 % IV SOLN
8.0000 mg | Freq: Once | INTRAVENOUS | Status: AC
  Administered 2021-05-06: 8 mg via INTRAVENOUS
  Filled 2021-05-06: qty 4

## 2021-05-06 MED ORDER — FAMOTIDINE IN NACL 20-0.9 MG/50ML-% IV SOLN
20.0000 mg | Freq: Once | INTRAVENOUS | Status: AC
  Administered 2021-05-06: 20 mg via INTRAVENOUS
  Filled 2021-05-06: qty 50

## 2021-05-06 MED ORDER — ALUM & MAG HYDROXIDE-SIMETH 200-200-20 MG/5ML PO SUSP
30.0000 mL | Freq: Once | ORAL | Status: AC
  Administered 2021-05-06: 30 mL via ORAL
  Filled 2021-05-06: qty 30

## 2021-05-06 MED ORDER — LIDOCAINE VISCOUS HCL 2 % MT SOLN
15.0000 mL | Freq: Once | OROMUCOSAL | Status: AC
  Administered 2021-05-06: 15 mL via ORAL
  Filled 2021-05-06: qty 15

## 2021-05-06 MED ORDER — PROCHLORPERAZINE EDISYLATE 10 MG/2ML IJ SOLN
10.0000 mg | Freq: Once | INTRAMUSCULAR | Status: AC
Start: 2021-05-06 — End: 2021-05-06
  Administered 2021-05-06: 10 mg via INTRAVENOUS
  Filled 2021-05-06: qty 2

## 2021-05-06 MED ORDER — POTASSIUM CHLORIDE CRYS ER 20 MEQ PO TBCR: 20.0000 meq | EXTENDED_RELEASE_TABLET | Freq: Every day | ORAL | 0 refills | Status: DC

## 2021-05-06 MED ORDER — PROMETHAZINE HCL 25 MG PO TABS: 25.0000 mg | ORAL_TABLET | Freq: Four times a day (QID) | ORAL | 1 refills | Status: DC | PRN

## 2021-05-06 MED ORDER — ONDANSETRON HCL 8 MG PO TABS: 8.0000 mg | ORAL_TABLET | Freq: Three times a day (TID) | ORAL | 1 refills | Status: DC | PRN

## 2021-05-06 MED ORDER — LACTATED RINGERS IV BOLUS
1000.0000 mL | Freq: Once | INTRAVENOUS | Status: AC
  Administered 2021-05-06: 1000 mL via INTRAVENOUS

## 2021-05-06 MED ORDER — LACTATED RINGERS IV BOLUS
1000.0000 mL | Freq: Once | INTRAVENOUS | Status: AC
Start: 2021-05-06 — End: 2021-05-06
  Administered 2021-05-06: 1000 mL via INTRAVENOUS

## 2021-05-06 NOTE — MAU Note (Signed)
.  Breanna [name] is a 36 y.o. at [redacted]w[redacted]d here in MAU reporting: N/V since 2200 last night, states unable to keep anything down.  Also reports constant burning & tightness in chest along with tingling & numbness of extremities. ? ?Onset of complaint: last night since 2200 ?Pain score: 7 ?Vitals:  ? 05/06/21 1110  ?BP: 119/64  ?Pulse: (!) 102  ?Resp: 20  ?Temp: 98.4 ?F (36.9 ?C)  ?SpO2: 97%  ?   ?FHT: 150  bpm w/ +FM.  Denies VB or LOF. ?Lab orders placed from triage:    ?

## 2021-05-06 NOTE — MAU Provider Note (Signed)
?History  ?  ? ?CSN: [id number] ? ?Arrival date and time: 05/06/21 1038 ? ? Event Date/Time  ? First Provider Initiated Contact with Patient 05/06/21 1152   ?  ? ?Chief Complaint  ?Patient presents with  ? Nausea  ? Emesis  ? Chest Pain  ? ?HPI ? ?Ms.Breanna [name] is a 35 y.o. female [id number] @ [redacted]w[redacted]d here with nausea, vomiting, diarrhea. The symptoms started last night at 2200. No sick contacts. No one else in the house is sick. She reports several episodes of N/V and several episodes of diarrhea. She reports not being able to keep down fluids or solids. She reports GERD/burning in her chest from vomiting. She has been taking pepcid once daily which helps some. She has had severe nausea and vomiting in the pregnancy, however has not had diarrhea.  ? ?No flank pain, no fever.  ? ?OB History   ? ? Gravida  ?7  ? Para  ?4  ? Term  ?4  ? Preterm  ?0  ? AB  ?2  ? Living  ?4  ?  ? ? SAB  ?1  ? IAB  ?1  ? Ectopic  ?0  ? Multiple  ?0  ? Live Births  ?4  ?   ?  ?  ? ? ?Past Medical History:  ?Diagnosis Date  ? Anemia   ? Anxiety   ? BV (bacterial vaginosis)   ? Depression   ? Herpes genitalia   ? History of postpartum depression, currently pregnant   ? PTSD (post-traumatic stress disorder)   ? UTI (lower urinary tract infection)   ? ? ?Past Surgical History:  ?Procedure Laterality Date  ? APPENDECTOMY  2018  ? MOUTH SURGERY    ? RHINOPLASTY    ? ? ?Family History  ?Problem Relation Age of Onset  ? Healthy Mother   ? Healthy Father   ? Thyroid disease Maternal Grandmother   ? Bipolar disorder Maternal Grandmother   ? Diabetes Paternal Grandmother   ? Heart disease Paternal Grandmother   ? Hypertension Paternal Grandmother   ? Alcohol abuse Neg Hx   ? Arthritis Neg Hx   ? Asthma Neg Hx   ? Birth defects Neg Hx   ? Cancer Neg Hx   ? COPD Neg Hx   ? Depression Neg Hx   ? Drug abuse Neg Hx   ? Early death Neg Hx   ? Hearing loss Neg Hx   ? Hyperlipidemia Neg Hx   ? Learning disabilities Neg Hx   ? Kidney disease Neg Hx   ?  Mental illness Neg Hx   ? Mental retardation Neg Hx   ? Miscarriages / Stillbirths Neg Hx   ? Stroke Neg Hx   ? Vision loss Neg Hx   ? ? ?Social History  ? ?Tobacco Use  ? Smoking status: Former  ?  Types: Cigarettes  ?  Quit date: 02/17/2012  ?  Years since quitting: 9.2  ? Smokeless tobacco: Never  ?Vaping Use  ? Vaping Use: Never used  ?Substance Use Topics  ? Alcohol use: Not Currently  ?  Comment: ocasionally  ? Drug use: No  ? ? ?Allergies:  ?Allergies  ?Allergen Reactions  ? Latex Itching  ?  Vaginal irritation  ? Scopolamine Rash  ? Tape Rash  ? ? ?Medications Prior to Admission  ?Medication Sig Dispense Refill Last Dose  ? buPROPion (WELLBUTRIN XL) 150 MG 24 hr tablet Take 1 tablet (150 mg total)   by mouth in the morning and at bedtime. 60 tablet 5 Past Week  ? famotidine (PEPCID) 20 MG tablet Take 1 tablet (20 mg total) by mouth 2 (two) times daily. 60 tablet 0 05/05/2021  ? metoCLOPramide (REGLAN) 5 MG tablet Take 1 tablet (5 mg total) by mouth every 8 (eight) hours as needed for nausea or refractory nausea / vomiting. 60 tablet 0 05/05/2021  ? omeprazole (PRILOSEC OTC) 20 MG tablet Take 1 tablet (20 mg total) by mouth daily. 30 tablet 0 05/05/2021  ? valACYclovir (VALTREX) 500 MG tablet Take 1 tablet (500 mg total) by mouth 2 (two) times daily. 60 tablet 6 05/05/2021  ? blood glucose meter kit and supplies KIT Dispense based on patient and insurance preference. Use four times daily as directed. (Patient not taking: Reported on 04/10/2021) 1 each 0   ? Magnesium Oxide (MAG-OXIDE) 200 MG TABS Take 2 tablets (400 mg total) by mouth at bedtime. If that amount causes loose stools in the am, switch to 274m daily at bedtime. (Patient not taking: Reported on 03/14/2021) 60 tablet 3   ? ondansetron (ZOFRAN) 8 MG tablet Take 8 mg by mouth every 8 (eight) hours as needed for nausea or vomiting. (Patient not taking: Reported on 03/14/2021)     ? promethazine (PHENERGAN) 25 MG tablet Take 25 mg by mouth every 6 (six) hours as  needed for nausea or vomiting. (Patient not taking: Reported on 03/14/2021)     ? ?Results for orders placed or performed during the hospital encounter of 05/06/21 (from the past 48 hour(s))  ?Comprehensive metabolic panel     Status: Abnormal  ? Collection Time: 05/06/21 12:00 PM  ?Result Value Ref Range  ? Sodium 135 135 - 145 mmol/L  ? Potassium 3.0 (L) 3.5 - 5.1 mmol/L  ? Chloride 104 98 - 111 mmol/L  ? CO2 18 (L) 22 - 32 mmol/L  ? Glucose, Bld 76 70 - 99 mg/dL  ?  Comment: Glucose reference range applies only to samples taken after fasting for at least 8 hours.  ? BUN <5 (L) 6 - 20 mg/dL  ? Creatinine, Ser 0.55 0.44 - 1.00 mg/dL  ? Calcium 8.3 (L) 8.9 - 10.3 mg/dL  ? Total Protein 6.6 6.5 - 8.1 g/dL  ? Albumin 2.5 (L) 3.5 - 5.0 g/dL  ? AST 15 15 - 41 U/L  ? ALT 8 0 - 44 U/L  ? Alkaline Phosphatase 167 (H) 38 - 126 U/L  ? Total Bilirubin 0.5 0.3 - 1.2 mg/dL  ? GFR, Estimated >60 >60 mL/min  ?  Comment: (NOTE) ?Calculated using the CKD-EPI Creatinine Equation (2021) ?  ? Anion gap 13 5 - 15  ?  Comment: Performed at [facility] [address], [place] [address] ?Urinalysis, Routine w reflex microscopic Urine, Clean Catch     Status: Abnormal  ? Collection Time: 05/06/21 12:00 PM  ?Result Value Ref Range  ? Color, Urine AMBER (A) YELLOW  ?  Comment: BIOCHEMICALS MAY BE AFFECTED BY COLOR  ? APPearance HAZY (A) CLEAR  ? Specific Gravity, Urine 1.025 1.005 - 1.030  ? pH 5.0 5.0 - 8.0  ? Glucose, UA NEGATIVE NEGATIVE mg/dL  ? Hgb urine dipstick NEGATIVE NEGATIVE  ? Bilirubin Urine NEGATIVE NEGATIVE  ? Ketones, ur 80 (A) NEGATIVE mg/dL  ? Protein, ur 100 (A) NEGATIVE mg/dL  ? Nitrite NEGATIVE NEGATIVE  ? Leukocytes,Ua MODERATE (A) NEGATIVE  ? RBC / HPF 0-5 0 - 5  RBC/hpf  ? WBC, UA 21-50 0 - 5 WBC/hpf  ? Bacteria, UA MANY (A) NONE SEEN  ? Squamous Epithelial / LPF 11-20 0 - 5  ? Mucus PRESENT   ?  Comment: Performed at [facility], [address] [place], [place] [address]  ?  ? ?Review of  Systems  ?Gastrointestinal:  Positive for diarrhea, nausea and vomiting. Negative for abdominal pain.  ?Genitourinary:  Negative for pelvic pain, vaginal bleeding and vaginal discharge.  ?Physical Exam  ? ?Blood pressure 112/65, pulse (!) 119, temperature 98.4 ?F (36.9 ?C), temperature source Oral, resp. rate 16, height 5' 4" (1.626 m), weight 67.6 kg, last menstrual period 08/23/2020, SpO2 99 %, unknown if currently breastfeeding. ? ?Patient Vitals for the past 24 hrs: ? BP Temp Temp src Pulse Resp SpO2 Height Weight  ?05/06/21 1507 112/65 -- -- (!) 119 16 99 % -- --  ?05/06/21 1347 (!) 102/44 -- -- (!) 101 -- -- -- --  ?05/06/21 1110 119/64 98.4 ?F (36.9 ?C) Oral (!) 102 20 97 % -- --  ?05/06/21 1103 -- -- -- -- -- -- 5' 4" (1.626 m) 67.6 kg  ?  ? ?Physical Exam ?Constitutional:   ?   General: She is not in acute distress. ?   Appearance: Normal appearance. She is ill-appearing.  ?Musculoskeletal:     ?   General: Normal range of motion.  ?Skin: ?   General: Skin is warm.  ?Neurological:  ?   Mental Status: She is alert and oriented to person, place, and time.  ?Psychiatric:     ?   Behavior: Behavior normal.  ? ?Fetal Tracing: ?Baseline: 135 bpm ?Variability: Moderate  ?Accelerations: 15x15 ?Decelerations: None ?Toco: None ? ?MAU Course  ?Procedures ?None ? ?MDM ? ?LR bolus x 2 ?Zofran 8 mg IV, Pepcid 20 ?Patient tolerating oral fluids. ?Attempted to given gI cocktail, patient vomited this.  ?Compazine given IV and patient able to tolerate oral fluids.  ? ?Assessment and Plan  ? ?A: ? ?1. Gastroenteritis   ?2. [redacted] weeks gestation of pregnancy   ?3. Hypokalemia   ?  ? ?P: ? ?Discharge home ?Rx: Zofran, Phenergan, Kdur ?Continue pepcid at home ?Return to MAU if symptoms worsen ?BRAT diet ?Strict return precautions, patient states she feels Ok to go home. She is to return is she is not able to keep anything down.  ? ?,  I, NP ?05/06/2021 ?5:56 PM ? ?

## 2021-05-06 NOTE — Progress Notes (Signed)
kdur  

## 2021-05-07 ENCOUNTER — Encounter (HOSPITAL_COMMUNITY): Payer: Self-pay | Admitting: Obstetrics and Gynecology

## 2021-05-09 ENCOUNTER — Other Ambulatory Visit: Payer: Self-pay

## 2021-05-09 ENCOUNTER — Ambulatory Visit: Payer: Medicaid Other

## 2021-05-09 ENCOUNTER — Encounter (HOSPITAL_COMMUNITY): Payer: Self-pay | Admitting: Obstetrics and Gynecology

## 2021-05-09 ENCOUNTER — Inpatient Hospital Stay (HOSPITAL_COMMUNITY): Payer: Medicaid Other

## 2021-05-09 ENCOUNTER — Inpatient Hospital Stay (HOSPITAL_COMMUNITY): Payer: Self-pay | Admitting: Anesthesiology

## 2021-05-09 ENCOUNTER — Encounter (HOSPITAL_COMMUNITY): Payer: Self-pay | Admitting: Anesthesiology

## 2021-05-09 ENCOUNTER — Inpatient Hospital Stay (HOSPITAL_COMMUNITY)
Admission: AD | Admit: 2021-05-09 | Discharge: 2021-05-11 | DRG: 806 | Disposition: A | Discharge status: Home / Self Care | Payer: Medicaid Other | Attending: Obstetrics and Gynecology | Admitting: Obstetrics and Gynecology

## 2021-05-09 ENCOUNTER — Encounter: Payer: Self-pay | Admitting: Certified Nurse Midwife

## 2021-05-09 DIAGNOSIS — D509 Iron deficiency anemia, unspecified: Secondary | ICD-10-CM | POA: Diagnosis present

## 2021-05-09 DIAGNOSIS — O09529 Supervision of elderly multigravida, unspecified trimester: Secondary | ICD-10-CM

## 2021-05-09 DIAGNOSIS — O99344 Other mental disorders complicating childbirth: Secondary | ICD-10-CM | POA: Diagnosis present

## 2021-05-09 DIAGNOSIS — Z3A37 37 weeks gestation of pregnancy: Secondary | ICD-10-CM

## 2021-05-09 DIAGNOSIS — Z8616 Personal history of COVID-19: Secondary | ICD-10-CM | POA: Diagnosis not present

## 2021-05-09 DIAGNOSIS — O365930 Maternal care for other known or suspected poor fetal growth, third trimester, not applicable or unspecified: Secondary | ICD-10-CM | POA: Diagnosis present

## 2021-05-09 DIAGNOSIS — F32A Depression, unspecified: Secondary | ICD-10-CM | POA: Diagnosis present

## 2021-05-09 DIAGNOSIS — O9902 Anemia complicating childbirth: Secondary | ICD-10-CM | POA: Diagnosis present

## 2021-05-09 DIAGNOSIS — Z8619 Personal history of other infectious and parasitic diseases: Secondary | ICD-10-CM | POA: Diagnosis present

## 2021-05-09 DIAGNOSIS — O0990 Supervision of high risk pregnancy, unspecified, unspecified trimester: Principal | ICD-10-CM

## 2021-05-09 DIAGNOSIS — A6000 Herpesviral infection of urogenital system, unspecified: Secondary | ICD-10-CM | POA: Diagnosis present

## 2021-05-09 DIAGNOSIS — O9832 Other infections with a predominantly sexual mode of transmission complicating childbirth: Secondary | ICD-10-CM | POA: Diagnosis present

## 2021-05-09 DIAGNOSIS — Z3009 Encounter for other general counseling and advice on contraception: Secondary | ICD-10-CM | POA: Diagnosis present

## 2021-05-09 DIAGNOSIS — F418 Other specified anxiety disorders: Secondary | ICD-10-CM | POA: Diagnosis present

## 2021-05-09 DIAGNOSIS — O365931 Maternal care for other known or suspected poor fetal growth, third trimester, fetus 1: Secondary | ICD-10-CM | POA: Diagnosis present

## 2021-05-09 DIAGNOSIS — Z87891 Personal history of nicotine dependence: Secondary | ICD-10-CM

## 2021-05-09 HISTORY — DX: Hypokalemia: E87.6

## 2021-05-09 LAB — CBC
HCT: 25.3 % — ABNORMAL LOW (ref 36.0–46.0)
Hemoglobin: 7.8 g/dL — ABNORMAL LOW (ref 12.0–15.0)
MCH: 20.9 pg — ABNORMAL LOW (ref 26.0–34.0)
MCHC: 30.8 g/dL (ref 30.0–36.0)
MCV: 67.6 fL — ABNORMAL LOW (ref 80.0–100.0)
Platelets: 282 10*3/uL (ref 150–400)
RBC: 3.74 MIL/uL — ABNORMAL LOW (ref 3.87–5.11)
RDW: 17 % — ABNORMAL HIGH (ref 11.5–15.5)
WBC: 6.9 10*3/uL (ref 4.0–10.5)
nRBC: 0.3 % — ABNORMAL HIGH (ref 0.0–0.2)

## 2021-05-09 LAB — PREPARE RBC (CROSSMATCH)

## 2021-05-09 LAB — RPR: RPR Ser Ql: NONREACTIVE

## 2021-05-09 MED ORDER — DIBUCAINE (PERIANAL) 1 % EX OINT: 1.0000 "application " | TOPICAL_OINTMENT | CUTANEOUS | Status: DC | PRN

## 2021-05-09 MED ORDER — METOCLOPRAMIDE HCL 10 MG PO TABS
5.0000 mg | ORAL_TABLET | Freq: Three times a day (TID) | ORAL | Status: DC | PRN
  Administered 2021-05-09 (×2): 5 mg via ORAL
  Filled 2021-05-09 (×3): qty 1

## 2021-05-09 MED ORDER — MEDROXYPROGESTERONE ACETATE 150 MG/ML IM SUSP: 150.0000 mg | INTRAMUSCULAR | Status: DC | PRN

## 2021-05-09 MED ORDER — FENTANYL-BUPIVACAINE-NACL 0.5-0.125-0.9 MG/250ML-% EP SOLN
12.0000 mL/h | EPIDURAL | Status: DC | PRN
  Filled 2021-05-09: qty 250

## 2021-05-09 MED ORDER — SOD CITRATE-CITRIC ACID 500-334 MG/5ML PO SOLN: 30.0000 mL | ORAL | Status: DC | PRN

## 2021-05-09 MED ORDER — MISOPROSTOL 25 MCG QUARTER TABLET
25.0000 ug | ORAL_TABLET | ORAL | Status: DC | PRN
  Administered 2021-05-09: 25 ug via VAGINAL
  Filled 2021-05-09: qty 1

## 2021-05-09 MED ORDER — DOCUSATE SODIUM 100 MG PO CAPS
100.0000 mg | ORAL_CAPSULE | Freq: Two times a day (BID) | ORAL | Status: DC
  Administered 2021-05-10 – 2021-05-11 (×2): 100 mg via ORAL
  Filled 2021-05-09 (×2): qty 1

## 2021-05-09 MED ORDER — TETANUS-DIPHTH-ACELL PERTUSSIS 5-2.5-18.5 LF-MCG/0.5 IM SUSY: 0.5000 mL | PREFILLED_SYRINGE | Freq: Once | INTRAMUSCULAR | Status: DC

## 2021-05-09 MED ORDER — TRANEXAMIC ACID-NACL 1000-0.7 MG/100ML-% IV SOLN
1000.0000 mg | Freq: Once | INTRAVENOUS | Status: AC
  Administered 2021-05-09: 1000 mg via INTRAVENOUS
  Filled 2021-05-09: qty 100

## 2021-05-09 MED ORDER — PENICILLIN G POT IN DEXTROSE 60000 UNIT/ML IV SOLN: 3.0000 10*6.[IU] | INTRAVENOUS | Status: DC

## 2021-05-09 MED ORDER — OXYTOCIN-SODIUM CHLORIDE 30-0.9 UT/500ML-% IV SOLN: 2.5000 [IU]/h | INTRAVENOUS | Status: DC

## 2021-05-09 MED ORDER — MEASLES, MUMPS & RUBELLA VAC IJ SOLR: 0.5000 mL | Freq: Once | INTRAMUSCULAR | Status: DC

## 2021-05-09 MED ORDER — FERROUS SULFATE 325 (65 FE) MG PO TABS: 325.0000 mg | ORAL_TABLET | ORAL | Status: DC

## 2021-05-09 MED ORDER — COCONUT OIL OIL
1.0000 "application " | TOPICAL_OIL | Status: DC | PRN
  Administered 2021-05-11: 1 via TOPICAL

## 2021-05-09 MED ORDER — FAMOTIDINE 20 MG PO TABS
20.0000 mg | ORAL_TABLET | Freq: Two times a day (BID) | ORAL | Status: DC
  Administered 2021-05-09: 20 mg via ORAL
  Filled 2021-05-09: qty 1

## 2021-05-09 MED ORDER — SODIUM CHLORIDE 0.9 % IV SOLN
500.0000 mg | Freq: Once | INTRAVENOUS | Status: AC
  Administered 2021-05-10: 500 mg via INTRAVENOUS
  Filled 2021-05-09 (×2): qty 25

## 2021-05-09 MED ORDER — TERBUTALINE SULFATE 1 MG/ML IJ SOLN: 0.2500 mg | Freq: Once | INTRAMUSCULAR | Status: DC | PRN

## 2021-05-09 MED ORDER — ACETAMINOPHEN 325 MG PO TABS: 650.0000 mg | ORAL_TABLET | ORAL | Status: DC | PRN

## 2021-05-09 MED ORDER — FENTANYL CITRATE (PF) 100 MCG/2ML IJ SOLN: 50.0000 ug | INTRAMUSCULAR | Status: DC | PRN

## 2021-05-09 MED ORDER — IBUPROFEN 600 MG PO TABS
600.0000 mg | ORAL_TABLET | Freq: Four times a day (QID) | ORAL | Status: DC
  Administered 2021-05-09 – 2021-05-11 (×7): 600 mg via ORAL
  Filled 2021-05-09 (×7): qty 1

## 2021-05-09 MED ORDER — OXYTOCIN-SODIUM CHLORIDE 30-0.9 UT/500ML-% IV SOLN
1.0000 m[IU]/min | INTRAVENOUS | Status: DC
  Administered 2021-05-09: 2 m[IU]/min via INTRAVENOUS
  Filled 2021-05-09: qty 500

## 2021-05-09 MED ORDER — ONDANSETRON HCL 4 MG PO TABS: 4.0000 mg | ORAL_TABLET | ORAL | Status: DC | PRN

## 2021-05-09 MED ORDER — BUPROPION HCL ER (XL) 150 MG PO TB24
150.0000 mg | ORAL_TABLET | Freq: Two times a day (BID) | ORAL | Status: DC
  Administered 2021-05-09: 150 mg via ORAL
  Filled 2021-05-09 (×3): qty 1

## 2021-05-09 MED ORDER — EPHEDRINE 5 MG/ML INJ: 10.0000 mg | INTRAVENOUS | Status: DC | PRN

## 2021-05-09 MED ORDER — ONDANSETRON HCL 4 MG/2ML IJ SOLN: 4.0000 mg | Freq: Four times a day (QID) | INTRAMUSCULAR | Status: DC | PRN

## 2021-05-09 MED ORDER — BENZOCAINE-MENTHOL 20-0.5 % EX AERO
1.0000 "application " | INHALATION_SPRAY | CUTANEOUS | Status: DC | PRN
  Filled 2021-05-09: qty 56

## 2021-05-09 MED ORDER — SENNOSIDES-DOCUSATE SODIUM 8.6-50 MG PO TABS
2.0000 | ORAL_TABLET | Freq: Every day | ORAL | Status: DC
  Administered 2021-05-11: 2 via ORAL
  Filled 2021-05-09: qty 2

## 2021-05-09 MED ORDER — ONDANSETRON HCL 4 MG/2ML IJ SOLN: 4.0000 mg | INTRAMUSCULAR | Status: DC | PRN

## 2021-05-09 MED ORDER — WITCH HAZEL-GLYCERIN EX PADS: 1.0000 "application " | MEDICATED_PAD | CUTANEOUS | Status: DC | PRN

## 2021-05-09 MED ORDER — PHENYLEPHRINE 40 MCG/ML (10ML) SYRINGE FOR IV PUSH (FOR BLOOD PRESSURE SUPPORT): 80.0000 ug | PREFILLED_SYRINGE | INTRAVENOUS | Status: DC | PRN

## 2021-05-09 MED ORDER — PRENATAL MULTIVITAMIN CH
1.0000 | ORAL_TABLET | Freq: Every day | ORAL | Status: DC
  Administered 2021-05-10 – 2021-05-11 (×2): 1 via ORAL
  Filled 2021-05-09 (×2): qty 1

## 2021-05-09 MED ORDER — OXYCODONE-ACETAMINOPHEN 5-325 MG PO TABS: 1.0000 | ORAL_TABLET | ORAL | Status: DC | PRN

## 2021-05-09 MED ORDER — LACTATED RINGERS IV SOLN: INTRAVENOUS | Status: DC

## 2021-05-09 MED ORDER — PHENYLEPHRINE 40 MCG/ML (10ML) SYRINGE FOR IV PUSH (FOR BLOOD PRESSURE SUPPORT)
80.0000 ug | PREFILLED_SYRINGE | INTRAVENOUS | Status: DC | PRN
  Filled 2021-05-09: qty 10

## 2021-05-09 MED ORDER — LACTATED RINGERS IV SOLN
500.0000 mL | Freq: Once | INTRAVENOUS | Status: AC
  Administered 2021-05-09: 500 mL via INTRAVENOUS

## 2021-05-09 MED ORDER — DIPHENHYDRAMINE HCL 25 MG PO CAPS
25.0000 mg | ORAL_CAPSULE | Freq: Four times a day (QID) | ORAL | Status: DC | PRN
  Administered 2021-05-10: 25 mg via ORAL
  Filled 2021-05-09: qty 1

## 2021-05-09 MED ORDER — SODIUM CHLORIDE 0.9 % IV SOLN: 5.0000 10*6.[IU] | Freq: Once | INTRAVENOUS | Status: DC

## 2021-05-09 MED ORDER — OXYTOCIN BOLUS FROM INFUSION
333.0000 mL | Freq: Once | INTRAVENOUS | Status: AC
Start: 2021-05-09 — End: 2021-05-09
  Administered 2021-05-09: 333 mL via INTRAVENOUS

## 2021-05-09 MED ORDER — METHYLERGONOVINE MALEATE 0.2 MG/ML IJ SOLN: 0.2000 mg | INTRAMUSCULAR | Status: DC | PRN

## 2021-05-09 MED ORDER — LIDOCAINE HCL (PF) 1 % IJ SOLN: 30.0000 mL | INTRAMUSCULAR | Status: DC | PRN

## 2021-05-09 MED ORDER — DIPHENHYDRAMINE HCL 50 MG/ML IJ SOLN
12.5000 mg | INTRAMUSCULAR | Status: DC | PRN
  Administered 2021-05-09: 12.5 mg via INTRAVENOUS
  Filled 2021-05-09: qty 1

## 2021-05-09 MED ORDER — METHYLERGONOVINE MALEATE 0.2 MG PO TABS: 0.2000 mg | ORAL_TABLET | ORAL | Status: DC | PRN

## 2021-05-09 MED ORDER — SIMETHICONE 80 MG PO CHEW: 80.0000 mg | CHEWABLE_TABLET | ORAL | Status: DC | PRN

## 2021-05-09 MED ORDER — LACTATED RINGERS IV SOLN: 500.0000 mL | INTRAVENOUS | Status: DC | PRN

## 2021-05-09 NOTE — Discharge Summary (Addendum)
?  Postpartum Discharge Summary ? ?   ?Patient Name: Breanna [name] ?DOB: [birth year] ?MRN: [id number] ? ?Date of admission: 05/09/2021 ?Delivery date:05/09/2021  ?Delivering provider: AGBAZA, ONOME S  ?Date of discharge: 05/11/2021 ? ?Admitting diagnosis: IUGR (intrauterine growth restriction) affecting care of mother, third trimester, fetus 23 [O36.5931] ?Intrauterine pregnancy: [redacted]w[redacted]d     ?Secondary diagnosis:  Principal Problem: ?  IUGR (intrauterine growth restriction) affecting care of mother, third trimester, fetus 1 ?Active Problems: ?  Mixed anxiety depressive disorder ?  Supervision of high risk pregnancy, antepartum ?  Antepartum multigravida of advanced maternal age ?  History of ELISA positive for HSV ?  Unwanted fertility ?  SVD (spontaneous vaginal delivery) ? ?Additional problems: None    ?Discharge diagnosis: Term Pregnancy Delivered                                              ?Post partum procedures: Venofer transfusion ?Augmentation: AROM, Pitocin, and Cytotec ?Complications: None ? ?Hospital course: Induction of Labor With Vaginal Delivery   ?36 y.o. yo [id number] at [redacted]w[redacted]d was admitted to the hospital 05/09/2021 for induction of labor.  Indication for induction:  IUGR .  Patient had an uncomplicated labor course as follows: ? ?IOL d/t FGR (EFW 9%). Induction initiated with cytotec and transitioned to pitocin when cervix was favorable. AROM performed and patient progressed to complete. Patient had hemoglobin of 7.8 starting and was given IV venofer and patient was asymptomatic at discharge.  ? ?Membrane Rupture Time/Date: 2:15 PM ,05/09/2021   ?Delivery Method:Vaginal, Spontaneous  ?Episiotomy: None  ?Lacerations:  None  ?Details of delivery can be found in separate delivery note.  Patient had a routine postpartum course. Patient is discharged home 05/11/21. ? ?Newborn Data: ?Birth date:05/09/2021  ?Birth time:5:27 PM  ?Gender:Female  ?Living status:Living  ?Apgars:9 ,9  ?Weight:2540 g (5lb  9.6oz) ? ?Magnesium Sulfate received: No ?BMZ received: No ?Rhophylac:N/A ?MMR:N/A ?T-DaP: offer prior to discharge ?Flu: No ?Transfusion:No-Venofer x 1 ? ?Physical exam  ?Vitals:  ? 05/10/21 1050 05/10/21 1519 05/10/21 2005 05/11/21 0524  ?BP: 104/73 115/76 115/72 112/68  ?Pulse: 90 71 82 68  ?Resp: $Remov'20 18 18 16  'dMOAtl$ ?Temp: 98.4 ?F (36.9 ?C) 98.3 ?F (36.8 ?C) 98.7 ?F (37.1 ?C) 98.2 ?F (36.8 ?C)  ?TempSrc: Oral Oral Oral Oral  ?SpO2: 98% 100% 100% 100%  ? ?General: alert, cooperative, and no distress ?Lochia: appropriate ?Uterine Fundus: firm ?Incision: N/A ?DVT Evaluation: No evidence of DVT seen on physical exam. ?No significant calf/ankle edema. ?Labs: ?Lab Results  ?Component Value Date  ? WBC 6.9 05/09/2021  ? HGB 7.8 (L) 05/09/2021  ? HCT 25.3 (L) 05/09/2021  ? MCV 67.6 (L) 05/09/2021  ? PLT 282 05/09/2021  ? ? ?  Latest Ref Rng & Units 05/06/2021  ? 12:00 PM  ?CMP  ?Glucose 70 - 99 mg/dL 76    ?BUN 6 - 20 mg/dL <5    ?Creatinine 0.44 - 1.00 mg/dL 0.55    ?Sodium 135 - 145 mmol/L 135    ?Potassium 3.5 - 5.1 mmol/L 3.0    ?Chloride 98 - 111 mmol/L 104    ?CO2 22 - 32 mmol/L 18    ?Calcium 8.9 - 10.3 mg/dL 8.3    ?Total Protein 6.5 - 8.1 g/dL 6.6    ?Total Bilirubin 0.3 - 1.2 mg/dL 0.5    ?Alkaline Phos 38 -  126 U/L 167    ?AST 15 - 41 U/L 15    ?ALT 0 - 44 U/L 8    ? ?Edinburgh Score: ? ?  08/23/2019  ?  8:38 AM  ?Flavia Shipper Postnatal Depression Scale Screening Tool  ?I have been able to laugh and see the funny side of things. 1  ?I have looked forward with enjoyment to things. 1  ?I have blamed myself unnecessarily when things went wrong. 2  ?I have been anxious or worried for no good reason. 3  ?I have felt scared or panicky for no good reason. 3  ?Things have been getting on top of me. 3  ?I have been so unhappy that I have had difficulty sleeping. 1  ?I have felt sad or miserable. 1  ?I have been so unhappy that I have been crying. 1  ?The thought of harming myself has occurred to me. 0  ?Edinburgh Postnatal  Depression Scale Total 16  ? ? ? ?After visit meds:  ?Allergies as of 05/11/2021   ? ?   Reactions  ? Latex Itching  ? Vaginal irritation  ? Scopolamine Rash  ? Tape Rash  ? ?  ? ?  ?Medication List  ?  ? ?STOP taking these medications   ? ?famotidine 20 MG tablet ?Commonly known as: PEPCID ?  ?metoCLOPramide 5 MG tablet ?Commonly known as: Reglan ?  ?omeprazole 20 MG tablet ?Commonly known as: PriLOSEC OTC ?  ?ondansetron 8 MG tablet ?Commonly known as: ZOFRAN ?  ?potassium chloride SA 20 MEQ tablet ?Commonly known as: KLOR-CON M ?  ?promethazine 25 MG tablet ?Commonly known as: PHENERGAN ?  ?valACYclovir 500 MG tablet ?Commonly known as: VALTREX ?  ? ?  ? ?TAKE these medications   ? ?acetaminophen 500 MG tablet ?Commonly known as: TYLENOL ?Take 2 tablets (1,000 mg total) by mouth every 8 (eight) hours as needed (pain). ?  ?buPROPion 150 MG 24 hr tablet ?Commonly known as: Wellbutrin XL ?Take 1 tablet (150 mg total) by mouth in the morning and at bedtime. ?  ?ferrous sulfate 325 (65 FE) MG tablet ?Take 1 tablet (325 mg total) by mouth every other day. ?  ?ibuprofen 600 MG tablet ?Commonly known as: ADVIL ?Take 1 tablet (600 mg total) by mouth every 6 (six) hours as needed (pain). ?  ? ?  ? ? ? ?Discharge home in stable condition ?Infant Feeding: Breast ?Infant Disposition:home with mother ?Discharge instruction: per After Visit Summary and Postpartum booklet. ?Activity: Advance as tolerated. Pelvic rest for 6 weeks.  ?Diet: routine diet ?Future Appointments:No future appointments. ?Follow up Visit: ? ?Message sent to [facility] on 3/24 ? ?Please schedule this patient for a Virtual postpartum visit in 6 weeks with the following provider: Any provider. ?Additional Postpartum F/U:Postpartum Depression checkup  ?Low risk pregnancy complicated by:  IUGR ?Delivery mode:  Vaginal, Spontaneous  ?Anticipated Birth Control:   Vasectomy  (performed already, not wanting interim BC) ? ? ?05/11/2021 ?Gerrit Heck, MD ? ?CNM  attestation ?I have seen and examined this patient and agree with above documentation in the resident's note.  ? ?Breanna [name] is a 36 y.o. [id number] s/p vag del.   Pain is well controlled.  Plan for birth control is vasectomy.  Method of Feeding: breast ? ?PE:  ?BP 112/68 (BP Location: Right Arm)   Pulse 68   Temp 98.2 ?F (36.8 ?C) (Oral)   Resp 16   LMP 08/23/2020   SpO2 100%   Breastfeeding Unknown  ?  Fundus firm ? ?Recent Labs  ?  05/09/21 ?0608  ?HGB 7.8*  ?HCT 25.3*  ? ? ? ?Plan: discharge today ?- postpartum care discussed ?- po Fe for anemia ?- f/u clinic in 6 weeks for postpartum visit and BH check up in 1-2wks ? ? ?Myrtis Ser, CNM ?11:01 AM ?05/11/2021 ? ?  ? ? ?

## 2021-05-09 NOTE — Progress Notes (Addendum)
LABOR PROGRESS NOTE ? ?Subjective: Patient is comfortable at bs with doula at bs for support. Reports no significant discomfort or feeling or pressure ? ?Objective: ?Blood pressure (!) 101/53, pulse 96, temperature 97.9 ?F (36.6 ?C), temperature source Oral, resp. rate 17, last menstrual period 08/23/2020, unknown if currently breastfeeding. ?Temp (24hrs), Avg:98.1 ?F (36.7 ?C), Min:97.9 ?F (36.6 ?C), Max:98.3 ?F (36.8 ?C) ? ? ?Fetal Heart Tones ?Baseline: 135 ?Variability: moderate ?Accelerations: present, 15x15  ?Decelerations: absent ?Overall assessment: cat 1 ? ?Contractions: q3-5 ? ?Cervical Exam: 5.5/70%/-2; AROM, clear  ? ?ASSESSMENT AND PLAN:   ?IUP at 370d mIOL for FGR ? ?Continue current management with pitocin ?AROM now ?Epidural PRN ?Recheck in 4 hours ? ?Electronically Signed By: ?Ardyth Harps, SNM ?05/09/21 2:21 PM   ? ?Attestation of CNM Supervision of midwife student: Evaluation and management procedures were performed by the midwife student under my supervision. I was immediately available for direct supervision, assistance and direction throughout this encounter.  I also confirm that I have verified the information documented in the resident?s note, and that I have also personally reperformed the pertinent components of the physical exam and all of the medical decision making activities.  I have also made any necessary editorial changes. ? ?R/B/A of AROM discussed. Verbal consent given by patient. AROM with moderate amount of clear fluid. Patient 5.5cm. Patient and fetus tolerated well.  ? ? ?Brand Males, CNM ?05/09/2021 2:41 PM ? ? ?

## 2021-05-09 NOTE — Lactation Note (Addendum)
This note was copied from a baby's chart. ?Lactation Consultation Note ? ?Patient Name: Breanna [name] ?Today's Date: 05/09/2021 ?Reason for consult: Initial assessment;Mother's request;Early term 37-38.6wks;Breastfeeding assistance;Other (Comment) (Anemia, Wellbutrin, Famotidine) ?Age:36 hours ? ?Breanna [name] ( L3 Wellbutrin) Breanna [name] ( L1 Famotidine) ? ?LC assisted with latching infant at the breast with signs of milk transfer.  ?Mom feeding plan to EBF and offer back her expressed milk as supplementation.  ? ?LPTI guidelines reviewed given infant 37 weeks and less than 6 lbs.  ? ?Plan 1. To feed based on cues 8-12x 24hr period. Mom to offer breasts and look for signs of milk transfer.  ?2. Mom to supplement with EBM 5-10 ml per feeding. If 5 ml or less on a spoon, if more with pace bottle feeding and slow flow nipple.  ?3. DEBP q 3hrs for  ?All questions answered at the end of the visit.  ? ?Maternal Data ?Has patient been taught Hand Expression?: Yes ?Does the patient have breastfeeding experience prior to this delivery?: Yes ?How long did the patient breastfeed?: 18 months with last child currently 76 months old ? ?Feeding ?Mother's Current Feeding Choice: Breast Milk ? ?LATCH Score ?Latch: Grasps breast easily, tongue down, lips flanged, rhythmical sucking. ? ?Audible Swallowing: Spontaneous and intermittent ? ?Type of Nipple: Everted at rest and after stimulation ? ?Comfort (Breast/Nipple): Soft / non-tender ? ?Hold (Positioning): Assistance needed to correctly position infant at breast and maintain latch. ? ?LATCH Score: 9 ? ? ?Lactation Tools Discussed/Used ?Tools: Pump;Flanges ?Flange Size: 27 ?Breast pump type: Double-Electric Breast Pump ?Pump Education: Setup, frequency, and cleaning;Milk Storage ?Reason for Pumping: increase stimulation ?Pumping frequency: every 3 hrs for 15 min ? ?Interventions ?Interventions: Breast feeding basics reviewed;Assisted with latch;Skin to skin;Breast massage;Hand  express;Breast compression;Adjust position;Support pillows;Position options;Expressed milk;DEBP;Pace feeding;Education;LC Psychologist, educational;Infant Driven Feeding Algorithm education;LPT handout/interventions ? ?Discharge ?Pump: Personal ?WIC Program: No ? ?Consult Status ?Consult Status: Follow-up ?Date: 05/10/21 ?Follow-up type: In-patient ? ? ? ?Breanna Reh  [name] ?05/09/2021, 10:12 PM ? ? ? ?

## 2021-05-09 NOTE — Anesthesia Preprocedure Evaluation (Signed)
Anesthesia Evaluation  ?Patient identified by MRN, date of birth, ID band ? ?Reviewed: ?Allergy & Precautions, Patient's Chart, lab work & pertinent test results ? ?Airway ?Mallampati: II ? ?TM Distance: >3 FB ?Neck ROM: Full ? ? ? Dental ?no notable dental hx. ?(+) Teeth Intact ?  ?Pulmonary ?former smoker,  ?  ?Pulmonary exam normal ?breath sounds clear to auscultation ? ? ? ? ? ? Cardiovascular ?negative cardio ROS ?Normal cardiovascular exam ?Rhythm:Regular Rate:Normal ? ? ?  ?Neuro/Psych ?PSYCHIATRIC DISORDERS Anxiety Depression  Neuromuscular disease   ? GI/Hepatic ?Neg liver ROS, GERD  Medicated,  ?Endo/Other  ?negative endocrine ROS ? Renal/GU ?negative Renal ROS  ?negative genitourinary ?  ?Musculoskeletal ? ?(+) Fibromyalgia - ? Abdominal ?  ?Peds ? Hematology ? ?(+) Blood dyscrasia, anemia ,   ?Anesthesia Other Findings ? ? Reproductive/Obstetrics ?(+) Pregnancy ?Undesired fertility ?HSV ? ?  ? ? ? ? ? ? ? ? ? ? ? ? ? ?  ?  ? ? ? ? ? ? ? ? ?Anesthesia Physical ?Anesthesia Plan ? ?ASA: 2 ? ?Anesthesia Plan: Epidural  ? ?Post-op Pain Management:   ? ?Induction:  ? ?PONV Risk Score and Plan: Treatment may vary due to age or medical condition ? ?Airway Management Planned: Natural Airway ? ?Additional Equipment:  ? ?Intra-op Plan:  ? ?Post-operative Plan:  ? ?Informed Consent: I have reviewed the patients History and Physical, chart, labs and discussed the procedure including the risks, benefits and alternatives for the proposed anesthesia with the patient or authorized representative who has indicated his/her understanding and acceptance.  ? ? ? ? ? ?Plan Discussed with: Anesthesiologist ? ?Anesthesia Plan Comments:   ? ? ? ? ? ? ?Anesthesia Quick Evaluation ? ?

## 2021-05-09 NOTE — Lactation Note (Signed)
This note was copied from a baby's chart. ?Lactation Consultation Note ? ?Patient Name: Breanna [name] ?Today's Date: 05/09/2021 ?Reason for consult: L&D Initial assessment;Mother's request;Early term 37-38.6wks;Breastfeeding assistance;Other (Comment) (Anemia, Valtrex, Wellbutrin, Famotidine) ?Age:36 hours ?LC assisted with latch with signs of milk transfer. Infant still feeding at the end of the visit. Mom denied any pain with the latch.  ? ?Mom to receive further LC support on the floor.  ?All questions answered at the end of the visit.  ?Maternal Data ?Has patient been taught Hand Expression?: Yes ?Does the patient have breastfeeding experience prior to this delivery?: Yes ?How long did the patient breastfeed?: most children for 2 years, last child for 20 months until she got pregnant with current pregnancy ? ?Feeding ?Mother's Current Feeding Choice: Breast Milk ? ?LATCH Score ?Latch: Repeated attempts needed to sustain latch, nipple held in mouth throughout feeding, stimulation needed to elicit sucking reflex. ? ?Audible Swallowing: Spontaneous and intermittent ? ?Type of Nipple: Everted at rest and after stimulation ? ?Comfort (Breast/Nipple): Soft / non-tender ? ?Hold (Positioning): Assistance needed to correctly position infant at breast and maintain latch. ? ?LATCH Score: 8 ? ? ?Lactation Tools Discussed/Used ?  ? ?Interventions ?Interventions: Breast feeding basics reviewed;Assisted with latch;Skin to skin;Breast massage;Hand express;Breast compression;Adjust position;Support pillows;Position options;Expressed milk;Education;Infant Driven Feeding Algorithm education ? ?Discharge ?  ? ?Consult Status ?Consult Status: Follow-up from L&D ?Date: 05/10/21 ?Follow-up type: In-patient ? ? ? ?Graysin Luczynski  Nicholson-Springer ?05/09/2021, 6:11 PM ? ? ? ?

## 2021-05-09 NOTE — Progress Notes (Addendum)
Pt stable with epidural in place. No abnormal s/s. 7/70/-2 on last SVE by RN. Continue POC as tolerated.  ? ? ?Attestation of CNM Supervision of midwife student: Evaluation and management procedures were performed by the midwife student under my supervision. I was immediately available for direct supervision, assistance and direction throughout this encounter.  I also confirm that I have verified the information documented in the resident?s note, and that I have also personally reperformed the pertinent components of the physical exam and all of the medical decision making activities.  I have also made any necessary editorial changes. ? ? ?Brand Males, CNM ?05/09/2021 4:44 PM ? ? ?

## 2021-05-09 NOTE — Progress Notes (Signed)
Called by RN to evaluate patient. Went to bedside.  ? ?Patient reports feeling "off and weak" in her upper body, nauseous, and lightheadedness. Got reglan just a few minutes ago to help. Vitals stable and at her baseline. Just delivered and had epidural turned off. Minimal EBL, , but with starting hemoglobin 7.8. Reports just feeling exhausted. Hasn't sleep much or ate/drank due to nausea.  ? ?Neuro: Alert and oriented. CN 2-12 intact. EOMI. Follows normal conversation and able to follow complex commands. Moving upper extremity spontaneously and equally. No pronator drift. Initially 4/5 strength on the L but on repeat a few minutes later 5/5 bilaterally. Sensation to light touch intact throughout.  ? ?At the end of evaluation, patient reports she is starting to feel better. Less generally weak.  ? ?Reassuringly neurologically intact, but does appear tired. Plan to monitor for now as she already is feeling better. L&D team had already planned for repeat CBC and IV venofer due to her low hemoglobin. Instructed to let us know if her symptoms persistent or worsen.  ? ?Allayne Stack, DO  ?

## 2021-05-09 NOTE — Progress Notes (Addendum)
LABOR PROGRESS NOTE ? ?Subjective: Patient pleasant with family at bs for support. Reports CTXs that are manageable without pressure. Does not report decreased fetal movement, LOF, or vaginal bleeding. ? ?Objective: ?Blood pressure 123/73, pulse 96, temperature 98.2 ?F (36.8 ?C), temperature source Oral, resp. rate 16, last menstrual period 08/23/2020, unknown if currently breastfeeding. ?Temp (24hrs), Avg:98.3 ?F (36.8 ?C), Min:98.2 ?F (36.8 ?C), Max:98.3 ?F (36.8 ?C) ? ? ?Fetal Heart Tones ?Baseline: 140 ?Variability: moderate ?Accelerations: present, 15x15 ?Decelerations: absent ?Overall assessment: cat 1 ? ?Contractions: irregular ? ?Cervical Exam: 4/70%/-2  ? ?ASSESSMENT AND PLAN:   ?IUP at [redacted]w[redacted]d in early labor ? ?Initiate pitocin 2x2 per order as tolerated by FHTs ?AROM in 2-3 hours PRN ?Epidural PRN ? ?Electronically Signed By: ?Greggory Keen, SNM ?05/09/21 11:56 AM   ? ? ?Attestation of CNM Supervision of midwife student: Evaluation and management procedures were performed by the midwife student under my supervision. I was immediately available for direct supervision, assistance and direction throughout this encounter.  I also confirm that I have verified the information documented in the resident?s note, and that I have also personally reperformed the pertinent components of the physical exam and all of the medical decision making activities.  I have also made any necessary editorial changes. ? ?Patient was offered AROM first. R/B/A were discussed. Patient declines AROM at this time as she desires to start Pitocin first and will proceed with AROM in 2-3 hours.  ? ?Renee Harder, CNM ?05/09/2021 12:30 PM ? ? ?

## 2021-05-09 NOTE — H&P (Addendum)
OBSTETRIC ADMISSION HISTORY AND PHYSICAL ? ?Breanna [name] is a 36 y.o. female [phone] with IUP at [redacted]w[redacted]d by LMP presenting for IOL for FGR (EFW 9%). She reports +FMs, no LOF, no VB, no blurry vision, headaches, peripheral edema, or RUQ pain.  She plans on breast feeding. Regarding birth control, her partner had a vasectomy 2 weeks ago and she is aware that this will not be effective until 3 months after the procedure. ? ?She received her prenatal care at [facility]. ? ?Dating: By LMP --->  Estimated Date of Delivery: 05/30/21 ? ?Sono:   ?@[redacted]w[redacted]d , normal anatomy, cephalic presentation, anterior placental lie, 2156g, 9% EFW ? ?Prenatal History/Complications:  ?FGR (EFW 9%) ?HSV (On Valtrex)  ?AMA ?Hyperemesis gravidarum ?Mixed anxiety depressive disorder ?Fibromyalgia ? ?Past Medical History: ?Past Medical History:  ?Diagnosis Date  ? Anemia   ? Anxiety   ? BV (bacterial vaginosis)   ? Depression   ? Herpes genitalia   ? History of postpartum depression, currently pregnant   ? Hypokalemia   ? PTSD (post-traumatic stress disorder)   ? UTI (lower urinary tract infection)   ? ? ?Past Surgical History: ?Past Surgical History:  ?Procedure Laterality Date  ? APPENDECTOMY  2018  ? MOUTH SURGERY    ? RHINOPLASTY    ? ? ?Obstetrical History: ?OB History   ? ? Gravida  ?7  ? Para  ?4  ? Term  ?4  ? Preterm  ?0  ? AB  ?2  ? Living  ?4  ?  ? ? SAB  ?1  ? IAB  ?1  ? Ectopic  ?0  ? Multiple  ?0  ? Live Births  ?4  ?   ?  ?  ? ? ?Social History ?Social History  ? ?Socioeconomic History  ? Marital status: Married  ?  Spouse name: Not on file  ? Number of children: Not on file  ? Years of education: Not on file  ? Highest education level: Not on file  ?Occupational History  ? Not on file  ?Tobacco Use  ? Smoking status: Former  ?  Types: Cigarettes  ?  Quit date: 02/17/2012  ?  Years since quitting: 9.2  ? Smokeless tobacco: Never  ?Vaping Use  ? Vaping Use: Never used  ?Substance and Sexual Activity  ? Alcohol use: Not Currently  ?   Comment: ocasionally  ? Drug use: No  ? Sexual activity: Yes  ?  Birth control/protection: None  ?Other Topics Concern  ? Not on file  ?Social History Narrative  ? Not on file  ? ?Social Determinants of Health  ? ?Financial Resource Strain: Not on file  ?Food Insecurity: Food Insecurity Present  ? Worried About Charity fundraiser in the Last Year: Sometimes true  ? Ran Out of Food in the Last Year: Sometimes true  ?Transportation Needs: Unmet Transportation Needs  ? Lack of Transportation (Medical): Yes  ? Lack of Transportation (Non-Medical): No  ?Physical Activity: Not on file  ?Stress: Not on file  ?Social Connections: Not on file  ? ? ?Family History: ?Family History  ?Problem Relation Age of Onset  ? Healthy Mother   ? Healthy Father   ? Thyroid disease Maternal Grandmother   ? Bipolar disorder Maternal Grandmother   ? Diabetes Paternal Grandmother   ? Heart disease Paternal Grandmother   ? Hypertension Paternal Grandmother   ? Alcohol abuse Neg Hx   ? Arthritis Neg Hx   ? Asthma Neg  Hx   ? Birth defects Neg Hx   ? Cancer Neg Hx   ? COPD Neg Hx   ? Depression Neg Hx   ? Drug abuse Neg Hx   ? Early death Neg Hx   ? Hearing loss Neg Hx   ? Hyperlipidemia Neg Hx   ? Learning disabilities Neg Hx   ? Kidney disease Neg Hx   ? Mental illness Neg Hx   ? Mental retardation Neg Hx   ? Miscarriages / Stillbirths Neg Hx   ? Stroke Neg Hx   ? Vision loss Neg Hx   ? ? ?Allergies: ?Allergies  ?Allergen Reactions  ? Latex Itching  ?  Vaginal irritation  ? Scopolamine Rash  ? Tape Rash  ? ? ?Medications Prior to Admission  ?Medication Sig Dispense Refill Last Dose  ? buPROPion (WELLBUTRIN XL) 150 MG 24 hr tablet Take 1 tablet (150 mg total) by mouth in the morning and at bedtime. 60 tablet 5 05/08/2021  ? famotidine (PEPCID) 20 MG tablet Take 1 tablet (20 mg total) by mouth 2 (two) times daily. 60 tablet 0 05/08/2021  ? metoCLOPramide (REGLAN) 5 MG tablet Take 1 tablet (5 mg total) by mouth every 8 (eight) hours as needed for  nausea or refractory nausea / vomiting. 60 tablet 0 05/08/2021  ? valACYclovir (VALTREX) 500 MG tablet Take 1 tablet (500 mg total) by mouth 2 (two) times daily. 60 tablet 6 05/08/2021  ? omeprazole (PRILOSEC OTC) 20 MG tablet Take 1 tablet (20 mg total) by mouth daily. 30 tablet 0   ? ondansetron (ZOFRAN) 8 MG tablet Take 1 tablet (8 mg total) by mouth every 8 (eight) hours as needed for nausea or vomiting. 20 tablet 1   ? potassium chloride SA (KLOR-CON M) 20 MEQ tablet Take 1 tablet (20 mEq total) by mouth daily. 7 tablet 0   ? promethazine (PHENERGAN) 25 MG tablet Take 1 tablet (25 mg total) by mouth every 6 (six) hours as needed for nausea or vomiting. 30 tablet 1   ? ? ? ?Review of Systems  ?All systems reviewed and negative except as stated in HPI ? ?Blood pressure 112/69, pulse 90, temperature 98.3 ?F (36.8 ?C), temperature source Oral, resp. rate 16, last menstrual period 08/23/2020, unknown if currently breastfeeding. ? ?General appearance: alert, cooperative, and appears stated age ?Lungs: normal work of breathing on room air ?Heart: normal rate, warm extremities ?Abdomen: soft, non-tender, gravid ?Pelvic: Speculum exam performed by Dr. Mathis FareAlbert, no HSV lesions appreciated  ?Extremities: no lower extremity edema, no calf tenderness ?Presentation: cephalic ([facility]) ?Fetal monitoring: Baseline: 140 bpm, Variability: Good {> 6 bpm), Accelerations: Reactive, and Decelerations: Absent ?Uterine activity: Frequency: Occasional ?Dilation: 1 ?Effacement (%): 50 ?Station: -2 ?Exam by:: MHopkins RN ? ? ?Prenatal labs: ?ABO, Rh: --/--/B POS (03/24 16100616) ?Antibody: NEG (03/24 [id number]) ?Rubella: 13.30 (10/24 1056) ?RPR: Non Reactive (10/24 1056)  ?HBsAg: Negative (10/24 1056)  ?HIV: Non Reactive (10/24 1056)  ?GBS: Negative/-- (03/16 1546)  ?2 hr Glucola - Declined, Hgb A1c WNL ?Genetic screening - Declined ?Anatomy US normal, but f/u growth diagnosed FGR at 29wks ? ?Prenatal Transfer Tool  ?Maternal  Diabetes: No ?Genetic Screening: Declined ?Maternal Ultrasounds/Referrals: IUGR ?Fetal Ultrasounds or other Referrals:  None ?Maternal Substance Abuse:  No ?Significant Maternal Medications:  Meds include: Other: Wellbutrin, Reglan, Valtrex, Prilosec, Famotidine ?Significant Maternal Lab Results: Group B Strep negative ? ?Results for orders placed or performed during the hospital encounter of 05/09/21 (from  the past 24 hour(s))  ?CBC  ? Collection Time: 05/09/21  6:08 AM  ?Result Value Ref Range  ? WBC 6.9 4.0 - 10.5 K/uL  ? RBC 3.74 (L) 3.87 - 5.11 MIL/uL  ? Hemoglobin 7.8 (L) 12.0 - 15.0 g/dL  ? HCT 25.3 (L) 36.0 - 46.0 %  ? MCV 67.6 (L) 80.0 - 100.0 fL  ? MCH 20.9 (L) 26.0 - 34.0 pg  ? MCHC 30.8 30.0 - 36.0 g/dL  ? RDW 17.0 (H) 11.5 - 15.5 %  ? Platelets 282 150 - 400 K/uL  ? nRBC 0.3 (H) 0.0 - 0.2 %  ?Type and screen  ? Collection Time: 05/09/21  6:16 AM  ?Result Value Ref Range  ? ABO/RH(D) PENDING   ? Antibody Screen PENDING   ? Sample Expiration    ?  05/12/2021,2359 ?Performed at [facility], [place] [address] [place], [place] [address] ?  ? ? ?Patient Active Problem List  ? Diagnosis Date Noted  ? IUGR (intrauterine growth restriction) affecting care of mother, third trimester, fetus 1 05/09/2021  ? Nausea and vomiting during pregnancy 04/25/2021  ? History of ELISA positive for HSV 03/24/2021  ? Unwanted fertility 03/24/2021  ? IUGR (intrauterine growth restriction) affecting care of mother 03/20/2021  ? Antepartum multigravida of advanced maternal age 53/07/2021  ? Supervision of high risk pregnancy, antepartum 11/20/2020  ? History of COVID-19 08/23/2019  ? PTSD (post-traumatic stress disorder)   ? Fibromyalgia 04/09/2016  ? Mixed anxiety depressive disorder 09/13/2012  ? ? ?Assessment/Plan:  ?Breanna [name] is a 36 y.o. [id number] at [redacted]w[redacted]d here for IOL for FGR (EFW 9%) ? ?#Labor: Vaginal Cytotec placed at [id number]. Will plan for recheck is ~4 hours and consider FB vs AROM and Pit depending on the check  at that time.  ?#Pain: Medications PRN, planning for likely epidural  ?#FWB: Category I ?#ID:  GBS negative ?#MOF: Breast ?#MOC: Partner Vasectomy ?#Circ:  No, if boy ? ?#Mixed Anxiety/Depression: Monitor PP

## 2021-05-10 ENCOUNTER — Encounter (HOSPITAL_COMMUNITY): Payer: Self-pay | Admitting: Obstetrics and Gynecology

## 2021-05-10 MED ORDER — BUPROPION HCL ER (XL) 150 MG PO TB24
150.0000 mg | ORAL_TABLET | Freq: Two times a day (BID) | ORAL | Status: DC
  Administered 2021-05-10 – 2021-05-11 (×3): 150 mg via ORAL
  Filled 2021-05-10 (×4): qty 1

## 2021-05-10 NOTE — Social Work (Addendum)
CSW received consult for hx of Anxiety, Depression and PPD. CSW met with [name] to offer support and complete assessment.   ? ?CSW met with [name] at bedside and introduced CSW role. CSW observed [name] in bed, nurse completing a hearing test with the infant, and FOB in the bathroom. CSW offered to return. [name] presented pleasant and welcomed CSW visit with the nurse and FOB present. CSW inquired how [name] has felt since giving birth. [name] reported that she is feeling tired with little sleep. [name] reported she will not sleep until she is home. [name] shared the labor and delivery experience was "great, I felt on cloud 9." [name] expressed feeling attached and bonding well with the baby. [name] shared her boys came to visit their little sister and everyone seem super excited about the baby. CSW inquired how [name] felt emotionally during the pregnancy. [name] reported that she felt "disconnected from the baby during the pregnancy." [name] explained that she had "hyperemesis, terrible nausea and low iron." She did not like being pregnant and this was different for her. [name] reported the physical symptoms also affected her emotionally, her ?anxiety was very high and I cried a lot." [name] reported that she started seeing IBH counselor Jamie and trialed different psychotropic medications. [name] reported she is currently taking Wellbutrin which is working well for her. [name] reported she plans to talk with her provider about increasing the dose to get her through the next few months to avoid PPD. [name] shared that she experienced PPD as evidenced by "things feeling Dark, felt disconnected and sad."  [name] reported she was not taking medication last time so she is hopeful this time the medication will help, and she will continue to see the counselor. CSW inquired about [name] coping strategies. [name] reported that she has not been able to use her coping strategies during the pregnancy, but she looks forward to "thrifting and collecting antiques again." [name] reported her  spouse encourages her "get back into it." CSW encouraged [name] to try and implement things her day to day that make her happy. [name] identified her spouse and parents as supports.  ? ?CSW provided education regarding the baby blues period vs. perinatal mood disorders, discussed treatment and gave resources for mental health follow up if concerns arise.  CSW recommended [name] complete a self-evaluation during the postpartum time period using the New Mom Checklist from Postpartum Progress and encouraged [name] to contact a medical professional if symptoms are noted at any time.  [name] reported she feels comfortable reaching out to her provider for support. CSW assessed [name] for safety. [name] denied thoughts of harm to self and others.  ? ?CSW provided review of Sudden Infant Death Syndrome (SIDS) precautions.  [name] has chosen [facility] for the infant's follow up care. CSW inquired if [name] receives WIC/FS. [name] reported that she will need to apply for both. CSW provided [name] and FOB with the contact information and location to follow up for the EBT. CSW assessed [name] for additional need. [name] reported no further need.   ? ?CSW identifies no further need for intervention and no barriers to discharge at this time. ? ?Dawana Asper, MSW, LCSW ?[facility]  ?Clinical Social Worker  ?[phone] ?05/10/2021  11:30 AM  ?

## 2021-05-10 NOTE — Progress Notes (Signed)
Patient ID: Breanna [name], female   DOB: 1985/02/23, 36 y.o.   MRN: [id number] ?Post Partum Day 1 ?Subjective: ?Pt has no complaints, up ad lib, voiding, and tolerating PO. Pt has not had flatus yet.. Pt states bleeding is light, she is in no pain, and she has not gotten any sleep overnight leading to fatigue. Plans on breastfeeding and has no concerns. She was anemic on arrival, has the fatigue and a mild headache. Pt attributes these sx to lack of sleep and missing her dose of Wellbutrin last night. Pt requested for her twice daily home med Wellbutrin XL 150 mg to be ordered so she can resume that this AM. Discussed pt needing IV iron for her anemia today. Pt would prefer other options/recommendations that are not PO iron for further supplementation. She has had bad constipation and stomach cramps with PO iron in the past and would like to avoid it if at all possible. ? ?Objective: ?Blood pressure (!) 102/58, pulse 82, temperature 98.8 ?F (37.1 ?C), temperature source Oral, resp. rate 17, last menstrual period 08/23/2020, SpO2 100 %, unknown if currently breastfeeding. ? ?Physical Exam:  ?General: alert, cooperative, fatigued, and no distress ?Lochia: appropriate ?Uterine Fundus: firm ?DVT Evaluation: No evidence of DVT seen on physical exam. ? ?Recent Labs  ?  05/09/21 ?0608  ?HGB 7.8*  ?HCT 25.3*  ? ? ?Assessment/Plan: ?Plan for discharge tomorrow. Pt would like to go home as early as possible. Consider discharge today if baby is able to go home. ? ?#Anemia: IV iron infusion. Potential for additional PO iron supplementation.  ? ?#MOC: partner got vasectomy 2 weeks ago. 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 Pt does not request any MOC in the interim.   ? ?#MOF: Pt breastfeeding. No concerns. ? ? LOS: 1 day  ? ?Breanna [name] ?05/10/2021, 6:16 AM  ? ? ?

## 2021-05-10 NOTE — Anesthesia Postprocedure Evaluation (Signed)
Anesthesia Post Note ? ?Patient: Breanna [name] ? ?Procedure(s) Performed: AN AD HOC LABOR EPIDURAL ? ?  ? ?Patient location during evaluation: Mother Baby ?Anesthesia Type: Epidural ?Level of consciousness: awake and alert ?Pain management: pain level controlled ?Vital Signs Assessment: post-procedure vital signs reviewed and stable ?Respiratory status: spontaneous breathing, nonlabored ventilation and respiratory function stable ?Cardiovascular status: stable ?Postop Assessment: no headache, no backache and epidural receding ?Anesthetic complications: no ? ? ?No notable events documented. ? ?Last Vitals:  ?Vitals:  ? 05/10/21 0130 05/10/21 0537  ?BP: (!) 99/58 (!) 102/58  ?Pulse:    ?Resp: 17 17  ?Temp: 37 ?C 37.1 ?C  ?SpO2: 100% 100%  ?  ?Last Pain:  ?Vitals:  ? 05/10/21 0537  ?TempSrc: Oral  ?PainSc: 0-No pain  ? ?Pain Goal:   ? ?  ?  ?  ?  ?  ?  ?  ? ?Breanna [name] ? ? ? ? ?

## 2021-05-11 MED ORDER — IBUPROFEN 600 MG PO TABS: 600.0000 mg | ORAL_TABLET | Freq: Four times a day (QID) | ORAL | 0 refills | Status: AC | PRN

## 2021-05-11 MED ORDER — FERROUS SULFATE 325 (65 FE) MG PO TABS: 325.0000 mg | ORAL_TABLET | ORAL | 0 refills | Status: AC

## 2021-05-11 MED ORDER — ACETAMINOPHEN 500 MG PO TABS: 1000.0000 mg | ORAL_TABLET | Freq: Three times a day (TID) | ORAL | 0 refills | Status: AC | PRN

## 2021-05-11 NOTE — Lactation Note (Signed)
This note was copied from a baby's chart. ?Lactation Consultation Note ? ?Patient Name: Breanna [name] ?Today's Date: 05/11/2021 ?Reason for consult: Follow-up assessment;Mother's request;Early term 37-38.6wks;Infant < 6lbs;Infant weight loss;Breastfeeding assistance ?Age:36 hours ? ?LC reviewed LPTI guidelines given infant 37 weeks and under 6lbs. Infant 7 % weight loss with adequate urine and stool output.  ? ?Plan 1.To feed based on cues 8-12x 24hr period. Mom to offer more volume with breast compression noting signs of milk transfer. ?2. Mom to supplement with EBM 10-20 ml per feeding with pace bottle feeding and yellow slow flow nipple.  ?3. DEBP q 3hrs for 15 min  ? ?All questions answered at the end of the visit.  ?Mom denied any pain with latching or use of 24 flange. Coconut oil provided to use before pumping.  ? ?Maternal Data ?Has patient been taught Hand Expression?: Yes ? ?Feeding ?Mother's Current Feeding Choice: Breast Milk ? ?LATCH Score ?  ? ?  ? ?  ? ?  ? ?  ? ?  ? ? ?Lactation Tools Discussed/Used ?Tools: Pump;Flanges;Coconut oil ?Flange Size: 24 ?Breast pump type: Double-Electric Breast Pump ?Pump Education: Setup, frequency, and cleaning;Milk Storage ?Reason for Pumping: increase stimulation ?Pumping frequency: every 3 hrs for 15 min ? ?Interventions ?Interventions: Breast feeding basics reviewed;Breast compression;Expressed milk;DEBP;Education;Pace feeding;Infant Driven Feeding Algorithm education;LPT handout/interventions;LC Services brochure ? ?Discharge ?Discharge Education: Engorgement and breast care;Warning signs for feeding baby;Outpatient recommendation;Outpatient Epic message sent ?Pump: Personal;Manual ? ?Consult Status ?Consult Status: Complete ?Date: 05/12/21 ?Follow-up type: In-patient ? ? ? ?Breanna Lira  [name] ?05/11/2021, 10:34 AM ? ? ? ?

## 2021-05-11 NOTE — Social Work (Signed)
CSW acknowledges consult for [name] scoring 19 on [place].  ? ?An assessment has been completed with [name] and [name] has been provided with necessary resources and education on PPD. [name] will also be referred to IBH for close follow-up.  ?[name] currently prescribed Wellbutrin and seeing IBH counselor. ? ?Marc Sivertsen, LCSW ?Clinical Social Work ?[facility] ?[phone]  ?

## 2021-05-12 ENCOUNTER — Encounter: Payer: Self-pay | Admitting: Advanced Practice Midwife

## 2021-05-12 ENCOUNTER — Telehealth (HOSPITAL_COMMUNITY): Payer: Self-pay | Admitting: *Deleted

## 2021-05-12 DIAGNOSIS — Z1331 Encounter for screening for depression: Secondary | ICD-10-CM

## 2021-05-12 MED ORDER — LIDOCAINE HCL (PF) 1 % IJ SOLN
INTRAMUSCULAR | Status: DC | PRN
  Administered 2021-05-09 (×2): 5 mL via EPIDURAL

## 2021-05-12 MED ORDER — FENTANYL-BUPIVACAINE-NACL 0.5-0.125-0.9 MG/250ML-% EP SOLN
EPIDURAL | Status: DC | PRN
  Administered 2021-05-09: 12 mL/h via EPIDURAL

## 2021-05-12 NOTE — Telephone Encounter (Signed)
Patient's hospital EPDS = 19 per SW consult. Making referral for Ambulatory IBH with Dr. Crissie Reese notified via chart. ? ?Duffy Rhody, RN 05-12-2021 at 10:45am ?

## 2021-05-13 LAB — BPAM RBC
Blood Product Expiration Date: 202304162359
Blood Product Expiration Date: 202304162359
ISSUE DATE / TIME: 202303201250
Unit Type and Rh: 7300
Unit Type and Rh: 7300

## 2021-05-13 LAB — TYPE AND SCREEN
ABO/RH(D): B POS
Antibody Screen: NEGATIVE
Unit division: 0
Unit division: 0

## 2021-05-13 NOTE — BH Specialist Note (Signed)
Pt did not arrive to video visit and did not answer the phone; Left HIPPA-compliant message to call back Chamar Broughton from [facility] at  [phone] (Bluma Buresh's office).  ?; left MyChart message for patient.  ? ?

## 2021-05-15 ENCOUNTER — Encounter: Payer: Self-pay | Admitting: Family Medicine

## 2021-05-22 ENCOUNTER — Encounter: Payer: Self-pay | Admitting: Obstetrics & Gynecology

## 2021-05-26 ENCOUNTER — Ambulatory Visit: Payer: Medicaid Other | Admitting: Clinical

## 2021-05-26 DIAGNOSIS — Z91199 Patient's noncompliance with other medical treatment and regimen due to unspecified reason: Secondary | ICD-10-CM

## 2021-06-16 ENCOUNTER — Ambulatory Visit (INDEPENDENT_AMBULATORY_CARE_PROVIDER_SITE_OTHER): Payer: Medicaid Other

## 2021-06-16 VITALS — BP 124/85 | HR 72 | Ht 64.0 in | Wt 146.1 lb

## 2021-06-16 DIAGNOSIS — O99345 Other mental disorders complicating the puerperium: Secondary | ICD-10-CM | POA: Diagnosis not present

## 2021-06-16 DIAGNOSIS — F418 Other specified anxiety disorders: Secondary | ICD-10-CM | POA: Diagnosis not present

## 2021-06-16 MED ORDER — BUPROPION HCL ER (XL) 300 MG PO TB24: 300.0000 mg | ORAL_TABLET | Freq: Every day | ORAL | 3 refills | Status: DC

## 2021-06-16 NOTE — BH Specialist Note (Addendum)
Integrated Behavioral Health via Telemedicine Visit ? ?06/20/2021 ?Breanna [name] ?[id number] ? ?Number of Integrated Behavioral Health Clinician visits: 5-Fifth Visit ? ?Session Start time: [id number] ?  ?Session End time: 0903 ? ?Total time in minutes: 16 ? ? ?Referring Provider: Camelia Eng, CNM ?Patient/Family location: Home ?[facility] Provider location: [facility] [employer] [facility] ? ?All persons participating in visit: Patient Breanna [name] and [facility] Breanna [name]  ? ?Types of Service: Individual psychotherapy and Telephone visit ? ?I connected with Breanna [name] and/or Breanna [name]'s  n/a  via  Telephone or Video Enabled Telemedicine Application  (Video is Caregility application) and verified that I am speaking with the correct person using two identifiers. Discussed confidentiality: y ? ?I discussed the limitations of telemedicine and the availability of in person appointments.  Discussed there is a possibility of technology failure and discussed alternative modes of communication if that failure occurs. ? ?I discussed that engaging in this telemedicine visit, they consent to the provision of behavioral healthcare and the services will be billed under their insurance. ? ?Patient and/or legal guardian expressed understanding and consented to Telemedicine visit: Yes  ? ?Presenting Concerns: ?Patient and/or family reports the following symptoms/concerns: Anger, paranoia, feeling overwhelmed; ongoing depression and anxiety; pt is taking Wellbutrin as prescribed; plans to establish care with psychiatry for ongoing [facility] medication management. ?Duration of problem: ongoing; Severity of problem: severe ? ?Patient and/or Family's Strengths/Protective Factors: ?Social connections, Concrete supports in place (healthy food, safe environments, etc.), Sense of purpose, and Physical Health (exercise, healthy diet, medication compliance, etc.) ? ?Goals Addressed: ?Patient will: ?  Reduce symptoms of: anxiety, depression, and mood instability  ? ?Progress towards Goals: ?Ongoing ? ?Interventions: ?Interventions utilized:  Functional Assessment of ADLs and Supportive Reflection ?Standardized Assessments completed: GAD-7 and PHQ 9 ? ?Patient and/or Family Response: Patient agrees with treatment plan. ? ? 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  ? ?Patient may benefit from brief therapeutic intervention today and establish care with psychiatry. ? ?Plan: ?Follow up with behavioral health clinician on : Call Breanna [name] at [phone], as needed. ?Behavioral recommendations:  ?-Continue taking Wellbutrin as prescribed ?-Continue plan to establish care with [facility] (walk-in information on After Visit Summary) ?-Accept referral to [facility] ?Referral(s): Integrated Art gallery manager (In Clinic) and [facility] (LME/Outside Clinic) ? ?I discussed the assessment and treatment plan with the patient and/or parent/guardian. They were provided an opportunity to ask questions and all were answered. They agreed with the plan and demonstrated an understanding of the instructions. ?  ?They were advised to call back or seek an in-person evaluation if the symptoms worsen or if the condition fails to improve as anticipated. ? ?Rae Lips, LCSW ? ? ?  06/20/2021  ?  8:49 AM 04/10/2021  ?  9:27 AM 02/21/2021  ?  9:09 AM 02/12/2021  ? 10:31 AM 01/03/2021  ? 11:03 AM  ?Depression screen PHQ 2/9  ?Decreased Interest 1 2 2 1 3   ?Down, Depressed, Hopeless 3 2 2 1 3   ?PHQ - 2 Score 4 4 4 2 6   ?Altered sleeping 3 3 3 1 3   ?Tired, decreased energy 3 3 3 3 3   ?Change in appetite 0 3 3 3 3   ?Feeling bad or failure about yourself  1 3 2 1 3   ?Trouble concentrating 3 3 3 3 3   ?Moving slowly or fidgety/restless 3 3 3 3 3   ?  Suicidal thoughts 0 0 0  0  ?PHQ-9 Score 17 22 21 16 24   ? ? ?  06/20/2021  ?   8:51 AM 04/10/2021  ?  9:27 AM 02/21/2021  ?  9:09 AM 02/12/2021  ? 10:32 AM  ?GAD 7 : Generalized Anxiety Score  ?Nervous, Anxious, on Edge 3 3 3 3   ?Control/stop worrying 3 3 3 3   ?Worry too much - different things 3 3 3 3   ?Trouble relaxing 3 3 3 3   ?Restless 3 2 3 3   ?Easily annoyed or irritable 3 3 3 3   ?Afraid - awful might happen 3 3 3 3   ?Total GAD 7 Score 21 20 21 21   ? ? ? ? ? ? ?

## 2021-06-16 NOTE — Progress Notes (Signed)
? ? ?Post Partum Visit Note ? ?Breanna [name] is a 36 y.o. [phone] female who presents for a postpartum visit. She is 5 weeks postpartum following a normal spontaneous vaginal delivery.  I have fully reviewed the prenatal and intrapartum course. The delivery was at 37/0 gestational weeks.  Anesthesia: epidural. Postpartum course has been complicated by anxiety. Baby is doing well. Baby is feeding by breast. Bleeding no bleeding. Bowel function is normal. Bladder function is normal. Patient is not sexually active. Contraception method is vasectomy. Postpartum depression screening: positive. ? ?The pregnancy intention screening data noted above was reviewed. Potential methods of contraception were discussed. The patient elected to proceed with No data recorded. ? ? Edinburgh Postnatal Depression Scale - 06/16/21 0937   ? ?  ? Edinburgh Postnatal Depression Scale:  In the Past 7 Days  ? I have been able to laugh and see the funny side of things. 0   ? I have looked forward with enjoyment to things. 1   ? I have blamed myself unnecessarily when things went wrong. 0   ? I have been anxious or worried for no good reason. 2   ? I have felt scared or panicky for no good reason. 2   ? Things have been getting on top of me. 2   ? I have been so unhappy that I have had difficulty sleeping. 2   ? I have felt sad or miserable. 2   ? I have been so unhappy that I have been crying. 1   ? The thought of harming myself has occurred to me. 0   ? Edinburgh Postnatal Depression Scale Total 12   ? ?  ?  ? ?  ? ? ?Health Maintenance Due  ?Topic Date Due  ? COVID-19 Vaccine (1) Never done  ? TETANUS/TDAP  Never done  ? ? ?The following portions of the patient's history were reviewed and updated as appropriate: allergies, current medications, past family history, past medical history, past social history, past surgical history, and problem list. ? ?Review of Systems ?Pertinent items are noted in HPI. ? ?Objective:  ?BP 124/85   Pulse  72   Ht 5\' 4"  (1.626 m)   Wt 146 lb 1.6 oz (66.3 kg)   LMP 08/23/2020   Breastfeeding Yes   BMI 25.08 kg/m?   ? ?General:  alert and no distress  ? Breasts:  not indicated  ?Lungs: Effort normal  ?Heart:  Regular rate  ?Abdomen: soft, non-tender; bowel sounds normal; no masses,  no organomegaly   ?Wound N/a  ?GU exam:  not indicated  ?     ?Assessment:  ? ?1. Postpartum anxiety ?- Previously reports feeling sad, but feelings of anxiety have increased. She constantly feels tense, overwhelmed, fearful, and rage. Feels like small things set her off ?- Currently prescribed Wellbutrin 150mg  BID, however often forgets to take, which makes feelings of anxiety worse. When she does take, feels like mood is better ?- No SI ?- Will change Wellbutrin to 300mg  once daily. She has been seeing 10/24/2020 and would like to continue. She is also requesting referral to psych as she has not seen anyone in over 2 years ? ?- Ambulatory referral to Psychiatry ?- buPROPion (WELLBUTRIN XL) 300 MG 24 hr tablet; Take 1 tablet (300 mg total) by mouth daily.  Dispense: 30 tablet; Refill: 3 ? ?2. Postpartum care and examination ? ? ?Plan:  ? ?Essential components of care per ACOG recommendations: ? ?1.  Mood  and well being: Patient with positive depression screening today. See above. Reviewed local resources for support.  ?- Patient tobacco use? No.   ?- hx of drug use? No.   ? ?2. Infant care and feeding:  ?-Patient currently breastmilk feeding? Yes. Reviewed importance of draining breast regularly to support lactation.  ?-Social determinants of health (SDOH) reviewed in EPIC. No concern ? ?3. Sexuality, contraception and birth spacing ?- Patient does not want a pregnancy in the next year.  Desired family size is 5 children.  ?- Reviewed reproductive life planning. Reviewed contraceptive methods based on pt preferences and effectiveness. Significant other has had vasectomy ? ?4. Sleep and fatigue ?-Encouraged family/partner/community support  of 4 hrs of uninterrupted sleep to help with mood and fatigue ? ?5. Physical Recovery  ?- Discussed patients delivery and complications. She describes her labor as good. ?- Patient had a Vaginal, no problems at delivery. Patient had no lacerations. ?- Patient has urinary incontinence? No. ?- Patient is safe to resume physical and sexual activity ? ?6.  Health Maintenance ?- HM due items addressed Yes ?- Last pap smear  ?Diagnosis  ?Date Value Ref Range Status  ?01/03/2019   Final  ? - Negative for intraepithelial lesion or malignancy (NILM)  ? Pap smear not done at today's visit.  ?-Breast Cancer screening indicated? No.  ? ?7. Chronic Disease/Pregnancy Condition follow up: None ? ? ?Brand Males, CNM ?Center for [employer], [facility]  ?

## 2021-06-16 NOTE — Progress Notes (Deleted)
? ? ?  Post Partum Visit Note ? ?Breanna [name] is a 36 y.o. [phone] female who presents for a postpartum visit. She is  5.3  weeks postpartum following a normal spontaneous vaginal delivery.  I have fully reviewed the prenatal and intrapartum course. The delivery was at 37 gestational weeks.  Anesthesia: epidural. Postpartum course has been ***. Baby is doing well***. Baby is feeding by {breastmilk/bottle:69}. Bleeding {vag bleed:12292}. Bowel function is {normal:32111}. Bladder function is {normal:32111}. Patient {is/is not:9024} sexually active. Contraception method is vasectomy. Postpartum depression screening: {gen negative/positive:315881}. ? ? ?The pregnancy intention screening data noted above was reviewed. Potential methods of contraception were discussed. The patient elected to proceed with No data recorded. ? ? ? ?Health Maintenance Due  ?Topic Date Due  ? COVID-19 Vaccine (1) Never done  ? TETANUS/TDAP  Never done  ? ? ?{Common ambulatory SmartLinks:19316} ? ?Review of Systems ?{ros; complete:30496} ? ?Objective:  ?LMP 08/23/2020   ? ?General:  {gen appearance:16600}  ? Breasts:  {desc; normal/abnormal/not indicated:14647}  ?Lungs: {lung exam:16931}  ?Heart:  {heart exam:5510}  ?Abdomen: {abdomen exam:16834}   ?Wound {Wound assessment:11097}  ?GU exam:  {desc; normal/abnormal/not indicated:14647}  ?     ?Assessment:  ? ? There are no diagnoses linked to this encounter. ? ?*** postpartum exam.  ? ?Plan:  ? ?Essential components of care per ACOG recommendations: ? ?1.  Mood and well being: Patient with {gen negative/positive:315881} depression screening today. Reviewed local resources for support.  ?- Patient tobacco use? {tobacco use:25506}  ?- hx of drug use? {yes/no:25505}   ? ?2. Infant care and feeding:  ?-Patient currently breastmilk feeding? {yes/no:25502}  ?-Social determinants of health (SDOH) reviewed in EPIC. No concerns***The following needs were identified*** ? ?3. Sexuality, contraception  and birth spacing ?- Patient {DOES_DOES OQH:47654} want a pregnancy in the next year.  Desired family size is {NUMBER 1-10:22536} children.  ?- Reviewed reproductive life planning. Reviewed contraceptive methods based on pt preferences and effectiveness.  Patient desired {Upstream End Methods:24109} today.   ?- Discussed birth spacing of 18 months ? ?4. Sleep and fatigue ?-Encouraged family/partner/community support of 4 hrs of uninterrupted sleep to help with mood and fatigue ? ?5. Physical Recovery  ?- Discussed patients delivery and complications. She describes her labor as {description:25511} ?- Patient had a {CHL AMB DELIVERY:2131165151}. Patient had a {laceration:25518} laceration. Perineal healing reviewed. Patient expressed understanding ?- Patient has urinary incontinence? {yes/no:25515} ?- Patient {ACTION; IS/IS YTK:35465681} safe to resume physical and sexual activity ? ?6.  Health Maintenance ?- HM due items addressed {Yes or If no, why not?:20788} ?- Last pap smear  ?Diagnosis  ?Date Value Ref Range Status  ?01/03/2019   Final  ? - Negative for intraepithelial lesion or malignancy (NILM)  ? Pap smear {done:10129} at today's visit.  ?-Breast Cancer screening indicated? {indicated:25516} ? ?7. Chronic Disease/Pregnancy Condition follow up: {Follow up:25499} ? ?- PCP follow up ? ?Guy Begin, CMA ?Center for [employer], [facility]  ?

## 2021-06-20 ENCOUNTER — Ambulatory Visit (INDEPENDENT_AMBULATORY_CARE_PROVIDER_SITE_OTHER): Payer: Medicaid Other | Admitting: Clinical

## 2021-06-20 DIAGNOSIS — F332 Major depressive disorder, recurrent severe without psychotic features: Secondary | ICD-10-CM | POA: Diagnosis not present

## 2021-06-20 DIAGNOSIS — F411 Generalized anxiety disorder: Secondary | ICD-10-CM

## 2021-06-20 NOTE — Patient Instructions (Signed)
[facility] [address] [place], [place] [address] [phone] (main office) [phone] (Roseline Ebarb's office)  [facility]  [address], [place], [place] [address] [phone] or [phone] WALK-IN URGENT CARE 24/7 FOR ANYONE [address], , [place]  [phone] Fax: [phone] [email or web address] *Interpreters available *Accepts all insurance and uninsured for Urgent Care needs *Accepts Medicaid and uninsured for outpatient treatment     ONLY FOR [place] Residents  New patient assessment and therapy walk-ins Mondays and Wednesdays 8am-11am First and second Fridays 1pm-5pm  New patient psychiatry and medication management walk-ins:  Mondays, Wednesdays, Thursdays, Fridays 8am -11am NO PSYCHIATRY WALK-INS on TUESDAYS   

## 2021-07-10 ENCOUNTER — Encounter (HOSPITAL_COMMUNITY): Payer: Self-pay

## 2021-07-10 ENCOUNTER — Telehealth (INDEPENDENT_AMBULATORY_CARE_PROVIDER_SITE_OTHER): Payer: Medicaid Other | Admitting: Psychiatry

## 2021-07-10 ENCOUNTER — Encounter (HOSPITAL_COMMUNITY): Payer: Self-pay | Admitting: Psychiatry

## 2021-07-10 DIAGNOSIS — F422 Mixed obsessional thoughts and acts: Secondary | ICD-10-CM

## 2021-07-10 DIAGNOSIS — F411 Generalized anxiety disorder: Secondary | ICD-10-CM

## 2021-07-10 DIAGNOSIS — F53 Postpartum depression: Secondary | ICD-10-CM

## 2021-07-10 DIAGNOSIS — F331 Major depressive disorder, recurrent, moderate: Secondary | ICD-10-CM

## 2021-07-10 MED ORDER — FLUOXETINE HCL 10 MG PO CAPS: 10.0000 mg | ORAL_CAPSULE | Freq: Every day | ORAL | 0 refills | Status: DC
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
## 2021-08-12 ENCOUNTER — Ambulatory Visit (HOSPITAL_COMMUNITY): Payer: Medicaid Other | Admitting: Psychiatry

## 2021-08-27 ENCOUNTER — Ambulatory Visit (INDEPENDENT_AMBULATORY_CARE_PROVIDER_SITE_OTHER): Payer: Medicaid Other | Admitting: Licensed Clinical Social Worker

## 2021-08-27 DIAGNOSIS — F53 Postpartum depression: Secondary | ICD-10-CM

## 2021-08-27 DIAGNOSIS — F411 Generalized anxiety disorder: Secondary | ICD-10-CM | POA: Diagnosis not present

## 2021-08-27 DIAGNOSIS — F422 Mixed obsessional thoughts and acts: Secondary | ICD-10-CM

## 2021-08-27 DIAGNOSIS — F331 Major depressive disorder, recurrent, moderate: Secondary | ICD-10-CM
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
## 2021-08-29 ENCOUNTER — Encounter (HOSPITAL_COMMUNITY): Payer: Self-pay | Admitting: Psychiatry

## 2021-08-29 ENCOUNTER — Telehealth (INDEPENDENT_AMBULATORY_CARE_PROVIDER_SITE_OTHER): Payer: Medicaid Other | Admitting: Psychiatry

## 2021-08-29 DIAGNOSIS — F411 Generalized anxiety disorder: Secondary | ICD-10-CM

## 2021-08-29 DIAGNOSIS — F422 Mixed obsessional thoughts and acts: Secondary | ICD-10-CM | POA: Diagnosis not present

## 2021-08-29 DIAGNOSIS — F53 Postpartum depression: Secondary | ICD-10-CM | POA: Diagnosis not present

## 2021-08-29 DIAGNOSIS — F331 Major depressive disorder, recurrent, moderate: Secondary | ICD-10-CM

## 2021-08-29 MED ORDER — BUSPIRONE HCL 7.5 MG PO TABS: 7.5000 mg | ORAL_TABLET | Freq: Two times a day (BID) | ORAL | 0 refills | Status: DC
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
## 2021-09-02 ENCOUNTER — Encounter (HOSPITAL_COMMUNITY): Payer: Self-pay

## 2021-09-02 ENCOUNTER — Ambulatory Visit (INDEPENDENT_AMBULATORY_CARE_PROVIDER_SITE_OTHER): Payer: Medicaid Other | Admitting: Licensed Clinical Social Worker

## 2021-09-02 DIAGNOSIS — F422 Mixed obsessional thoughts and acts: Secondary | ICD-10-CM

## 2021-09-02 DIAGNOSIS — F331 Major depressive disorder, recurrent, moderate: Secondary | ICD-10-CM

## 2021-09-02 DIAGNOSIS — F411 Generalized anxiety disorder: Secondary | ICD-10-CM

## 2021-09-02 DIAGNOSIS — F53 Postpartum depression: Secondary | ICD-10-CM

## 2021-09-02 NOTE — Progress Notes (Signed)
Virtual Visit via Video Note  I connected with Breanna [name] on 09/02/21 at  8:00 AM EDT by a video enabled telemedicine application and verified that I am speaking with the correct person using two identifiers.  Location: Patient: home Provider: office   I discussed the limitations of evaluation and management by telemedicine and the availability of in person appointments. The patient expressed understanding and agreed to proceed.   I discussed the assessment and treatment plan with the patient. The patient was provided an opportunity to ask questions and all were answered. The patient agreed with the plan and demonstrated an understanding of the instructions.   The patient was advised to call back or seek an in-person evaluation if the symptoms worsen or if the condition fails to improve as anticipated.  I provided 46 minutes of non-face-to-face time during this encounter. THERAPIST PROGRESS NOTE  Session Time: 8:06 AM to 8:50 AM  Participation Level: Active  Behavioral Response: CasualAlertAnxious  Type of Therapy: Individual Therapy  Treatment Goals addressed: Anxiety, anxiety leaving the house, OCD symptoms, anger, depression, self-esteem  ProgressTowards Goals: Initial  Interventions: CBT, Motivational Interviewing, Strength-based, Supportive, and Other: Coping  Summary: Breanna [name] is a 36 y.o. female who presents with therapist encouraging patient to share about herself and therapist sharing a little about herself.  Patient has 5 kids and therapist wanted to know more of her history and patient says she was desperate to be at home to not miss moments with the kids. It has been blessing find most of the day though at the end of the day don't want to talked to, touched and looked at. Medicine is helping her. Husband gets home 10-10:30 at night and he wants to tell her about his day so dumped on that and then he is out at 5. The medicine is helping her to be  present  not feel pressure to engage and be involved and just supportive and that is good enough. He is in general a positive person and she is not.  Explored how they met he was training her children basketball 2 big boys working out with him.  Know he is a good guy.  Talked more about her kids and why all other names besides halo starts with an M.  Talked about self-care for patient and she relates hard to come by [name] 4 months breast fed hard to be away from her at any moment. Paranoia who and where leave kids. Rare to stay with anyone else besides her parents and that is not often. Love the thrifting can't get back to it. [name] doesn't like car seat.  Will have to wait till she is older. Talked about why named [name] she was a simple gift discussed the difficulty of pregnancy and she is doing fine.  Therapist noted her thoughtfulness as it is her husband's birthday today and her creating a [place] with meaningful sayings from different people. Patient said it took a lot of intention thought getting people together and putting it together exhausted even though small banner a log of moving parts making people see is as important as she saw it.  Discussed things that have not worked for patient intention to decrease and control it makes it worse things like mindfulness, yoga, journaling doesn't work. Not time when no mind racing anytime to be still listen to thoughts makes anxiety worse.  Therapist provided psychoeducation on anxiety and patient response was describing anxiety felt like been in her brain for  the last few months. A bit of information overload.  Therapist related will review and look for strategies that will be helpful.  Completed treatment plan and patient gave consent to complete virtually.  Completing treatment plan was helpful to have more complete understanding of patient's anxiety that she has avoidance does not like leaving the house and wants to be able to leave the house ways it bothers her to be out  with crowds worries about germs noise some being on alert.  Therapist assesses some will involve exposing herself slowly to these things and resisting impulses to escape for management of OCD.  Noted worries tied up with going places and grounds.  Started psychoeducation with patient on anxiety that it is Musician but misperception of dangerousness that healthy relationship with anxiety is its a false alarm not to be trusted but tested.  Note thoughts that feed the anxiety include something bad is going to happen, leading to comfortable feelings created by fighter flight that lead to avoidance.  Therapist noted working on not letting the alarm be set off by knowing it is often false alarm other things to working on less avoidance so she can be in the situation know she can cope and anxiety goes away.  Recognizing some of the thoughts that feed the anxiety to be able to challenge them as they are unhelpful and accurate thoughts the cause distress such as something bad is can happen I will be able to cope.  Therapist the session was also spent in building therapeutic rapport getting to know patient and her family a little better to be able to better understand areas where we need to intervene.  Therapist provided space and support for patient to talk about thoughts and feelings in session.  Suicidal/Homicidal: No  Plan: Return again in 1 week.2.  Continue to look at CBT for anxiety, automatic thoughts, OCD information  self-help, kid's book), work on self-esteem and anger  Diagnosis: Generalized anxiety disorder, mixed obsessional thoughts and acts, postpartum depression, major depressive disorder, recurrent, moderate  Collaboration of Care: Medication Management AEB review of Dr. De Nurse note  Patient/Guardian was advised Release of Information must be obtained prior to any record release in order to collaborate their care with an outside provider. Patient/Guardian was advised if they have not already  done so to contact the registration department to sign all necessary forms in order for [place] to release information regarding their care.   Consent: Patient/Guardian gives verbal consent for treatment and assignment of benefits for services provided during this visit. Patient/Guardian expressed understanding and agreed to proceed.   Cordella Register, LCSW 09/02/2021

## 2021-09-02 NOTE — Plan of Care (Signed)
  Problem: Anxiety Disorder CCP Problem  1 manage and decrease symptoms Goal:  anger management skills Outcome: Not Progressing Goal: strengthen self-esteem and decrease depressive symtpoms Outcome: Not Progressing Goal: LTG: Patient will score less than 5 on the Generalized Anxiety Disorder 7 Scale (GAD-7) Outcome: Not Progressing Goal: STG: Patient will practice problem solving skills 3 times per week for the next 4 weeks Outcome: Not Progressing Patient and therapist participated in creating treatment plan

## 2021-09-15 ENCOUNTER — Ambulatory Visit (HOSPITAL_COMMUNITY): Payer: Medicaid Other | Admitting: Licensed Clinical Social Worker

## 2021-09-25 ENCOUNTER — Ambulatory Visit (INDEPENDENT_AMBULATORY_CARE_PROVIDER_SITE_OTHER): Payer: Medicaid Other | Admitting: Licensed Clinical Social Worker

## 2021-09-25 DIAGNOSIS — F422 Mixed obsessional thoughts and acts: Secondary | ICD-10-CM | POA: Diagnosis not present

## 2021-09-25 DIAGNOSIS — F331 Major depressive disorder, recurrent, moderate: Secondary | ICD-10-CM

## 2021-09-25 DIAGNOSIS — F411 Generalized anxiety disorder: Secondary | ICD-10-CM | POA: Diagnosis not present

## 2021-09-25 DIAGNOSIS — F53 Postpartum depression: Secondary | ICD-10-CM

## 2021-09-25 NOTE — Progress Notes (Signed)
Virtual Visit via Video Note  I connected with [name] on 09/25/21 at 10:00 AM EDT by a video enabled telemedicine application and verified that I am speaking with the correct person using two identifiers.  Location: Patient: home Provider: home office   I discussed the limitations of evaluation and management by telemedicine and the availability of in person appointments. The patient expressed understanding and agreed to proceed.  I discussed the assessment and treatment plan with the patient. The patient was provided an opportunity to ask questions and all were answered. The patient agreed with the plan and demonstrated an understanding of the instructions.   The patient was advised to call back or seek an in-person evaluation if the symptoms worsen or if the condition fails to improve as anticipated.  I provided 43 minutes of non-face-to-face time during this encounter.  THERAPIST PROGRESS NOTE  Session Time: 10:00 AM to 10:43 AM  Participation Level: Active  Behavioral Response: CasualAlertDepressed and subdued  Type of Therapy: Individual Therapy  Treatment Goals addressed: Anxiety, anxiety leaving the house, OCD symptoms, anger, depression, self-esteem  ProgressTowards Goals: Progressing-therapist utilizing CBT to identify ways can work on mental health symptoms including choices, mental frames increasing activities that will help with creasing positive chemicals and brain  Interventions: CBT, Solution Focused, Strength-based, Supportive, Reframing, and Other: Coping  Summary: Breanna [name] is a 36 y.o. female who presents with about the same. Previously well treated with depression now more depression maybe unexplained sadness and disconnection and numbness.  Therapist noted day-to-day life can be conducive to feeding depression taking care of kids and being isolated.  Patient relates prefer to not go out less of two evils.  Therapist noted this may be a choice  but still going to impact mood.  Medication helping not be depressed.  Talking about environment 1 that would be helpful for coping patient does try to stay structure schedule kids do better and patient as well doesn't always work though.  Provided psychoeducation for anxiety and depression.  For size important point of working on changing mental models where we can make a change finding once that we will help with improvement in mood.  Patient willing to identify 3 positive things in her day for next time.  Therapist provided psychoeducation for patient on ruminating thoughts and CBT in general.  Noted with ruminating thoughts the problem are not thoughts were worried about they are the effect of the underlying problem which is anxiety that we have to address.  Noted anxiety can be genetic, environmental but where we have the most impact or the mental models perspectives we can take a neutral event and where we are impacted have mental health this in our perception and what therapist is going to work with patient about.  The other part where we have control is the choices we make. We have to work on mental habits of overthinking realizing they are not helping working on getting out of the cycle.  But first to start with and working on this recognizing the models that feet are anxiety.  Overthinking is not a natural state it is not necessary.  It is destructive behavior we can actively choose to stop if we want to.  Stress is effective life but over thinking is optional.  With practice anyone can retrain the brain to work on their side to see things differently and to resist the corrosion of constant anxiety and stress.  Therapist took this information about how we are  working on challenging and changing mental models to work on depression.  Therapist said the same thing applies with depression we have to look and challenge negative perspective we take on things and recognize these as distortions.  Therapist added  in building skills we also want to engage in activities that increase feel good chemicals will also be helpful for depression.  Therapist introduced 3 good things for patient to practice to increase these chemicals about daily things that happen can be simple such as the rain or drinking couple coffee.  Noted this framework as well is these coping skills helpful to develop and working on anxiety and depression Suicidal/Homicidal: No  Plan: Return again in 1 week.2.  Patient note 3 good things from daily life. 3.  Read page 25 from stop overthinking as well as going into chapter 2 as well as depression handbook, look also at OCD from kids book and automatic thoughts  Diagnosis: Generalized anxiety disorder, mixed obsessional thoughts and acts, postpartum depression, major depressive disorder, recurrent, moderate  Collaboration of Care: Other none needed  Patient/Guardian was advised Release of Information must be obtained prior to any record release in order to collaborate their care with an outside provider. Patient/Guardian was advised if they have not already done so to contact the registration department to sign all necessary forms in order for [place] to release information regarding their care.   Consent: Patient/Guardian gives verbal consent for treatment and assignment of benefits for services provided during this visit. Patient/Guardian expressed understanding and agreed to proceed.   Coolidge Breeze, LCSW 09/25/2021

## 2021-10-01 ENCOUNTER — Ambulatory Visit (HOSPITAL_COMMUNITY): Payer: Medicaid Other | Admitting: Licensed Clinical Social Worker

## 2021-10-01 ENCOUNTER — Encounter (HOSPITAL_COMMUNITY): Payer: Self-pay

## 2021-10-01 DIAGNOSIS — F411 Generalized anxiety disorder: Secondary | ICD-10-CM

## 2021-10-01 DIAGNOSIS — F331 Major depressive disorder, recurrent, moderate: Secondary | ICD-10-CM

## 2021-10-01 DIAGNOSIS — F422 Mixed obsessional thoughts and acts: Secondary | ICD-10-CM

## 2021-10-01 DIAGNOSIS — F53 Postpartum depression: Secondary | ICD-10-CM

## 2021-10-01 NOTE — Progress Notes (Signed)
Therapist contacted patient through My Chart for session and she did not respond. Session is a no show 

## 2021-10-15 ENCOUNTER — Encounter (HOSPITAL_COMMUNITY): Payer: Self-pay | Admitting: Psychiatry

## 2021-10-15 ENCOUNTER — Telehealth (INDEPENDENT_AMBULATORY_CARE_PROVIDER_SITE_OTHER): Payer: Medicaid Other | Admitting: Psychiatry

## 2021-10-15 DIAGNOSIS — F331 Major depressive disorder, recurrent, moderate: Secondary | ICD-10-CM

## 2021-10-15 DIAGNOSIS — F411 Generalized anxiety disorder: Secondary | ICD-10-CM | POA: Diagnosis not present

## 2021-10-15 DIAGNOSIS — F422 Mixed obsessional thoughts and acts: Secondary | ICD-10-CM | POA: Diagnosis not present

## 2021-10-15 DIAGNOSIS — O99345 Other mental disorders complicating the puerperium: Secondary | ICD-10-CM

## 2021-10-15 DIAGNOSIS — F418 Other specified anxiety disorders: Secondary | ICD-10-CM

## 2021-10-15 MED ORDER — BUPROPION HCL ER (XL) 300 MG PO TB24: 300.0000 mg | ORAL_TABLET | Freq: Every day | ORAL | 2 refills | Status: DC
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
## 2021-11-12 ENCOUNTER — Telehealth (HOSPITAL_COMMUNITY): Payer: Medicaid Other | Admitting: Psychiatry

## 2021-11-20 ENCOUNTER — Telehealth (HOSPITAL_COMMUNITY): Payer: Self-pay

## 2021-11-20 NOTE — Telephone Encounter (Signed)
Per patient, she would like to switch from [place] to [place]. I told her we needed permission from both therapist first. She did not give a reason why she wanted to transfer to [facility]. Please advise and one of [place] can give her a call back. Thanks.

## 2021-11-25 NOTE — Telephone Encounter (Signed)
Apt has been made with Josh.  Nothing Further Needed at this time.

## 2021-11-27 ENCOUNTER — Ambulatory Visit (HOSPITAL_COMMUNITY): Payer: Medicaid Other | Admitting: Licensed Clinical Social Worker

## 2021-12-03 ENCOUNTER — Ambulatory Visit (HOSPITAL_COMMUNITY): Payer: Medicaid Other | Admitting: Licensed Clinical Social Worker

## 2021-12-11 ENCOUNTER — Telehealth (INDEPENDENT_AMBULATORY_CARE_PROVIDER_SITE_OTHER): Payer: Medicaid Other | Admitting: Psychiatry

## 2021-12-11 ENCOUNTER — Encounter (HOSPITAL_COMMUNITY): Payer: Self-pay | Admitting: Psychiatry

## 2021-12-11 DIAGNOSIS — F422 Mixed obsessional thoughts and acts: Secondary | ICD-10-CM

## 2021-12-11 DIAGNOSIS — F331 Major depressive disorder, recurrent, moderate: Secondary | ICD-10-CM | POA: Diagnosis not present

## 2021-12-11 DIAGNOSIS — F411 Generalized anxiety disorder: Secondary | ICD-10-CM

## 2021-12-11 DIAGNOSIS — O99345 Other mental disorders complicating the puerperium: Secondary | ICD-10-CM

## 2021-12-11 MED ORDER — BUSPIRONE HCL 7.5 MG PO TABS: 7.5000 mg | ORAL_TABLET | Freq: Two times a day (BID) | ORAL | 1 refills | Status: DC

## 2021-12-11 MED ORDER — BUPROPION HCL ER (XL) 300 MG PO TB24: 300.0000 mg | ORAL_TABLET | Freq: Every day | ORAL | 2 refills | Status: DC
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
## 2022-01-01 ENCOUNTER — Ambulatory Visit (HOSPITAL_COMMUNITY): Payer: Medicaid Other | Admitting: Licensed Clinical Social Worker

## 2022-01-01 ENCOUNTER — Encounter (HOSPITAL_COMMUNITY): Payer: Self-pay

## 2022-01-29 ENCOUNTER — Encounter (HOSPITAL_COMMUNITY): Payer: Self-pay | Admitting: Psychiatry

## 2022-01-29 ENCOUNTER — Telehealth (INDEPENDENT_AMBULATORY_CARE_PROVIDER_SITE_OTHER): Payer: Medicaid Other | Admitting: Psychiatry

## 2022-01-29 DIAGNOSIS — O99345 Other mental disorders complicating the puerperium: Secondary | ICD-10-CM

## 2022-01-29 DIAGNOSIS — F331 Major depressive disorder, recurrent, moderate: Secondary | ICD-10-CM | POA: Diagnosis not present

## 2022-01-29 DIAGNOSIS — F418 Other specified anxiety disorders: Secondary | ICD-10-CM

## 2022-01-29 DIAGNOSIS — F422 Mixed obsessional thoughts and acts: Secondary | ICD-10-CM

## 2022-01-29 MED ORDER — BUSPIRONE HCL 7.5 MG PO TABS: 7.5000 mg | ORAL_TABLET | Freq: Two times a day (BID) | ORAL | 1 refills | Status: DC
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
## 2022-02-23 ENCOUNTER — Ambulatory Visit (HOSPITAL_COMMUNITY): Payer: Medicaid Other | Admitting: Licensed Clinical Social Worker

## 2022-04-27 ENCOUNTER — Telehealth (INDEPENDENT_AMBULATORY_CARE_PROVIDER_SITE_OTHER): Payer: Medicaid Other | Admitting: Psychiatry

## 2022-04-27 ENCOUNTER — Encounter (HOSPITAL_COMMUNITY): Payer: Self-pay | Admitting: Psychiatry

## 2022-04-27 DIAGNOSIS — F422 Mixed obsessional thoughts and acts: Secondary | ICD-10-CM | POA: Diagnosis not present

## 2022-04-27 DIAGNOSIS — F331 Major depressive disorder, recurrent, moderate: Secondary | ICD-10-CM

## 2022-04-27 DIAGNOSIS — O99345 Other mental disorders complicating the puerperium: Secondary | ICD-10-CM | POA: Diagnosis not present

## 2022-04-27 DIAGNOSIS — F411 Generalized anxiety disorder: Secondary | ICD-10-CM | POA: Diagnosis not present

## 2022-04-27 DIAGNOSIS — F418 Other specified anxiety disorders: Secondary | ICD-10-CM

## 2022-04-27 MED ORDER — BUPROPION HCL ER (XL) 300 MG PO TB24: 300.0000 mg | ORAL_TABLET | Freq: Every day | ORAL | 2 refills | Status: DC
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
## 2022-07-29 ENCOUNTER — Telehealth (INDEPENDENT_AMBULATORY_CARE_PROVIDER_SITE_OTHER): Payer: Medicaid Other | Admitting: Psychiatry

## 2022-07-29 ENCOUNTER — Encounter (HOSPITAL_COMMUNITY): Payer: Self-pay | Admitting: Psychiatry

## 2022-07-29 DIAGNOSIS — F422 Mixed obsessional thoughts and acts: Secondary | ICD-10-CM | POA: Diagnosis not present

## 2022-07-29 DIAGNOSIS — F411 Generalized anxiety disorder: Secondary | ICD-10-CM | POA: Diagnosis not present

## 2022-07-29 DIAGNOSIS — F331 Major depressive disorder, recurrent, moderate: Secondary | ICD-10-CM

## 2022-07-29 DIAGNOSIS — F418 Other specified anxiety disorders: Secondary | ICD-10-CM | POA: Diagnosis not present

## 2022-07-29 DIAGNOSIS — O99345 Other mental disorders complicating the puerperium: Secondary | ICD-10-CM

## 2022-07-29 MED ORDER — TRAZODONE HCL 50 MG PO TABS: 50.0000 mg | ORAL_TABLET | Freq: Every day | ORAL | 0 refills | Status: DC

## 2022-07-29 MED ORDER — BUPROPION HCL ER (XL) 300 MG PO TB24: 300.0000 mg | ORAL_TABLET | Freq: Every day | ORAL | 2 refills | Status: DC

## 2022-07-29 MED ORDER — BUSPIRONE HCL 7.5 MG PO TABS: 7.5000 mg | ORAL_TABLET | Freq: Two times a day (BID) | ORAL | 1 refills | Status: DC
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
## 2022-09-02 ENCOUNTER — Telehealth (INDEPENDENT_AMBULATORY_CARE_PROVIDER_SITE_OTHER): Payer: Medicaid Other | Admitting: Psychiatry

## 2022-09-02 ENCOUNTER — Encounter (HOSPITAL_COMMUNITY): Payer: Self-pay | Admitting: Psychiatry

## 2022-09-02 DIAGNOSIS — F331 Major depressive disorder, recurrent, moderate: Secondary | ICD-10-CM | POA: Diagnosis not present

## 2022-09-02 DIAGNOSIS — F422 Mixed obsessional thoughts and acts: Secondary | ICD-10-CM | POA: Diagnosis not present

## 2022-09-02 DIAGNOSIS — F411 Generalized anxiety disorder: Secondary | ICD-10-CM | POA: Diagnosis not present

## 2022-09-02 DIAGNOSIS — F53 Postpartum depression: Secondary | ICD-10-CM
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
## 2022-09-10 IMAGING — US US MFM OB FOLLOW-UP
1 series · 13 of 28 positions shown · non-contrast
Comparison: none

[Series 1: us mfm ob follow-up · 57 acquisitions, 13 frames shown]
[im 3/57]
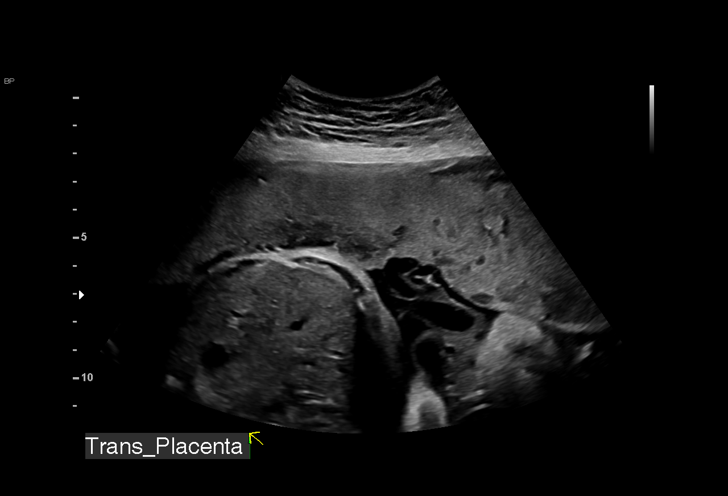
[im 7/57]
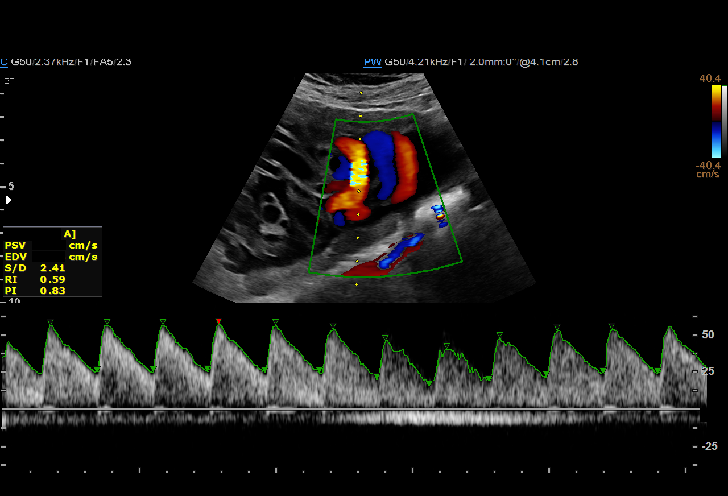
[im 11/57]
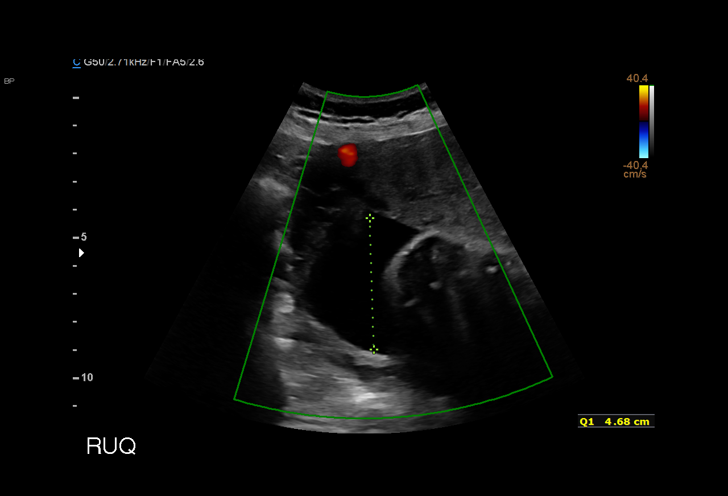
[im 15/57]
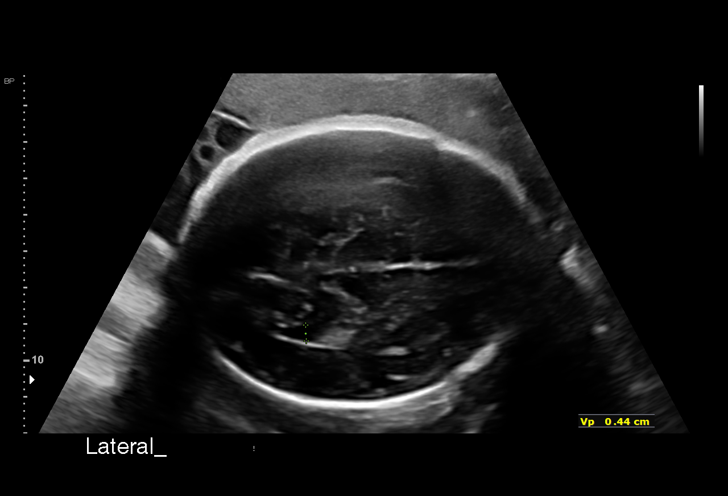
[im 19/57]
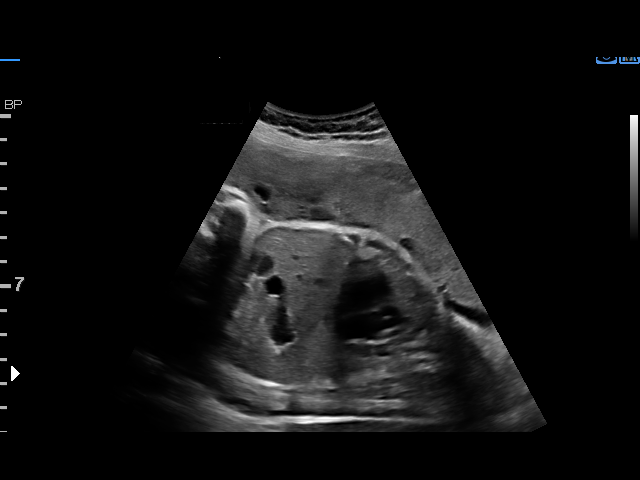
[im 23/57]
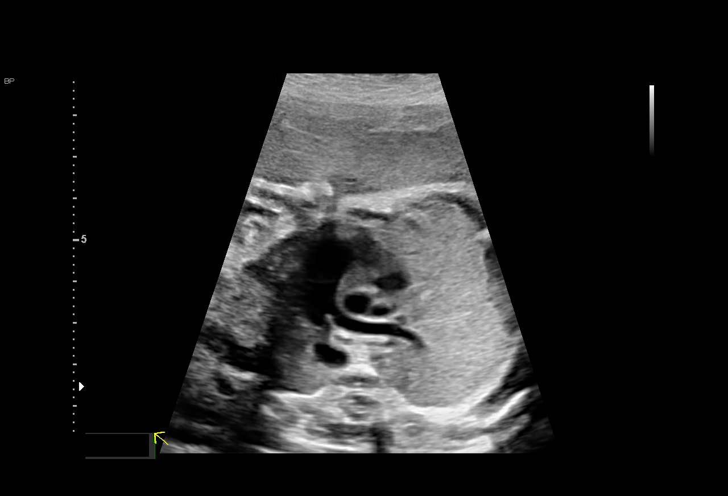
[im 30/57]
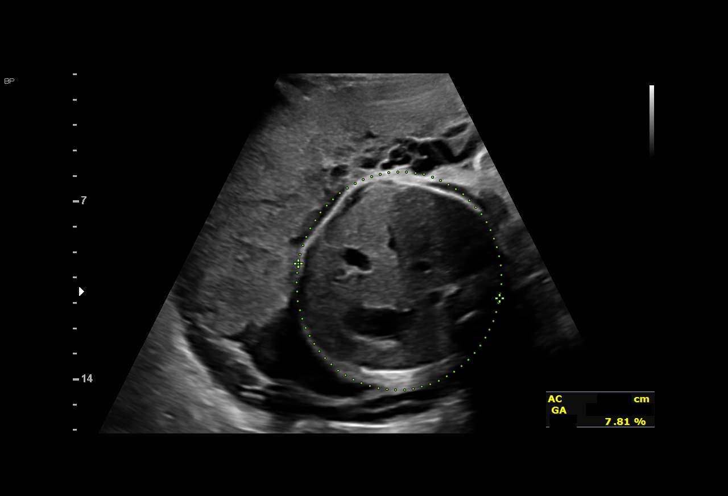
[im 34/57]
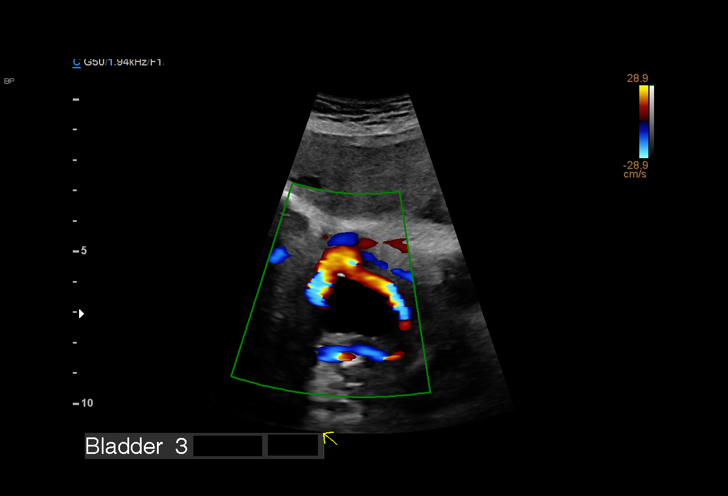
[im 38/57]
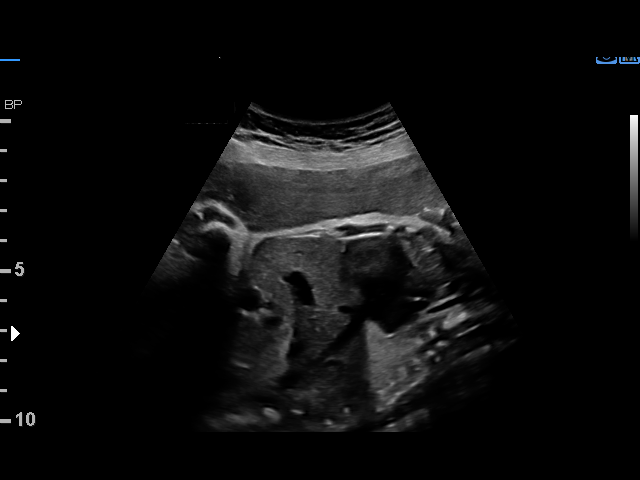
[im 42/57]
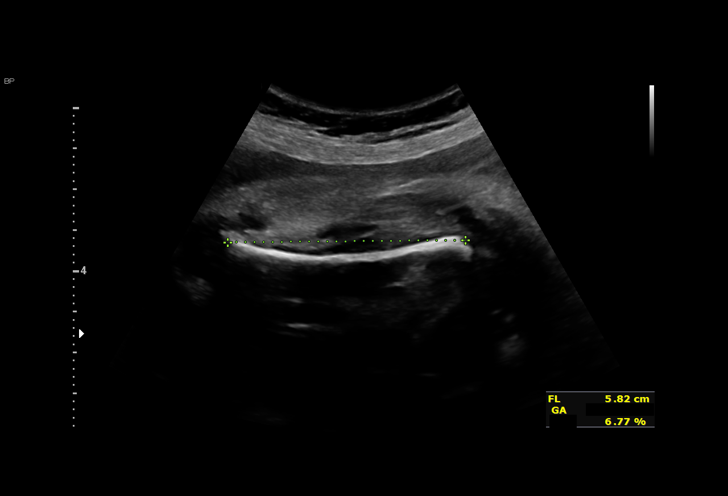
[im 46/57]
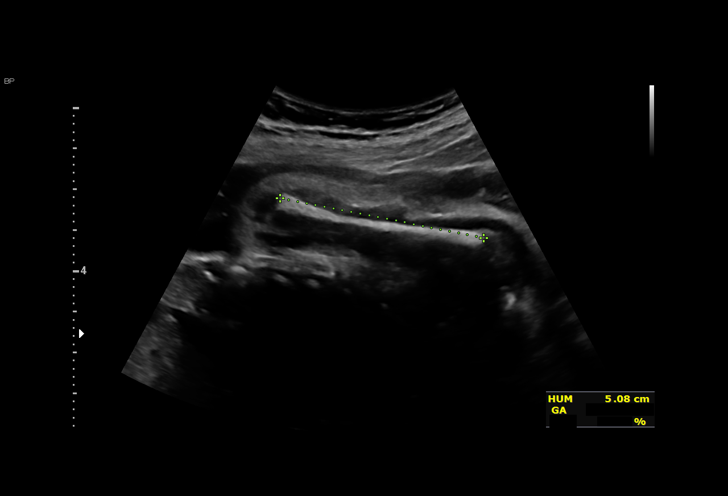
[im 50/57]
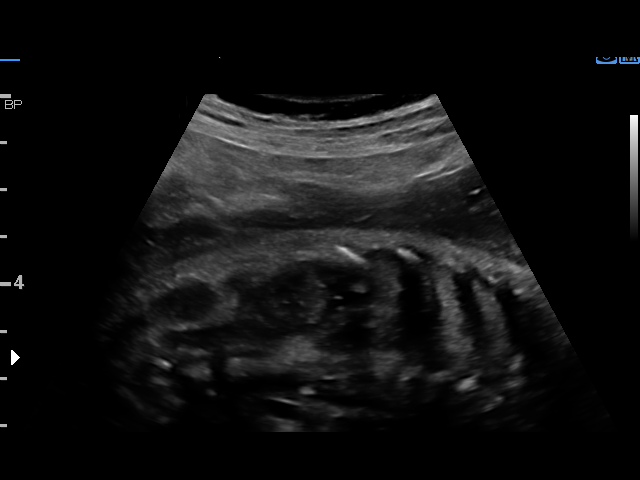
[im 54/57]
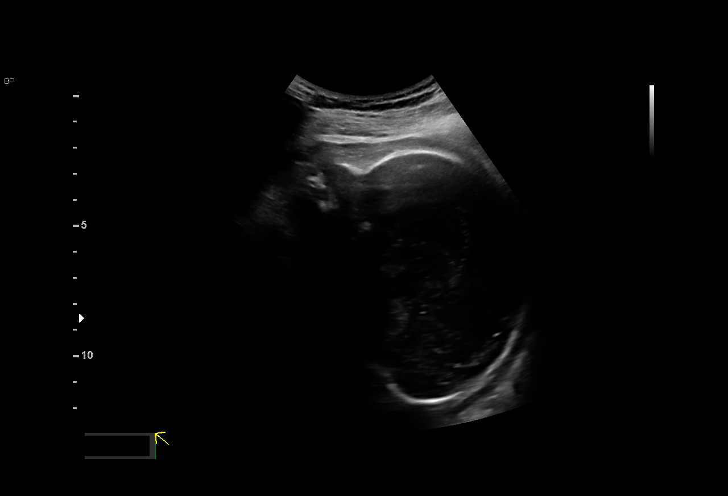

[13 of 28 positions shown; findings below may reference images not displayed]

W/NONSTRESS

Indications

 Maternal care for known or suspected poor
 fetal growth, third trimester, not applicable or
 unspecified IUGR
 Advanced maternal age primigravida 35+,
 third trimester
 Hyperemesis gravidarum
 Herpes simplex virus (KAHLER)
 32 weeks gestation of pregnancy
Fetal Evaluation

 Num Of Fetuses:         1
 Fetal Heart Rate(bpm):  152
 Cardiac Activity:       Observed
 Presentation:           Cephalic
 Placenta:               Anterior
 P. Cord Insertion:      Previously Visualized

 Amniotic Fluid
 AFI FV:      Within normal limits

 AFI Sum(cm)     %Tile       Largest Pocket(cm)
 14.94           52

 RUQ(cm)       RLQ(cm)       LUQ(cm)        LLQ(cm)

Biophysical Evaluation

 Amniotic F.V:   Pocket => 2 cm             F. Tone:        Observed
 F. Movement:    Observed                   N.S.T:          Reactive
 F. Breathing:   Observed                   Score:          [DATE]
Biometry

 BPD:      77.1  mm     G. Age:  31w 0d         14  %    CI:        71.86   %    70 - 86
                                                         FL/HC:      20.0   %    19.1 -
 HC:      289.5  mm     G. Age:  31w 6d         13  %    HC/AC:      1.11        0.96 -
 AC:      259.9  mm     G. Age:  30w 1d          7  %    FL/BPD:     75.1   %    71 - 87
 FL:       57.9  mm     G. Age:  30w 2d          5  %    FL/AC:      22.3   %    20 - 24
 HUM:        50  mm     G. Age:  29w 2d        < 5  %

 LV:        4.4  mm

 Est. FW:    9890  gm      3 lb 7 oz      6  %
OB History

 Gravidity:    7         Term:   4         SAB:   1
 TOP:          1
Gestational Age

 LMP:           32w 0d        Date:  08/23/20                 EDD:   05/30/21
 U/S Today:     30w 6d                                        EDD:   06/07/21
 Best:          32w 0d     Det. By:  LMP  (08/23/20)          EDD:   05/30/21
Anatomy

 Cranium:               Appears normal         LVOT:                   Appears normal
 Cavum:                 Previously seen        Aortic Arch:            Previously seen
 Ventricles:            Appears normal         Ductal Arch:            Appears normal
 Choroid Plexus:        Previously seen        Diaphragm:              Appears normal
 Cerebellum:            Previously seen        Stomach:                Appears normal, left
                                                                       sided
 Posterior Fossa:       Previously seen        Abdomen:                Appears normal
 Nuchal Fold:           Previously seen        Abdominal Wall:         Previously seen
 Face:                  Orbits and profile     Cord Vessels:           Previously seen
                        previously seen
 Lips:                  Previously seen        Kidneys:                Appear normal
 Palate:                Previously seen        Bladder:                Appears normal
 Thoracic:              Previously seen        Spine:                  Previously seen
 Heart:                 Appears normal         Upper Extremities:      Previously seen
                        (4CH, axis, and
                        situs)
 RVOT:                  Appears normal         Lower Extremities:      Previously seen

 Other:  Parents do not wish to know sex of fetus. Heels/feet, open hands/5th
         digits, nasal bone, lenses, VC, 3VV and 3VTV prev visualized.
Doppler - Fetal Vessels

 Umbilical Artery
  S/D     %tile      RI    %tile      PI    %tile            ADFV    RDFV
  2.41       33    0.58       35    0.82       32               No      No
Cervix Uterus Adnexa

 Cervix
 Not visualized (advanced GA >92wks)

 Uterus
 Normal shape and size.

 Right Ovary
 Within normal limits.

 Left Ovary
 Within normal limits.

 Cul De Sac
 No free fluid seen.

 Adnexa
 No adnexal mass visualized.
Comments

 This patient was seen for a follow up growth scan due to fetal
 growth restriction. She denies any problems since her last
 exam and reports feeling vigorous fetal movements
 throughout the day.
 On today's exam, the EFW measures at the 6th percentile for
 her gestational age indicating fetal growth restriction.  The
 fetus has grown over 1 pound over the past 3 weeks.  There
 was normal amniotic fluid noted.
 A biophysical profile performed today due to fetal growth
 restriction was [DATE] with a reactive NST.
 Doppler studies of the umbilical arteries showed a normal
 S/D ratio of 2.41.  There were no signs of absent or reversed
 end-diastolic flow.
 Due to fetal growth restriction, we will continue to follow her
 with weekly fetal testing and umbilical artery Doppler studies.
 Another BPP and umbilical artery Doppler study was
 scheduled in 1 week.

## 2022-09-17 IMAGING — US US MFM UA CORD DOPPLER
1 series · 12 of 28 positions shown · non-contrast
Comparison: none

[Series 1: us mfm ua cord doppler · 41 acquisitions, 12 frames shown]
[im 2/41]
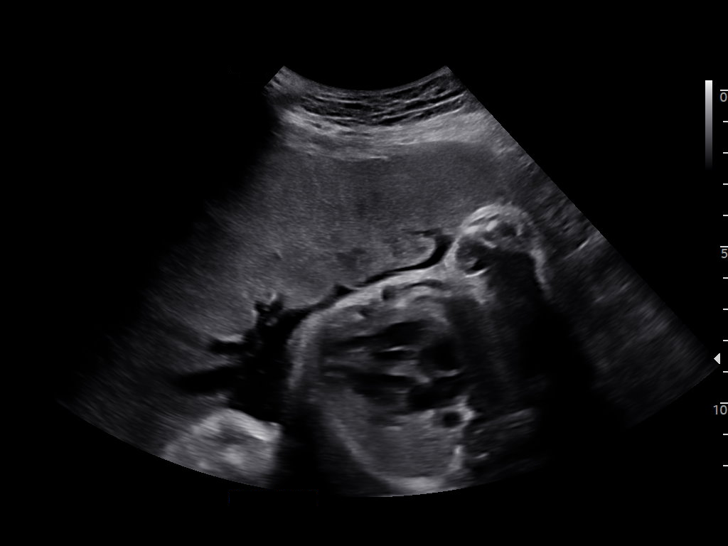
[im 5/41]
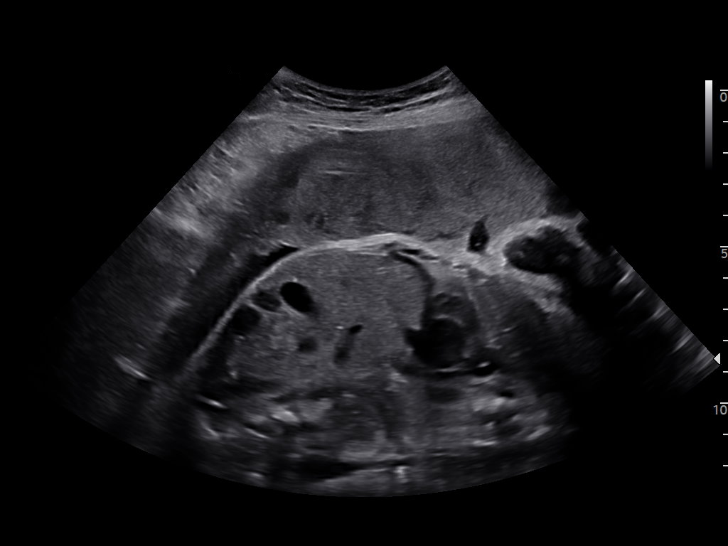
[im 8/41]
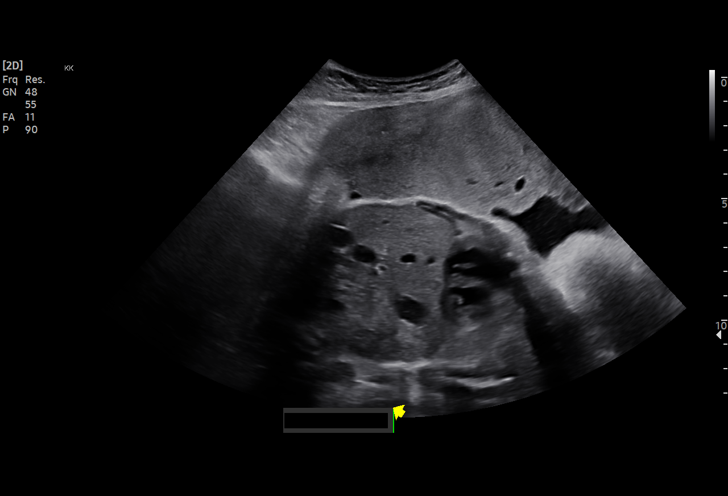
[im 12/41]
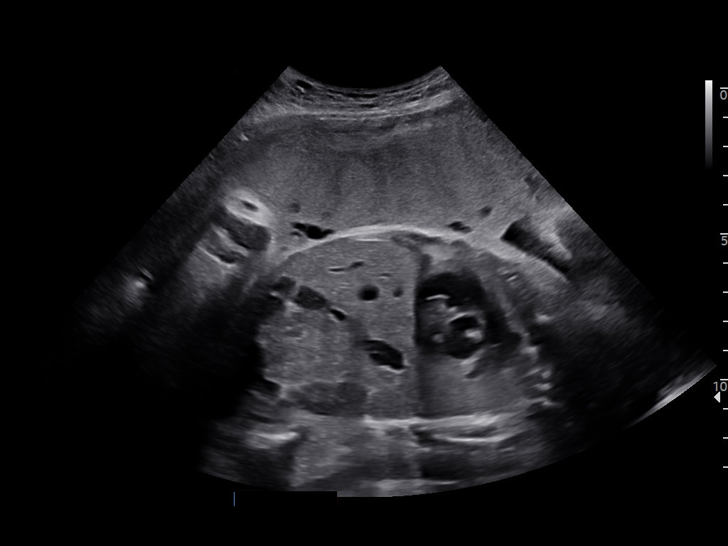
[im 15/41]
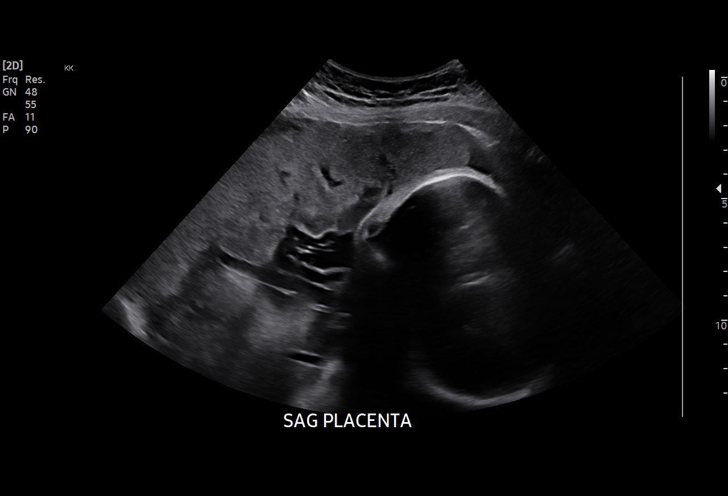
[im 18/41]
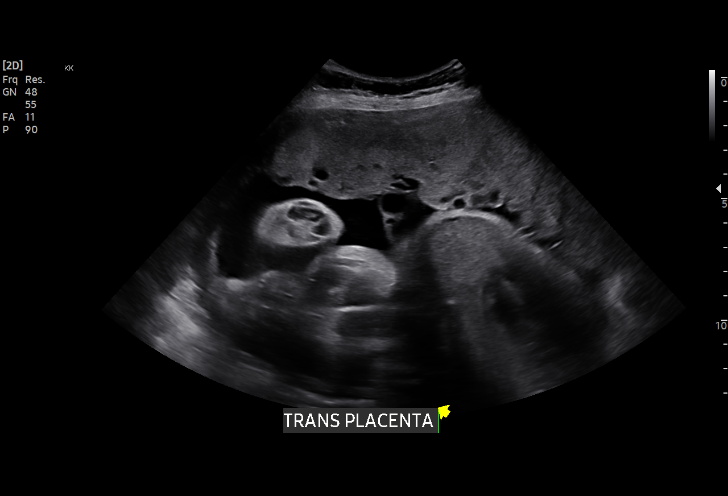
[im 23/41]
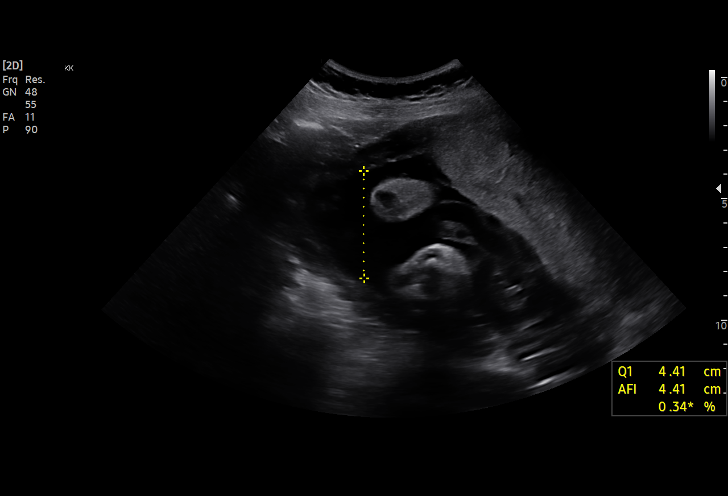
[im 26/41]
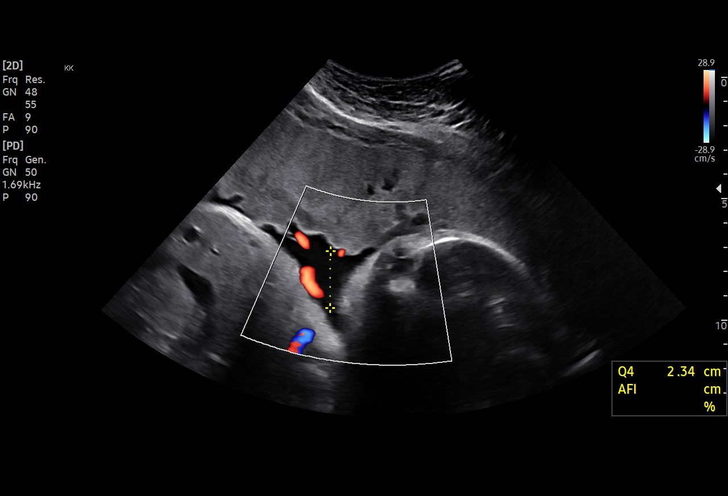
[im 29/41]
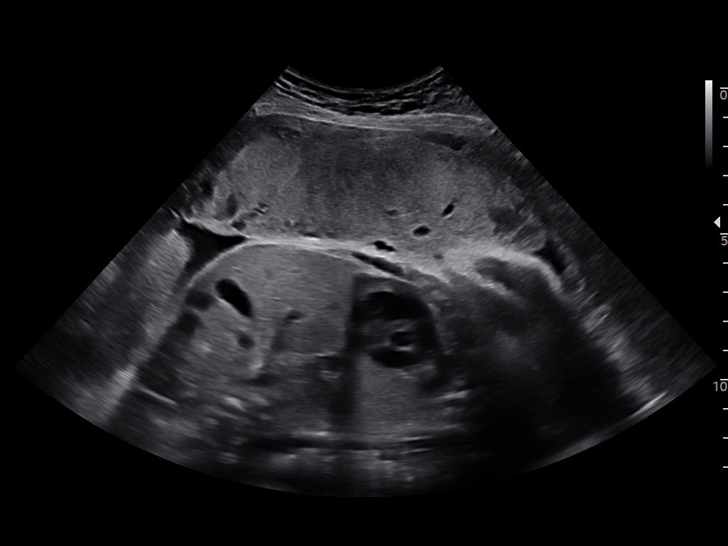
[im 33/41]
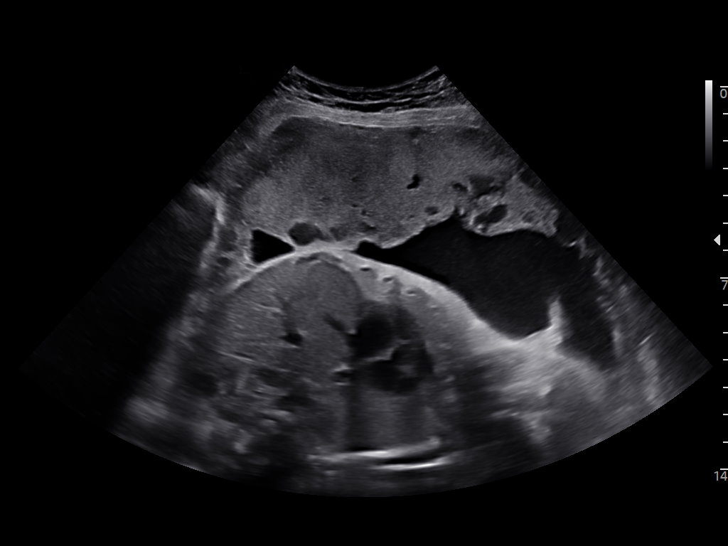
[im 36/41]
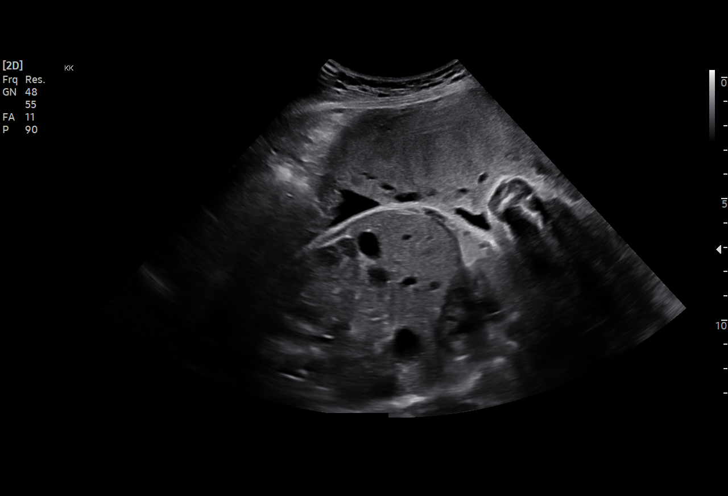
[im 39/41]
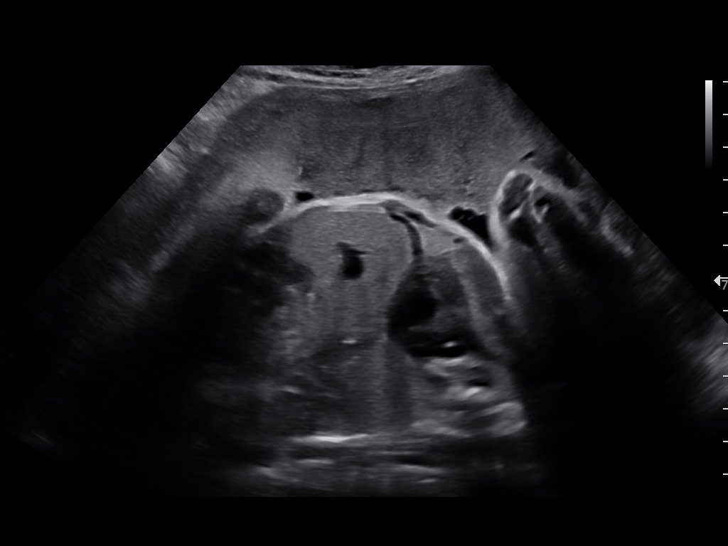

[12 of 28 positions shown; findings below may reference images not displayed]

Addendum:\.br----------------------------------------------------------------------
----------------------------------------------------------------------

----------------------------------------------------------------------

----------------------------------------------------------------------

----------------------------------------------------------------------

----------------------------------------------------------------------
Indications

 Maternal care for known or suspected poor
 fetal growth, third trimester, not applicable or
 unspecified IUGR
 Advanced maternal age primigravida 35+,
 third trimester (35 yrs)
 Hyperemesis gravidarum
 Herpes simplex virus ([place])
 33 weeks gestation of pregnancy
----------------------------------------------------------------------
Fetal Evaluation

 Num Of Fetuses:         1
 Fetal Heart Rate(bpm):  147
 Cardiac Activity:       Observed
 Presentation:           Cephalic
 Placenta:               Anterior
 P. Cord Insertion:      Previously Visualized

 Amniotic Fluid
 AFI FV:      Within normal limits

 AFI Sum(cm)     %Tile       Largest Pocket(cm)
 11.2            27

 RUQ(cm)       RLQ(cm)       LUQ(cm)        LLQ(cm)
 4.4           2.3           2
----------------------------------------------------------------------
Biophysical Evaluation
 Amniotic F.V:   Pocket => 2 cm             F. Tone:        Observed
 F. Movement:    Observed                   Score:          [DATE]
 F. Breathing:   Observed
----------------------------------------------------------------------
OB History

 Gravidity:    7         Term:   4         SAB:   1
 TOP:          1
----------------------------------------------------------------------
Gestational Age

 LMP:           33w 0d        Date:  08/23/20                 EDD:   05/30/21
 Best:          33w 0d     Det. By:  LMP  (08/23/20)          EDD:   05/30/21
----------------------------------------------------------------------
Anatomy

 Diaphragm:             Appears normal         Bladder:                Appears normal
 Stomach:               Appears normal, left
                        sided
----------------------------------------------------------------------
Doppler - Fetal Vessels

 Umbilical Artery
  S/D     %tile      RI    %tile      PI    %tile     PSV    ADFV    RDFV
                                                    (cm/s)
  2.36       34    0.58       40    0.[REDACTED]      No      No

----------------------------------------------------------------------
----------------------------------------------------------------------

*** End of Addendum ***\.br----------------------------------------------------------------------
----------------------------------------------------------------------

----------------------------------------------------------------------

----------------------------------------------------------------------

    W/NONSTRESS
----------------------------------------------------------------------

----------------------------------------------------------------------
Indications

 Maternal care for known or suspected poor
 fetal growth, third trimester, not applicable or
 unspecified IUGR
 Advanced maternal age primigravida 35+,
 third trimester (35 yrs)
 Hyperemesis gravidarum
 Herpes simplex virus (SUNGKYUN)
 33 weeks gestation of pregnancy
----------------------------------------------------------------------
Fetal Evaluation

 Num Of Fetuses:          1
 Fetal Heart Rate(bpm):   147
 Cardiac Activity:        Observed
 Presentation:            Cephalic
 Placenta:                Anterior
 P. Cord Insertion:       Previously Visualized

 Amniotic Fluid
 AFI FV:      Within normal limits

 AFI Sum(cm)     %Tile       Largest Pocket(cm)
 11.2            27

 RUQ(cm)       RLQ(cm)       LUQ(cm)        LLQ(cm)
 4.4           2.3           2
----------------------------------------------------------------------
Biophysical Evaluation
 Amniotic F.V:   Pocket => 2 cm             F. Tone:         Observed
 F. Movement:    Observed                   Score:           [DATE]
 F. Breathing:   Observed
----------------------------------------------------------------------
OB History

 Gravidity:    7         Term:   4         SAB:   1
 TOP:          1
----------------------------------------------------------------------
Gestational Age

 LMP:           33w 0d        Date:  08/23/20                 EDD:   05/30/21
 Best:          33w 0d     Det. By:  LMP  (08/23/20)          EDD:   05/30/21
----------------------------------------------------------------------
Anatomy

 Diaphragm:             Appears normal         Bladder:                Appears normal
 Stomach:               Appears normal, left
                        sided
----------------------------------------------------------------------
Doppler - Fetal Vessels

 Umbilical Artery
   S/D    %tile      RI    %tile      PI    %tile     PSV    ADFV    RDFV
                                                    (cm/s)
  2.36       34    0.58       40    0.[REDACTED]      No      No

----------------------------------------------------------------------
----------------------------------------------------------------------

## 2022-11-09 ENCOUNTER — Telehealth (INDEPENDENT_AMBULATORY_CARE_PROVIDER_SITE_OTHER): Payer: Medicaid Other | Admitting: Psychiatry

## 2022-11-09 ENCOUNTER — Encounter (HOSPITAL_COMMUNITY): Payer: Self-pay | Admitting: Psychiatry

## 2022-11-09 DIAGNOSIS — F411 Generalized anxiety disorder: Secondary | ICD-10-CM | POA: Diagnosis not present

## 2022-11-09 DIAGNOSIS — F331 Major depressive disorder, recurrent, moderate: Secondary | ICD-10-CM | POA: Diagnosis not present

## 2022-11-09 DIAGNOSIS — O99345 Other mental disorders complicating the puerperium: Secondary | ICD-10-CM

## 2022-11-09 MED ORDER — BUPROPION HCL ER (XL) 300 MG PO TB24
300.0000 mg | ORAL_TABLET | Freq: Every day | ORAL | 2 refills | Status: DC
Start: 2022-11-09 — End: 2022-12-29

## 2022-11-09 MED ORDER — BUSPIRONE HCL 10 MG PO TABS: 10.0000 mg | ORAL_TABLET | Freq: Two times a day (BID) | ORAL | 1 refills | Status: DC

## 2022-11-09 MED ORDER — TRAZODONE HCL 100 MG PO TABS: 100.0000 mg | ORAL_TABLET | Freq: Every day | ORAL | Status: DC
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
## 2022-12-28 ENCOUNTER — Encounter (HOSPITAL_COMMUNITY): Payer: Self-pay

## 2022-12-28 ENCOUNTER — Telehealth (HOSPITAL_COMMUNITY): Payer: Medicaid Other | Admitting: Psychiatry

## 2022-12-29 ENCOUNTER — Encounter (HOSPITAL_COMMUNITY): Payer: Self-pay | Admitting: Psychiatry

## 2022-12-29 ENCOUNTER — Telehealth (HOSPITAL_COMMUNITY): Payer: Medicaid Other | Admitting: Psychiatry

## 2022-12-29 DIAGNOSIS — F418 Other specified anxiety disorders: Secondary | ICD-10-CM

## 2022-12-29 DIAGNOSIS — F411 Generalized anxiety disorder: Secondary | ICD-10-CM | POA: Diagnosis not present

## 2022-12-29 DIAGNOSIS — F422 Mixed obsessional thoughts and acts: Secondary | ICD-10-CM

## 2022-12-29 DIAGNOSIS — F331 Major depressive disorder, recurrent, moderate: Secondary | ICD-10-CM

## 2022-12-29 MED ORDER — DULOXETINE HCL 30 MG PO CPEP: 30.0000 mg | ORAL_CAPSULE | Freq: Every day | ORAL | 1 refills | Status: DC

## 2022-12-29 MED ORDER — BUPROPION HCL ER (XL) 300 MG PO TB24
300.0000 mg | ORAL_TABLET | Freq: Every day | ORAL | 2 refills | Status: DC
Start: 2022-12-29 — End: 2023-06-01
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
## 2023-01-29 ENCOUNTER — Telehealth (HOSPITAL_COMMUNITY): Payer: Self-pay | Admitting: *Deleted

## 2023-01-29 NOTE — Telephone Encounter (Signed)
PATIENT STATED THAT CYMBALTA IS MAKING HER SICK & DHE TAKES WITH FOOD & HAS TIRED TAKING DIFFERENT TIMES THROUGH OUT THE DAY  & STILL FEELS ILL.   ASKED IF THERE IS ANT TIME OR WAY SHE SHOULD TRY TAKING THE CYMBALTA??   NOTED HSE'S ALSO TAKING : BUSPAR & WELLBUTRIN

## 2023-02-01 NOTE — Telephone Encounter (Signed)
She has used it before. Does she want me to send a lower dose of 20mg / ? Also any other med she has used to consider   Has Tried::Cymbalta --Buspar--Wellbutrin

## 2023-02-02 NOTE — Progress Notes (Deleted)
Opened in error

## 2023-02-02 NOTE — Telephone Encounter (Signed)
LVM  We can send lower dose of cymbalta 20mg  , consider Gene testing. Let patient know if interested

## 2023-02-03 ENCOUNTER — Ambulatory Visit (INDEPENDENT_AMBULATORY_CARE_PROVIDER_SITE_OTHER): Payer: Medicaid Other | Admitting: Certified Nurse Midwife

## 2023-02-03 ENCOUNTER — Encounter: Payer: Self-pay | Admitting: Certified Nurse Midwife

## 2023-02-03 ENCOUNTER — Other Ambulatory Visit: Payer: Self-pay

## 2023-02-03 VITALS — BP 122/86 | HR 81 | Ht 64.5 in | Wt 137.0 lb

## 2023-02-03 DIAGNOSIS — B009 Herpesviral infection, unspecified: Secondary | ICD-10-CM

## 2023-02-03 DIAGNOSIS — Z1159 Encounter for screening for other viral diseases: Secondary | ICD-10-CM

## 2023-02-03 DIAGNOSIS — Z01419 Encounter for gynecological examination (general) (routine) without abnormal findings: Secondary | ICD-10-CM | POA: Diagnosis not present

## 2023-02-03 DIAGNOSIS — F419 Anxiety disorder, unspecified: Secondary | ICD-10-CM | POA: Diagnosis not present

## 2023-02-03 DIAGNOSIS — F32A Depression, unspecified: Secondary | ICD-10-CM | POA: Diagnosis not present

## 2023-02-03 MED ORDER — VALACYCLOVIR HCL 500 MG PO TABS
1000.0000 mg | ORAL_TABLET | Freq: Every day | ORAL | 4 refills | Status: AC
Start: 2023-02-03 — End: ?

## 2023-02-03 NOTE — Progress Notes (Signed)
ANNUAL EXAM Patient name: Breanna [name] MRN [id number]  Date of birth: Feb 14, 1986 Chief Complaint:   Gynecologic Exam  History of Present Illness:   Breanna [name] is a 37 y.o. [id number] female being seen today for a routine annual exam.  Current complaints: None  No LMP recorded.   Upstream - 02/03/23 0845       Pregnancy Intention Screening   Does the patient want to become pregnant in the next year? No    Does the patient's partner want to become pregnant in the next year? No    Would the patient like to discuss contraceptive options today? No      Contraception Wrap Up   Current Method Vasectomy    End Method Vasectomy    Contraception Counseling Provided No           The pregnancy intention screening data noted above was reviewed. Potential methods of contraception were discussed. The patient elected to proceed with Vasectomy.   Last pap 01/03/2019. Results were: NILM w/ HRHPV negative. H/O abnormal pap: no Last mammogram: Not indicated. Results were: N/A. Family h/o breast cancer: no Last colonoscopy: Not indicated. Results were: N/A. Family h/o colorectal cancer: no STI testing: Declines Anxiety/Depression: On Buspirone, Bupropion, Cymbalta, discontinued Trazodone. 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 No outbreaks for one year.  Contraception: Husband with vasectomy      02/03/2023    8:45 AM 08/27/2021    9:29 AM 07/10/2021   11:14 AM 06/20/2021    8:49 AM 04/10/2021    9:27 AM  Depression screen PHQ 2/9  Decreased Interest 2 1 0 1 2  Down, Depressed, Hopeless 2 0 1 3 2   PHQ - 2 Score 4 1 1 4 4   Altered sleeping 3 3 3 3 3   Tired, decreased energy 3 3 2 3 3   Change in appetite 3 1 2  0 3  Feeling bad or failure about yourself  2 3 2 1 3   Trouble concentrating 2 3 2 3 3   Moving slowly or fidgety/restless 2 3 2 3 3   Suicidal thoughts 0 0 1 0 0  PHQ-9 Score 19 17 15 17 22         02/03/2023     8:45 AM 06/20/2021    8:51 AM 04/10/2021    9:27 AM 02/21/2021    9:09 AM  GAD 7 : Generalized Anxiety Score  Nervous, Anxious, on Edge 3 3 3 3   Control/stop worrying 3 3 3 3   Worry too much - different things 3 3 3 3   Trouble relaxing 3 3 3 3   Restless 2 3 2 3   Easily annoyed or irritable 3 3 3 3   Afraid - awful might happen 3 3 3 3   Total GAD 7 Score 20 21 20 21      Review of Systems:   Pertinent items are noted in HPI Denies any headaches, blurred vision, fatigue, shortness of breath, chest pain, abdominal pain, abnormal vaginal discharge/itching/odor/irritation, problems with periods, bowel movements, urination, or intercourse unless otherwise stated above. Pertinent History Reviewed:  Reviewed past medical,surgical, social and family history.  Reviewed problem list, medications and allergies. Physical Assessment:   Vitals:   02/03/23 0843  BP: 122/86  Pulse: 81  Weight: 137 lb (62.1 kg)  Height: 5' 4.5" (1.638 m)   Body mass index is 23.15 kg/m.        Physical Examination:   General appearance - well appearing, and  in no distress  Mental status - alert, oriented to person, place, and time  Psych:  She has a normal mood and affect  Skin - warm and dry, normal color, no suspicious lesions noted  Chest - effort normal, all lung fields clear to auscultation bilaterally  Heart - normal rate and regular rhythm  Neck:  midline trachea, no thyromegaly or nodules  Breasts - Deferred.  Abdomen - soft, nontender, nondistended, no masses or organomegaly  Pelvic - Deferred.    No results found for this or any previous visit (from the past 24 hours).  Assessment & Plan:      There are no diagnoses linked to this encounter.  1. Encounter for annual routine gynecological examination (Primary) -Patient stable and doing well, vitals wnl -Last pap smear 01/03/2019 (NILM HPV neg), repeat 12/2023 -Plan for mammogram at age 7 or sooner if indicated  -Plan for colonoscopy at age  67 or sooner if indicated -Vaccinations reviewed; discussed Tdap, flu  -Routine screening labs: - TSH - CBC - Hemoglobin A1c - Comp Met (CMET) - Lipid panel - Vitamin D (25 hydroxy)  2. Anxiety and depression - Continue Buspirone, Wellbutrin, Cymbalta  - Follows Psychiatry, last seen one month ago   3. ELISA positive for herpes simplex virus - Taking prophylaxis when symptomatic. Refill needed.  - valACYclovir (VALTREX) 500 MG tablet; Take 2 tablets (1,000 mg total) by mouth daily.  Dispense: 180 tablet; Refill: 4   Routine preventative health maintenance measures emphasized. Please refer to After Visit Summary for other counseling recommendations.       Orders Placed This Encounter  Procedures   TSH   CBC   Comp Met (CMET)   Vitamin D (25 hydroxy)   Lipid panel   Hemoglobin A1c    Meds:  Meds ordered this encounter  Medications   valACYclovir (VALTREX) 500 MG tablet    Sig: Take 2 tablets (1,000 mg total) by mouth daily.    Dispense:  180 tablet    Refill:  4    Follow-up: Return in about 1 year (around 02/03/2024) for ANN or sooner as needed.  Ralene Muskrat, [place] 02/03/2023 10:29 AM

## 2023-02-04 LAB — LIPID PANEL
Chol/HDL Ratio: 2.9 {ratio} (ref 0.0–4.4)
Cholesterol, Total: 185 mg/dL (ref 100–199)
HDL: 63 mg/dL (ref >39)
LDL Chol Calc (NIH): 110 mg/dL — ABNORMAL HIGH (ref 0–99)
Triglycerides: 63 mg/dL (ref 0–149)
VLDL Cholesterol Cal: 12 mg/dL (ref 5–40)

## 2023-02-04 LAB — CBC
Hematocrit: 38.2 % (ref 34.0–46.6)
Hemoglobin: 11.6 g/dL (ref 11.1–15.9)
MCH: 21.9 pg — ABNORMAL LOW (ref 26.6–33.0)
MCHC: 30.4 g/dL — ABNORMAL LOW (ref 31.5–35.7)
MCV: 72 fL — ABNORMAL LOW (ref 79–97)
Platelets: 326 x10E3/uL (ref 150–450)
RBC: 5.3 x10E6/uL — ABNORMAL HIGH (ref 3.77–5.28)
RDW: 14.2 % (ref 11.7–15.4)
WBC: 4.2 x10E3/uL (ref 3.4–10.8)

## 2023-02-04 LAB — COMPREHENSIVE METABOLIC PANEL WITH GFR
ALT: 6 IU/L (ref 0–32)
AST: 13 IU/L (ref 0–40)
Albumin: 4.7 g/dL (ref 3.9–4.9)
Alkaline Phosphatase: 134 IU/L — ABNORMAL HIGH (ref 44–121)
BUN/Creatinine Ratio: 13 (ref 9–23)
BUN: 12 mg/dL (ref 6–20)
Bilirubin Total: <0.2 mg/dL (ref 0.0–1.2)
CO2: 20 mmol/L (ref 20–29)
Calcium: 10.1 mg/dL (ref 8.7–10.2)
Chloride: 104 mmol/L (ref 96–106)
Creatinine, Ser: 0.94 mg/dL (ref 0.57–1.00)
Globulin, Total: 3.2 g/dL (ref 1.5–4.5)
Glucose: 84 mg/dL (ref 70–99)
Potassium: 4.2 mmol/L (ref 3.5–5.2)
Sodium: 139 mmol/L (ref 134–144)
Total Protein: 7.9 g/dL (ref 6.0–8.5)
eGFR: 80 mL/min/1.73

## 2023-02-04 LAB — VITAMIN D 25 HYDROXY (VIT D DEFICIENCY, FRACTURES): Vit D, 25-Hydroxy: 22.7 ng/mL — ABNORMAL LOW (ref 30.0–100.0)

## 2023-02-04 LAB — TSH: TSH: 4.12 u[IU]/mL (ref 0.450–4.500)

## 2023-02-04 LAB — HEMOGLOBIN A1C
Est. average glucose Bld gHb Est-mCnc: 114 mg/dL
Hgb A1c MFr Bld: 5.6 % (ref 4.8–5.6)

## 2023-03-01 ENCOUNTER — Telehealth (HOSPITAL_COMMUNITY): Payer: Medicaid Other | Admitting: Psychiatry

## 2023-03-01 ENCOUNTER — Encounter (HOSPITAL_COMMUNITY): Payer: Self-pay | Admitting: Psychiatry

## 2023-03-01 DIAGNOSIS — F331 Major depressive disorder, recurrent, moderate: Secondary | ICD-10-CM | POA: Diagnosis not present

## 2023-03-01 DIAGNOSIS — O99345 Other mental disorders complicating the puerperium: Secondary | ICD-10-CM

## 2023-03-01 DIAGNOSIS — F422 Mixed obsessional thoughts and acts: Secondary | ICD-10-CM | POA: Diagnosis not present

## 2023-03-01 DIAGNOSIS — F411 Generalized anxiety disorder: Secondary | ICD-10-CM | POA: Diagnosis not present

## 2023-03-01 DIAGNOSIS — F418 Other specified anxiety disorders: Secondary | ICD-10-CM | POA: Diagnosis not present
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
## 2023-04-09 ENCOUNTER — Encounter (HOSPITAL_COMMUNITY): Payer: Self-pay

## 2023-05-03 ENCOUNTER — Telehealth (INDEPENDENT_AMBULATORY_CARE_PROVIDER_SITE_OTHER): Payer: Medicaid Other | Admitting: Psychiatry

## 2023-05-03 ENCOUNTER — Encounter (HOSPITAL_COMMUNITY): Payer: Self-pay | Admitting: Psychiatry

## 2023-05-03 DIAGNOSIS — F411 Generalized anxiety disorder: Secondary | ICD-10-CM

## 2023-05-03 DIAGNOSIS — F422 Mixed obsessional thoughts and acts: Secondary | ICD-10-CM | POA: Diagnosis not present

## 2023-05-03 DIAGNOSIS — F331 Major depressive disorder, recurrent, moderate: Secondary | ICD-10-CM

## 2023-05-03 MED ORDER — DULOXETINE HCL 20 MG PO CPEP: 20.0000 mg | ORAL_CAPSULE | Freq: Every day | ORAL | 0 refills | Status: DC
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
## 2023-06-01 ENCOUNTER — Encounter (HOSPITAL_COMMUNITY): Payer: Self-pay | Admitting: Psychiatry

## 2023-06-01 ENCOUNTER — Ambulatory Visit (HOSPITAL_COMMUNITY): Admitting: Psychiatry

## 2023-06-01 ENCOUNTER — Ambulatory Visit (INDEPENDENT_AMBULATORY_CARE_PROVIDER_SITE_OTHER): Admitting: Psychiatry

## 2023-06-01 DIAGNOSIS — F331 Major depressive disorder, recurrent, moderate: Secondary | ICD-10-CM

## 2023-06-01 DIAGNOSIS — F422 Mixed obsessional thoughts and acts: Secondary | ICD-10-CM | POA: Diagnosis not present

## 2023-06-01 DIAGNOSIS — F411 Generalized anxiety disorder: Secondary | ICD-10-CM | POA: Diagnosis not present

## 2023-06-01 DIAGNOSIS — F418 Other specified anxiety disorders: Secondary | ICD-10-CM

## 2023-06-01 MED ORDER — BUPROPION HCL ER (XL) 300 MG PO TB24
300.0000 mg | ORAL_TABLET | Freq: Every day | ORAL | 2 refills | Status: DC
Start: 2023-06-01 — End: 2023-07-19

## 2023-06-01 MED ORDER — BUSPIRONE HCL 5 MG PO TABS: 5.0000 mg | ORAL_TABLET | Freq: Two times a day (BID) | ORAL | 1 refills | Status: DC
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
## 2023-06-23 ENCOUNTER — Telehealth (HOSPITAL_COMMUNITY): Admitting: Psychiatry

## 2023-07-19 ENCOUNTER — Telehealth (INDEPENDENT_AMBULATORY_CARE_PROVIDER_SITE_OTHER): Admitting: Psychiatry

## 2023-07-19 ENCOUNTER — Encounter (HOSPITAL_COMMUNITY): Payer: Self-pay | Admitting: Psychiatry

## 2023-07-19 DIAGNOSIS — F331 Major depressive disorder, recurrent, moderate: Secondary | ICD-10-CM

## 2023-07-19 DIAGNOSIS — F411 Generalized anxiety disorder: Secondary | ICD-10-CM

## 2023-07-19 DIAGNOSIS — O99345 Other mental disorders complicating the puerperium: Secondary | ICD-10-CM

## 2023-07-19 MED ORDER — BUPROPION HCL ER (XL) 300 MG PO TB24: 300.0000 mg | ORAL_TABLET | Freq: Every day | ORAL | 2 refills | Status: DC
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
## 2023-09-15 ENCOUNTER — Telehealth (HOSPITAL_COMMUNITY): Payer: Self-pay | Admitting: Psychiatry

## 2023-09-15 DIAGNOSIS — O99345 Other mental disorders complicating the puerperium: Secondary | ICD-10-CM

## 2023-09-15 MED ORDER — BUPROPION HCL ER (XL) 300 MG PO TB24: 300.0000 mg | ORAL_TABLET | Freq: Every day | ORAL | 1 refills | Status: DC

## 2023-09-15 NOTE — Telephone Encounter (Signed)
 Patient called requesting refill of  buPROPion  (WELLBUTRIN  XL) 300 MG 24 hr tablet .  [id number] - [place],  - [address] AT [address] (Ph: [phone])   Last ordered: 07/19/2023 - 30 tablets with 2 refills Last visit: 07/19/2023 Next visit: 09/20/2023  Patient informed that her record reflects she should have a refill remaining however she stated pharmacy indicated there was none and had sent a request to provider's office which would not be available until Monday. Patient stated she is out of this medication. Called the pharmacy and pharmacist indicated there is a refill on file and they are working on filling it.

## 2023-09-20 ENCOUNTER — Encounter (HOSPITAL_COMMUNITY): Payer: Self-pay | Admitting: Psychiatry

## 2023-09-20 ENCOUNTER — Telehealth (INDEPENDENT_AMBULATORY_CARE_PROVIDER_SITE_OTHER): Admitting: Psychiatry

## 2023-09-20 DIAGNOSIS — O99345 Other mental disorders complicating the puerperium: Secondary | ICD-10-CM

## 2023-09-20 DIAGNOSIS — F331 Major depressive disorder, recurrent, moderate: Secondary | ICD-10-CM | POA: Diagnosis not present

## 2023-09-20 DIAGNOSIS — F422 Mixed obsessional thoughts and acts: Secondary | ICD-10-CM | POA: Diagnosis not present

## 2023-09-20 DIAGNOSIS — F411 Generalized anxiety disorder: Secondary | ICD-10-CM | POA: Diagnosis not present

## 2023-09-20 DIAGNOSIS — F418 Other specified anxiety disorders: Secondary | ICD-10-CM

## 2023-09-20 MED ORDER — BUSPIRONE HCL 5 MG PO TABS: 5.0000 mg | ORAL_TABLET | Freq: Two times a day (BID) | ORAL | 1 refills | Status: DC
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
## 2023-11-22 ENCOUNTER — Encounter (HOSPITAL_COMMUNITY): Payer: Self-pay | Admitting: Psychiatry

## 2023-11-22 ENCOUNTER — Telehealth (INDEPENDENT_AMBULATORY_CARE_PROVIDER_SITE_OTHER): Admitting: Psychiatry

## 2023-11-22 DIAGNOSIS — F422 Mixed obsessional thoughts and acts: Secondary | ICD-10-CM

## 2023-11-22 DIAGNOSIS — G4700 Insomnia, unspecified: Secondary | ICD-10-CM

## 2023-11-22 DIAGNOSIS — F331 Major depressive disorder, recurrent, moderate: Secondary | ICD-10-CM | POA: Diagnosis not present

## 2023-11-22 DIAGNOSIS — F411 Generalized anxiety disorder: Secondary | ICD-10-CM | POA: Diagnosis not present

## 2023-11-22 DIAGNOSIS — F418 Other specified anxiety disorders: Secondary | ICD-10-CM

## 2023-11-22 MED ORDER — BUSPIRONE HCL 5 MG PO TABS: 5.0000 mg | ORAL_TABLET | Freq: Two times a day (BID) | ORAL | 1 refills | Status: DC

## 2023-11-22 MED ORDER — BUPROPION HCL ER (XL) 300 MG PO TB24: 300.0000 mg | ORAL_TABLET | Freq: Every day | ORAL | 1 refills | Status: DC
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
## 2024-01-24 ENCOUNTER — Telehealth (HOSPITAL_COMMUNITY): Admitting: Psychiatry

## 2024-01-24 ENCOUNTER — Encounter (HOSPITAL_COMMUNITY): Payer: Self-pay | Admitting: Psychiatry

## 2024-01-24 DIAGNOSIS — F331 Major depressive disorder, recurrent, moderate: Secondary | ICD-10-CM

## 2024-01-24 DIAGNOSIS — F418 Other specified anxiety disorders: Secondary | ICD-10-CM

## 2024-01-24 DIAGNOSIS — F411 Generalized anxiety disorder: Secondary | ICD-10-CM

## 2024-01-24 DIAGNOSIS — F422 Mixed obsessional thoughts and acts: Secondary | ICD-10-CM

## 2024-01-24 MED ORDER — BUSPIRONE HCL 5 MG PO TABS: 5.0000 mg | ORAL_TABLET | Freq: Two times a day (BID) | ORAL | 1 refills | Status: AC

## 2024-01-24 MED ORDER — BUPROPION HCL ER (XL) 300 MG PO TB24: 300.0000 mg | ORAL_TABLET | Freq: Every day | ORAL | 1 refills | Status: AC
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
## 2024-02-16 ENCOUNTER — Ambulatory Visit: Admitting: Certified Nurse Midwife

## 2024-04-05 ENCOUNTER — Ambulatory Visit: Payer: Self-pay | Admitting: Certified Nurse Midwife

## 2024-05-01 ENCOUNTER — Telehealth (HOSPITAL_COMMUNITY): Admitting: Psychiatry
